# Patient Record
Sex: Female | Born: 1935 | Race: White | Hispanic: No | Marital: Married | Smoking: Former smoker
Health system: Southern US, Community
[De-identification: ages and names within clinical notes are randomized; demographics above are authoritative.]

## PROBLEM LIST (undated history)

## (undated) DIAGNOSIS — M720 Palmar fascial fibromatosis [Dupuytren]: Secondary | ICD-10-CM

## (undated) DIAGNOSIS — E669 Obesity, unspecified: Secondary | ICD-10-CM

## (undated) DIAGNOSIS — M1990 Unspecified osteoarthritis, unspecified site: Secondary | ICD-10-CM

## (undated) DIAGNOSIS — E785 Hyperlipidemia, unspecified: Secondary | ICD-10-CM

## (undated) DIAGNOSIS — N183 Chronic kidney disease, stage 3 unspecified: Secondary | ICD-10-CM

## (undated) DIAGNOSIS — F4320 Adjustment disorder, unspecified: Secondary | ICD-10-CM

## (undated) DIAGNOSIS — R05 Cough: Secondary | ICD-10-CM

## (undated) DIAGNOSIS — E7800 Pure hypercholesterolemia, unspecified: Secondary | ICD-10-CM

## (undated) DIAGNOSIS — R059 Cough, unspecified: Secondary | ICD-10-CM

## (undated) DIAGNOSIS — I10 Essential (primary) hypertension: Secondary | ICD-10-CM

## (undated) DIAGNOSIS — R002 Palpitations: Secondary | ICD-10-CM

## (undated) DIAGNOSIS — R32 Unspecified urinary incontinence: Secondary | ICD-10-CM

## (undated) DIAGNOSIS — G609 Hereditary and idiopathic neuropathy, unspecified: Secondary | ICD-10-CM

## (undated) DIAGNOSIS — K5190 Ulcerative colitis, unspecified, without complications: Secondary | ICD-10-CM

## (undated) DIAGNOSIS — G4730 Sleep apnea, unspecified: Secondary | ICD-10-CM

## (undated) DIAGNOSIS — E038 Other specified hypothyroidism: Secondary | ICD-10-CM

## (undated) DIAGNOSIS — G629 Polyneuropathy, unspecified: Secondary | ICD-10-CM

## (undated) DIAGNOSIS — R600 Localized edema: Secondary | ICD-10-CM

## (undated) DIAGNOSIS — N2581 Secondary hyperparathyroidism of renal origin: Secondary | ICD-10-CM

## (undated) DIAGNOSIS — F419 Anxiety disorder, unspecified: Secondary | ICD-10-CM

## (undated) DIAGNOSIS — H6090 Unspecified otitis externa, unspecified ear: Secondary | ICD-10-CM

## (undated) HISTORY — DX: Chronic kidney disease, stage 3 (moderate): N18.3

## (undated) HISTORY — DX: Palpitations: R00.2

## (undated) HISTORY — DX: Hyperlipidemia, unspecified: E78.5

## (undated) HISTORY — DX: Sleep apnea, unspecified: G47.30

## (undated) HISTORY — DX: Obesity, unspecified: E66.9

## (undated) HISTORY — DX: Polyneuropathy, unspecified: G62.9

## (undated) HISTORY — DX: Unspecified osteoarthritis, unspecified site: M19.90

## (undated) HISTORY — DX: Cough: R05

## (undated) HISTORY — DX: Chronic kidney disease, stage 3 unspecified: N18.30

## (undated) HISTORY — PX: COLONOSCOPY: SHX174

## (undated) HISTORY — DX: Secondary hyperparathyroidism of renal origin: N25.81

## (undated) HISTORY — DX: Anxiety disorder, unspecified: F41.9

## (undated) HISTORY — DX: Unspecified urinary incontinence: R32

## (undated) HISTORY — DX: Adjustment disorder, unspecified: F43.20

## (undated) HISTORY — DX: Other specified hypothyroidism: E03.8

## (undated) HISTORY — DX: Cough, unspecified: R05.9

## (undated) HISTORY — DX: Unspecified otitis externa, unspecified ear: H60.90

## (undated) HISTORY — DX: Hereditary and idiopathic neuropathy, unspecified: G60.9

## (undated) HISTORY — PX: REPLACEMENT TOTAL KNEE: SUR1224

## (undated) HISTORY — DX: Pure hypercholesterolemia, unspecified: E78.00

## (undated) HISTORY — DX: Essential (primary) hypertension: I10

## (undated) HISTORY — DX: Palmar fascial fibromatosis (dupuytren): M72.0

---

## 1988-02-13 HISTORY — PX: BACK SURGERY: SHX140

## 1997-02-12 HISTORY — PX: HAND SURGERY: SHX662

## 2003-10-11 ENCOUNTER — Other Ambulatory Visit: Payer: Self-pay

## 2003-11-13 ENCOUNTER — Encounter: Payer: Self-pay | Admitting: Internal Medicine

## 2003-11-16 ENCOUNTER — Encounter: Payer: Self-pay | Admitting: Unknown Physician Specialty

## 2003-12-14 ENCOUNTER — Encounter: Payer: Self-pay | Admitting: Unknown Physician Specialty

## 2005-09-03 ENCOUNTER — Encounter: Admission: RE | Admit: 2005-09-03 | Discharge: 2005-09-03 | Payer: Self-pay | Admitting: Orthopedic Surgery

## 2005-09-06 ENCOUNTER — Ambulatory Visit (HOSPITAL_BASED_OUTPATIENT_CLINIC_OR_DEPARTMENT_OTHER): Admission: RE | Admit: 2005-09-06 | Discharge: 2005-09-06 | Payer: Self-pay | Admitting: Orthopedic Surgery

## 2005-09-06 ENCOUNTER — Encounter (INDEPENDENT_AMBULATORY_CARE_PROVIDER_SITE_OTHER): Payer: Self-pay | Admitting: *Deleted

## 2006-02-06 ENCOUNTER — Other Ambulatory Visit: Payer: Self-pay

## 2006-02-19 ENCOUNTER — Inpatient Hospital Stay: Payer: Self-pay | Admitting: Unknown Physician Specialty

## 2006-02-23 ENCOUNTER — Encounter: Payer: Self-pay | Admitting: Internal Medicine

## 2006-04-02 ENCOUNTER — Other Ambulatory Visit: Payer: Self-pay

## 2006-04-02 ENCOUNTER — Inpatient Hospital Stay: Payer: Self-pay | Admitting: Internal Medicine

## 2006-09-30 ENCOUNTER — Ambulatory Visit: Payer: Self-pay | Admitting: Family Medicine

## 2006-10-03 ENCOUNTER — Ambulatory Visit: Payer: Self-pay | Admitting: Family Medicine

## 2007-05-15 ENCOUNTER — Ambulatory Visit: Payer: Self-pay | Admitting: Orthopaedic Surgery

## 2007-05-21 ENCOUNTER — Encounter: Payer: Self-pay | Admitting: Cardiovascular Disease

## 2007-05-21 LAB — CONVERTED CEMR LAB
Cholesterol: 217 mg/dL
LDL Cholesterol: 131 mg/dL
Triglyceride fasting, serum: 217 mg/dL

## 2007-06-13 HISTORY — PX: SHOULDER SURGERY: SHX246

## 2007-06-17 ENCOUNTER — Ambulatory Visit: Payer: Self-pay | Admitting: Orthopaedic Surgery

## 2007-06-24 ENCOUNTER — Ambulatory Visit: Payer: Self-pay | Admitting: Orthopaedic Surgery

## 2007-11-18 ENCOUNTER — Ambulatory Visit: Payer: Self-pay | Admitting: Family Medicine

## 2008-01-12 ENCOUNTER — Ambulatory Visit: Payer: Self-pay | Admitting: Nephrology

## 2008-03-15 HISTORY — PX: CARDIAC CATHETERIZATION: SHX172

## 2008-04-08 ENCOUNTER — Inpatient Hospital Stay (HOSPITAL_COMMUNITY): Admission: EM | Admit: 2008-04-08 | Discharge: 2008-04-11 | Payer: Self-pay | Admitting: Emergency Medicine

## 2008-10-15 ENCOUNTER — Inpatient Hospital Stay: Payer: Self-pay | Admitting: Internal Medicine

## 2008-12-16 ENCOUNTER — Ambulatory Visit: Payer: Self-pay | Admitting: Family Medicine

## 2009-02-16 ENCOUNTER — Encounter: Payer: Self-pay | Admitting: Cardiovascular Disease

## 2009-02-18 ENCOUNTER — Ambulatory Visit: Payer: Self-pay | Admitting: Family Medicine

## 2009-08-02 ENCOUNTER — Encounter: Payer: Self-pay | Admitting: Cardiovascular Disease

## 2009-08-02 DIAGNOSIS — E663 Overweight: Secondary | ICD-10-CM

## 2009-08-02 DIAGNOSIS — I10 Essential (primary) hypertension: Secondary | ICD-10-CM

## 2010-02-21 ENCOUNTER — Ambulatory Visit: Payer: Self-pay | Admitting: Family Medicine

## 2010-05-30 LAB — DIFFERENTIAL
Basophils Absolute: 0 10*3/uL (ref 0.0–0.1)
Basophils Relative: 0 % (ref 0–1)
Eosinophils Absolute: 0.4 10*3/uL (ref 0.0–0.7)
Eosinophils Relative: 4 % (ref 0–5)
Monocytes Absolute: 0.5 10*3/uL (ref 0.1–1.0)
Neutro Abs: 7.3 10*3/uL (ref 1.7–7.7)
Neutrophils Relative %: 74 % (ref 43–77)

## 2010-05-30 LAB — LIPID PANEL
Cholesterol: 147 mg/dL (ref 0–200)
HDL: 21 mg/dL — ABNORMAL LOW (ref >39)
Total CHOL/HDL Ratio: 7 RATIO
Triglycerides: 282 mg/dL — ABNORMAL HIGH (ref <150)
VLDL: 56 mg/dL — ABNORMAL HIGH (ref 0–40)

## 2010-05-30 LAB — HEPARIN LEVEL (UNFRACTIONATED): Heparin Unfractionated: <0.1 IU/mL — ABNORMAL LOW (ref 0.30–0.70)

## 2010-05-30 LAB — COMPREHENSIVE METABOLIC PANEL
Albumin: 3.8 g/dL (ref 3.5–5.2)
Alkaline Phosphatase: 69 U/L (ref 39–117)
Calcium: 9.5 mg/dL (ref 8.4–10.5)
Chloride: 101 mEq/L (ref 96–112)
GFR calc Af Amer: 43 mL/min — ABNORMAL LOW (ref >60)
GFR calc non Af Amer: 35 mL/min — ABNORMAL LOW (ref >60)
Potassium: 4.2 mEq/L (ref 3.5–5.1)
Sodium: 139 mEq/L (ref 135–145)
Total Bilirubin: 0.6 mg/dL (ref 0.3–1.2)

## 2010-05-30 LAB — CBC
HCT: 34.1 % — ABNORMAL LOW (ref 36.0–46.0)
HCT: 35.3 % — ABNORMAL LOW (ref 36.0–46.0)
HCT: 39 % (ref 36.0–46.0)
Hemoglobin: 12.2 g/dL (ref 12.0–15.0)
Hemoglobin: 12.3 g/dL (ref 12.0–15.0)
Hemoglobin: 13.2 g/dL (ref 12.0–15.0)
MCHC: 33.9 g/dL (ref 30.0–36.0)
MCHC: 34.7 g/dL (ref 30.0–36.0)
MCV: 90.5 fL (ref 78.0–100.0)
Platelets: 134 10*3/uL — ABNORMAL LOW (ref 150–400)
Platelets: 135 10*3/uL — ABNORMAL LOW (ref 150–400)
RBC: 3.97 MIL/uL (ref 3.87–5.11)
RDW: 14.5 % (ref 11.5–15.5)
RDW: 15 % (ref 11.5–15.5)
RDW: 15 % (ref 11.5–15.5)
WBC: 6 10*3/uL (ref 4.0–10.5)

## 2010-05-30 LAB — BASIC METABOLIC PANEL
BUN: 25 mg/dL — ABNORMAL HIGH (ref 6–23)
BUN: 30 mg/dL — ABNORMAL HIGH (ref 6–23)
CO2: 26 mEq/L (ref 19–32)
Creatinine, Ser: 1.63 mg/dL — ABNORMAL HIGH (ref 0.4–1.2)
Creatinine, Ser: 1.64 mg/dL — ABNORMAL HIGH (ref 0.4–1.2)
GFR calc Af Amer: 37 mL/min — ABNORMAL LOW (ref >60)
GFR calc non Af Amer: 31 mL/min — ABNORMAL LOW (ref >60)
GFR calc non Af Amer: 31 mL/min — ABNORMAL LOW (ref >60)
Glucose, Bld: 101 mg/dL — ABNORMAL HIGH (ref 70–99)
Glucose, Bld: 107 mg/dL — ABNORMAL HIGH (ref 70–99)
Glucose, Bld: 97 mg/dL (ref 70–99)
Potassium: 3.9 mEq/L (ref 3.5–5.1)
Potassium: 3.9 mEq/L (ref 3.5–5.1)
Potassium: 4.2 mEq/L (ref 3.5–5.1)
Sodium: 137 mEq/L (ref 135–145)

## 2010-05-30 LAB — T4, FREE: Free T4: 0.94 ng/dL (ref 0.89–1.80)

## 2010-05-30 LAB — TROPONIN I
Troponin I: 0.01 ng/mL (ref 0.00–0.06)
Troponin I: <0.01 ng/mL (ref 0.00–0.06)

## 2010-05-30 LAB — CK TOTAL AND CKMB (NOT AT ARMC)
CK, MB: 1 ng/mL (ref 0.3–4.0)
Total CK: 64 U/L (ref 7–177)

## 2010-05-30 LAB — GLUCOSE, CAPILLARY
Glucose-Capillary: 122 mg/dL — ABNORMAL HIGH (ref 70–99)
Glucose-Capillary: 139 mg/dL — ABNORMAL HIGH (ref 70–99)
Glucose-Capillary: 159 mg/dL — ABNORMAL HIGH (ref 70–99)

## 2010-05-30 LAB — TSH: TSH: 7.043 u[IU]/mL — ABNORMAL HIGH (ref 0.350–4.500)

## 2010-06-27 NOTE — Cardiovascular Report (Signed)
NAMELEILYNN, PILAT                ACCOUNT NO.:  [id number]   MEDICAL RECORD NO.:  [id number]          PATIENT TYPE:  INP   LOCATION:  1683                         FACILITY:  [facility]   PHYSICIAN:  Quay Burow, M.D.   DATE OF BIRTH:  Oct 07, 1930   DATE OF PROCEDURE:  04/09/2008  DATE OF DISCHARGE:                            CARDIAC CATHETERIZATION   Ms. Evinger is a delightful 77 year old mildly overweight, married white  female with history of hypertension, non-insulin-requiring diabetes, and  hyperlipidemia.  She was admitted yesterday with progressive dyspnea and  a band-like discomfort around her chest.  Her enzymes were negative.  Her creatinine was about 1.6 and she was treated with contrast  nephropathy prophylaxis.  She presents now for diagnostic coronary  arteriography to define her anatomy and rule out ischemic etiology.   DESCRIPTION OF PROCEDURE:  The patient was brought to the second floor  [facility] in the postabsorptive state.  She is  premedicated p.o. Valium.  Her right groin was prepped and shaved in the  usual sterile fashion.  Xylocaine 1% was used for local anesthesia.  A 6-  French sheath was inserted into the right femoral artery using standard  Seldinger technique.  A 6-French, right and left Judkins diagnostic  catheter as well as a 6-French pigtail catheter were used for selective  coronary angiography and left ventriculography respectively.  Visipaque  dye was used for the entirety of the case.  Retrograde aortic, left  ventricular, and pullback pressures were recorded.   HEMODYNAMICS:  1. Aortic systolic pressure 729, diastolic pressure 60.  2. Left ventricular systolic pressure 021 and end-diastolic pressure      19.   CORONARY ANGIOGRAPHY:  1. Left main normal.  2. LAD normal.  3. Left circumflex was codominant and normal.  4. Ramus branch was moderate in size and normal.  5. Right coronary artery was a codominant with an anterior  takeoff      that was normal.  6. Left ventriculography; RAO left ventriculogram was performed using      a 25 mL of Visipaque dye at 12 mL per second.  The overall LVEF was      estimated greater than 60% without focal wall motion abnormalities.      The EDP was 19.   IMPRESSION:  Ms. Beauchamp has normal coronary arteries normal left  ventricular function.  I am unclear of the etiology of her symptoms.  She will need a 2-D echo as well as chest CT.  If these are negative, we  will pursue with GI workup.   ACT was measured.  The sheath was removed.  Pressure was applied on  groin to achieve hemostasis.  The patient left the lab in stable  condition.      Quay Burow, M.D.  Electronically Signed     JB/MEDQ  D:  04/09/2008  T:  04/10/2008  Job:  [id number]   cc:   [place]

## 2010-06-27 NOTE — H&P (Signed)
NAMERAEDEN, BELZER                ACCOUNT NO.:  [id number]   MEDICAL RECORD NO.:  [id number]          PATIENT TYPE:  EMS   LOCATION:  [place]                         FACILITY:  [facility]   PHYSICIAN:  Bryson Dames, M.D.DATE OF BIRTH:  November 01, 1930   DATE OF ADMISSION:  04/08/2008  DATE OF DISCHARGE:                              HISTORY & PHYSICAL   CHIEF COMPLAINT:  Exertional chest pain and shortness of breath.   HISTORY OF PRESENT ILLNESS:  Ms. Holmes is a 77 year old female followed  by Dr. Margarita Rana in [employer].  She has a history of hypertension,  non-insulin-dependent diabetes and obesity.  The patient reported to her  primary care doctor that she had been having exertional dyspnea for the  last 3-4 days.  She also has had some substernal chest discomfort like  a band.  Sometimes it radiates to her back.  There is no jaw pain or  arm pain.  There is no associated nausea, vomiting or diaphoresis.  Her  symptoms are better with rest.  Dr. Venia Minks spoke with Dr. Einar Gip today  and the patient is now seen in [facility] ER for further evaluation.  Currently, she is symptom-free at rest.   PAST MEDICAL HISTORY:  1. Non-insulin-dependent type 2 diabetes.  2. She has obesity and is on CPAP.  3. She has treated hypertension.  4. She has had previous back surgery and knee surgery.   CURRENT MEDICATIONS:  1. Aspirin 81 mg a day.  2. Glyburide 2.5 mg a day.  3. Atenolol 25 mg a day.  4. Lexapro 10 mg a day.  5. Maxzide 37.5/25.  6. Norvasc 5 mg a day.  7. Zocor 20 mg a day.  8. Enalapril 5 mg a day.  9. Vesicare 5 mg a day.   ALLERGIES:  NO KNOWN DRUG ALLERGIES, she says either Cozaar or enalapril  gave her a cough.  The patient seems pretty sure that it was the Cozaar  and she is now on enalapril.   SOCIAL HISTORY:  She is married, her husband accompanied her to the  emergency room.  She has two children and four grandchildren.  She never  smoked.   FAMILY HISTORY:  Remarkable  in that her father died at 37 of an MI.   REVIEW OF SYSTEMS:  Essentially unremarkable except for noted above.  She denies any GI bleeding or melena.   PHYSICAL EXAMINATION:  VITAL SIGNS:  Blood pressure 130/70, pulse 57,  temperature 98.1.  GENERAL:  She is a morbidly obese female in no acute distress.  HEENT:  Normocephalic.  Extraocular movements are intact.  Sclerae is  nonicteric.  Conjunctivae within normal limits.  NECK:  Without JVD or bruit.  CHEST:  Clear to auscultation and percussion.  CARDIAC:  Reveals a regular rate and rhythm without murmur, rub or  gallop.  Normal S1-S2.  ABDOMEN:  Obese, nontender.  EXTREMITIES:  Without edema.  Distal pulses are 2+/4 bilaterally.  NEURO:  Grossly intact.  She is awake, alert, oriented and cooperative.  She moves all extremities without obvious deficit.  SKIN:  Warm  and dry.   LABORATORY DATA:  Pending.  EKG shows sinus rhythm, sinus bradycardia  without acute changes.   IMPRESSION:  1. Unstable angina with recent onset.  2. Hypertension.  3. Type 2 non-insulin dependent diabetes.  4. Obesity and sleep apnea, on continuous positive airway pressure.  5. Degenerative joint disease with a history of prior back surgery.   PLAN:  The patient is admitted to telemetry.  She will be started on IV  heparin.  She will need a diagnostic catheterization.  This will be set  up for tomorrow.      Erlene Quan, P.A.    ______________________________  Bryson Dames, M.D.    Meryl Dare  D:  04/08/2008  T:  04/08/2008  Job:  [id number]

## 2010-06-27 NOTE — Discharge Summary (Signed)
Deborah [name], Deborah [name]                ACCOUNT NO.:  [id number]   MEDICAL RECORD NO.:  [id number]          PATIENT TYPE:  INP   LOCATION:  5638                         FACILITY:  [facility]   PHYSICIAN:  Tery Sanfilippo, MD     DATE OF BIRTH:  [birth year]   DATE OF ADMISSION:  04/08/2008  DATE OF DISCHARGE:  04/11/2008                               DISCHARGE SUMMARY   DISCHARGE DIAGNOSES:  1. Chest pain, noncardiac ischemia, status post cardiac      catheterization with normal coronary arteries, normal left      ventricular function.  2. Possible gastroesophageal reflux disease or musculoskeletal chest      pain.  3. Non-insulin-dependent diabetes mellitus.  4. Hypertension.  5. Morbid obesity.  6. Obstructive sleep apnea on CPAP.  7. Abnormal CT of her chest showed no pulmonary embolism; however, she      has 6 x 3 mm nodule in the right lower lobe, followup noncontrast      chest CT in 12 months is recommended by the radiologist.  She also      had several paraesophageal lymph nodes to the left to the lower      thoracic esophagus, the largest measuring 7 mm, mildly prominent      but is nodal station.  The adjacent esophagus appeared grossly      unremarkable by the CT scan.  8. Chronic kidney disease.  9. Abnormal TSH of 7.043 with free T4 normal at 0.94.  10.Dyslipidemia.   LABORATORY DATA:  Sodium 137, potassium 3.9, chloride 101, CO2 26,  glucose 107, BUN 22, creatinine 1.55.  Hemoglobin 12.2, hematocrit 35.3,  WBCs 5.9, and platelets 150.  Free T4 0.94.  TSH 7.043.  Total  cholesterol 147, triglycerides 282, HDL 21, and LDL is 70.  CK-MBs and  troponins negative x3.   Chest x-ray showed cardiac enlargement, mild vascular congestion.   HOSPITAL COURSE:  Deborah [name] is a 77 year old female patient who came to  the hospital with complaints of exertional chest pain and shortness of  breath.  She apparently saw her primary care physician.  She has also  had exertional dyspnea over  the last 3-4 days.  Dr. Venia Minks apparently  called one of our doctors, Dr. Einar Gip, and she was told to come to [facility] for further evaluation.  She was seen, she was put on IV heparin,  and the recommendation was for her to undergo cardiac catheterization.  Her CK-MBs and troponins were all negative.  Her other labs looked  fairly normal except she has chronic kidney disease with an elevated  creatinine.  She underwent cardiac cath by Dr. Quay Burow on  April 09, 2008.  She had normal coronaries and normal LV function.  She had a codominant circ.  She had a CT scan of her chest to rule out  pulmonary emboli and this was negative, but she  did have some abnormality with recommendations for followup in 12  months.  She had a 6 x 3 mm nodule in the right lower lobe.  On April 11, 2008,  she was considered stable for discharge home.  Her blood  pressure was 123/53, O2 sat was 95% on room air, heart rate was 60,  temperature was 97.6.      Cyndia Bent, N.P.      Tery Sanfilippo, MD  Electronically Signed    BB/MEDQ  D:  04/11/2008  T:  04/11/2008  Job:  [id number]   cc:   Dr. Venia Minks

## 2010-06-30 NOTE — Op Note (Signed)
Deborah [name], Deborah [name]                ACCOUNT NO.:  [id number]   MEDICAL RECORD NO.:  [id number]          PATIENT TYPE:  AMB   LOCATION:  [place]                          FACILITY:  [facility]   PHYSICIAN:  Daryll Brod, M.D.       DATE OF BIRTH:  1930/03/20   DATE OF PROCEDURE:  09/06/2005  DATE OF DISCHARGE:                                 OPERATIVE REPORT   PREOPERATIVE DIAGNOSIS:  Dupuytren's contracture, right index, ring and  small fingers.   POSTOPERATIVE DIAGNOSIS:  Dupuytren's contracture, right index, ring and  small fingers.   OPERATION:  Excision, palmar fascia with V-Y advancements, right index, ring  and small fingers.   SURGEON:  Daryll Brod, M.D.   ASSISTANT:  R.N.   ANESTHESIA:  General.   HISTORY:  The patient is a 77 year old female with a history of Dupuytren's'  contracture with significant cord formation to the index, ring and small  fingers with PIP contractures, MP contractures of the little finger and  contractures of the index finger and a cord to the ring finger.  She is  aware of risks and complications including recurrence, extension, infection,  injury to arteries, nerves, tendons, dystrophy.  She has elected to proceed  to have the palmar fascia excised.  Questions are encouraged and answered in  the preoperative area.  The extremity is marked.   PROCEDURE:  The patient was brought to the operating room where a general  anesthetic was carried out without difficulty.  She was prepped using  DuraPrep, supine position, right arm free.  After a 3-minute dry time, she  was draped.  The limb was exsanguinated with an Esmarch bandage.  Tourniquet  placed high and the arm was inflated to 300 mmHg.  A volar Brunner type  incision was made based primarily on the little finger out to the distal  interphalangeal joint.  The incision was deepened proximally via blunt  dissection.  Palmar fascia was identified.  This allowed visualization of  the digital neurovascular  bundles.  With blunt sharp dissection, the cord  was dissected free.  Two spiral nerves were present.  A significant cord  from the abductor digiti quinti was noted.  The nerve was protected  throughout its entire course.  A large lateral digital sheath was present on  the ulnar aspect.  With protection neurovascular bundles, this was excised  way out to the distal phalangeal joint.  This allowed the finger to be fully  straightened without any further disruption of the flexor tendon sheath or  release of the joint.  The skin was then undermined to the ring finger which  allowed removal of the cord out to the level of the A2 pulley with  protection of each neurovascular bundle.  Separate incision was then made on  the index finger.  A large cord was present coming from the flexor brevis.  This was followed out.  The neurovascular bundles were identified radially  and ulnarly.  This was traced out to the level of the PIP joint; the entire  cord was excised.  The cords were  sent to pathology.  Neurovascular bundles  were explored and found to be intact.  The wound was irrigated.  The Vs were  converted to Ys.  The skin was then closed with interrupted 5-0 nylon  sutures over vessel loop double drains.  A sterile compressive dressing was  applied.  Bleeders were electrocauterized.  On deflation of the tourniquet,  all fingers  immediately pinked.  The dressing was completed.  A splint applied for  protection and comfort and the patient was taken to the recovery room for  observation in satisfactory condition.  She is discharged home to return to  the [facility] in 1 week on Vicodin.           ______________________________  Daryll Brod, M.D.     GK/MEDQ  D:  09/06/2005  T:  09/06/2005  Job:  [id number]   cc:   Margarita Rana  Fax: [phone]

## 2010-10-11 ENCOUNTER — Ambulatory Visit: Payer: Self-pay | Admitting: Family Medicine

## 2010-12-28 ENCOUNTER — Encounter: Payer: Self-pay | Admitting: Cardiovascular Disease

## 2010-12-28 ENCOUNTER — Ambulatory Visit (INDEPENDENT_AMBULATORY_CARE_PROVIDER_SITE_OTHER): Payer: Medicare PPO | Admitting: Cardiovascular Disease

## 2010-12-28 DIAGNOSIS — E663 Overweight: Secondary | ICD-10-CM

## 2010-12-28 DIAGNOSIS — R002 Palpitations: Secondary | ICD-10-CM

## 2010-12-28 DIAGNOSIS — I10 Essential (primary) hypertension: Secondary | ICD-10-CM

## 2010-12-28 DIAGNOSIS — R0602 Shortness of breath: Secondary | ICD-10-CM | POA: Insufficient documentation

## 2010-12-28 MED ORDER — FUROSEMIDE 20 MG PO TABS: 20.0000 mg | ORAL_TABLET | Freq: Every day | ORAL | Status: DC | PRN

## 2010-12-28 MED ORDER — LOSARTAN POTASSIUM 100 MG PO TABS: 100.0000 mg | ORAL_TABLET | Freq: Every day | ORAL | Status: DC

## 2010-12-28 NOTE — Patient Instructions (Addendum)
You are doing well.  When you run out of diovan, start losartan 1/2 pill once a day If your blood pressure runs high, take a full pill (or 1/2 in the morning and 1/2 in the PM)  Try the lasix as needed for shortness of breath  Please call us if you have new issues that need to be addressed

## 2010-12-28 NOTE — Assessment & Plan Note (Signed)
We have encouraged continued exercise, careful diet management in an effort to lose weight. 

## 2010-12-28 NOTE — Assessment & Plan Note (Addendum)
She has not had any further episodes of palpitations other than October 30. She describes it as a strong rhythm, not fast. I've asked her to call us if she has any more arrhythmia. We will perform a Holter monitor. This could have been secondary to profound hypertension, Possible respiratory compromise of uncertain etiology.

## 2010-12-28 NOTE — Progress Notes (Signed)
Patient ID: Deborah [name], female    DOB: [birth year], 77 y.o.   MRN: [id number]  HPI Comments: Deborah [name] is a pleasant 77 year old woman with morbid obesity, remote smoking history for 30 years who stopped 20 years ago, slight plaquing in her carotid arteries, Cardiac catheterization in 2010 showing no significant coronary artery disease, hypertension, diabetes, patient of Dr. Venia Minks who was recently seen in the office on December 13 2010 4 hypertension, shortness of breath, hypoxemia, palpitations.  The patient reports that she had very low oxygen saturation at rest and with exertion. She reports having a chest x-ray recently that was normal per her report. She wonders if she could have had a recent bronchitis or infection though now reports feeling well. She attributes her improvement to weight loss by watching what she eats. She states having an 11 pound weight loss recently and with that she has felt better.  She denies any significant lower extremity edema apart from occasionally. She does have shortness of breath with heavy exertion. She has been keeping track of her blood pressure numbers and reports it was elevated when she saw Dr. Venia Minks since then, her pressures have been well controlled, within the 734-287 range systolic.  She sees renal for mild renal dysfunction. Labs indicate GFR in the 40s. Creatinine 1.25 She would like to change the Diovan as this is very expensive for her  EKG on October 31 shows normal sinus rhythm with rate 79 beats per minute, no significant ST or T wave changes   Outpatient Encounter Prescriptions as of 12/28/2010  Medication Sig Dispense Refill  . amLODipine (NORVASC) 10 MG tablet Take 1/2 tablet twice a day.       Marland Kitchen aspirin 81 MG tablet Take 81 mg by mouth daily.        Marland Kitchen escitalopram (LEXAPRO) 10 MG tablet Take 10 mg by mouth daily.        Marland Kitchen glyBURIDE (DIABETA) 2.5 MG tablet Take 2.5 mg by mouth daily with breakfast.        . hydrochlorothiazide  (MICROZIDE) 12.5 MG capsule Take 12.5 mg by mouth daily.        Marland Kitchen imipramine (TOFRANIL) 25 MG tablet Take 25 mg by mouth 2 (two) times daily.       Marland Kitchen LORazepam (ATIVAN) 0.5 MG tablet Take 0.5 mg by mouth every 8 (eight) hours.        . meclizine (ANTIVERT) 25 MG tablet Take 25 mg by mouth 3 (three) times daily as needed.        . meloxicam (MOBIC) 7.5 MG tablet Take 7.5 mg by mouth daily.        . simvastatin (ZOCOR) 10 MG tablet Take 10 mg by mouth at bedtime.        . Vitamin D, Ergocalciferol, (DRISDOL) 50000 UNITS CAPS Take 50,000 Units by mouth every 30 (thirty) days.       Marland Kitchen DISCONTD: amLODipine (NORVASC) 5 MG tablet Take 5 mg by mouth daily.        .  valsartan (DIOVAN) 80 MG tablet Take 80 mg by mouth daily.           Review of Systems  Constitutional: Negative.   HENT: Negative.   Eyes: Negative.   Respiratory: Positive for shortness of breath.   Cardiovascular: Positive for palpitations.  Gastrointestinal: Negative.   Musculoskeletal: Negative.   Skin: Negative.   Neurological: Negative.   Hematological: Negative.   Psychiatric/Behavioral: Negative.   All other systems reviewed  and are negative.    BP 142/80  Pulse 76  Ht 5' 8"  (1.727 m)  Wt 297 lb (134.718 kg)  BMI 45.16 kg/m2   Physical Exam  Nursing note and vitals reviewed. Constitutional: She is oriented to person, place, and time. She appears well-developed and well-nourished.  HENT:  Head: Normocephalic.  Nose: Nose normal.  Mouth/Throat: Oropharynx is clear and moist.  Eyes: Conjunctivae are normal. Pupils are equal, round, and reactive to light.  Neck: Normal range of motion. Neck supple. No JVD present.  Cardiovascular: Normal rate, regular rhythm, S1 normal, S2 normal, normal heart sounds and intact distal pulses.  Exam reveals no gallop and no friction rub.   No murmur heard. Pulmonary/Chest: Effort normal and breath sounds normal. No respiratory distress. She has no wheezes. She has no rales. She  exhibits no tenderness.  Abdominal: Soft. Bowel sounds are normal. She exhibits no distension. There is no tenderness.  Musculoskeletal: Normal range of motion. She exhibits no edema and no tenderness.  Lymphadenopathy:    She has no cervical adenopathy.  Neurological: She is alert and oriented to person, place, and time. Coordination normal.  Skin: Skin is warm and dry. No rash noted. No erythema.  Psychiatric: She has a normal mood and affect. Her behavior is normal. Judgment and thought content normal.         Assessment and Plan

## 2010-12-28 NOTE — Assessment & Plan Note (Signed)
We did ambulate her in the office and her oxygen saturation at rest was 96%, this did decrease very briefly to 89% though she held predominantly at 91-92% with exertion. Her concern that she may have some component of mild COPD as she did smoke for 30 years. She also may have mild diastolic dysfunction. We have offered Lasix for her to take p.r.n. For worsening shortness of breath. She is certainly deconditioned and her obesity is likely contributing to her shortness of breath.

## 2010-12-28 NOTE — Assessment & Plan Note (Addendum)
Blood pressure by her measurements had significantly improved on her current medication regimen. She would like to change to Diovan as it is costly. We will start losartan 50 mg daily ( 100 mg cut in half). Last her to monitor her blood pressure. *continue her aggressive diet and restriction of salt.

## 2011-05-15 ENCOUNTER — Ambulatory Visit: Payer: Self-pay | Admitting: Family Medicine

## 2011-05-17 ENCOUNTER — Ambulatory Visit: Payer: Self-pay | Admitting: Family Medicine

## 2011-08-01 ENCOUNTER — Other Ambulatory Visit: Payer: Self-pay | Admitting: Gastroenterology

## 2011-08-02 LAB — WBCS, STOOL

## 2011-08-03 LAB — STOOL CULTURE

## 2011-09-13 ENCOUNTER — Ambulatory Visit: Payer: Self-pay | Admitting: Gastroenterology

## 2011-09-14 LAB — PATHOLOGY REPORT

## 2012-02-13 HISTORY — PX: EYE SURGERY: SHX253

## 2012-07-22 ENCOUNTER — Ambulatory Visit: Payer: Self-pay | Admitting: Family Medicine

## 2012-09-03 DIAGNOSIS — R339 Retention of urine, unspecified: Secondary | ICD-10-CM | POA: Insufficient documentation

## 2012-09-03 DIAGNOSIS — N3281 Overactive bladder: Secondary | ICD-10-CM | POA: Insufficient documentation

## 2012-09-03 DIAGNOSIS — N3941 Urge incontinence: Secondary | ICD-10-CM | POA: Insufficient documentation

## 2012-09-03 DIAGNOSIS — N3942 Incontinence without sensory awareness: Secondary | ICD-10-CM | POA: Insufficient documentation

## 2012-09-05 DIAGNOSIS — R350 Frequency of micturition: Secondary | ICD-10-CM | POA: Insufficient documentation

## 2012-09-08 LAB — LIPID PANEL
Cholesterol: 171 mg/dL (ref 0–200)
HDL: 42 mg/dL (ref 35–70)
LDL Cholesterol: 89 mg/dL
TRIGLYCERIDES: 202 mg/dL — AB (ref 40–160)

## 2012-09-22 ENCOUNTER — Ambulatory Visit: Payer: Self-pay | Admitting: Family Medicine

## 2012-09-22 LAB — HM DEXA SCAN: HM DEXA SCAN: NORMAL

## 2012-11-26 ENCOUNTER — Ambulatory Visit: Payer: Self-pay | Admitting: Ophthalmology

## 2012-11-26 LAB — POTASSIUM: Potassium: 4 mmol/L (ref 3.5–5.1)

## 2012-12-09 ENCOUNTER — Ambulatory Visit: Payer: Self-pay | Admitting: Ophthalmology

## 2013-01-07 ENCOUNTER — Ambulatory Visit: Payer: Self-pay | Admitting: Ophthalmology

## 2013-01-20 ENCOUNTER — Ambulatory Visit: Payer: Self-pay | Admitting: Ophthalmology

## 2013-06-16 ENCOUNTER — Ambulatory Visit: Payer: Self-pay | Admitting: Family Medicine

## 2013-09-18 LAB — CBC AND DIFFERENTIAL
HEMATOCRIT: 42 % (ref 36–46)
Hemoglobin: 13.5 g/dL (ref 12.0–16.0)
PLATELETS: 228 10*3/uL (ref 150–399)
WBC: 7.9 10^3/mL

## 2013-09-23 ENCOUNTER — Ambulatory Visit: Payer: Self-pay | Admitting: Family Medicine

## 2013-09-23 LAB — HM MAMMOGRAPHY

## 2013-10-26 ENCOUNTER — Ambulatory Visit: Payer: Self-pay | Admitting: Family Medicine

## 2013-10-29 ENCOUNTER — Ambulatory Visit: Payer: Self-pay | Admitting: Gastroenterology

## 2013-11-03 LAB — PATHOLOGY REPORT

## 2014-05-28 ENCOUNTER — Other Ambulatory Visit
Admit: 2014-05-28 | Disposition: A | Discharge status: Home / Self Care | Payer: Self-pay | Attending: Urgent Care | Admitting: Urgent Care

## 2014-05-28 LAB — COMPREHENSIVE METABOLIC PANEL
ANION GAP: 7 (ref 7–16)
AST: 17 U/L
Albumin: 3.7 g/dL
Alkaline Phosphatase: 54 U/L
BUN: 21 mg/dL — AB
Bilirubin,Total: 0.6 mg/dL
Calcium, Total: 8.9 mg/dL
Chloride: 101 mmol/L
Co2: 31 mmol/L
Creatinine: 1.29 mg/dL — ABNORMAL HIGH
GFR CALC AF AMER: 45 — AB
GFR CALC NON AF AMER: 39 — AB
Glucose: 121 mg/dL — ABNORMAL HIGH
Potassium: 3.3 mmol/L — ABNORMAL LOW
SGPT (ALT): 15 U/L
Sodium: 139 mmol/L
TOTAL PROTEIN: 6.9 g/dL

## 2014-05-28 LAB — CBC WITH DIFFERENTIAL/PLATELET
BASOS ABS: 0.1 10*3/uL (ref 0.0–0.1)
Basophil %: 0.9 %
Eosinophil #: 0.2 10*3/uL (ref 0.0–0.7)
Eosinophil %: 2.6 %
HCT: 39.9 % (ref 35.0–47.0)
HGB: 12.9 g/dL (ref 12.0–16.0)
LYMPHS ABS: 1.2 10*3/uL (ref 1.0–3.6)
Lymphocyte %: 15.8 %
MCH: 28.1 pg (ref 26.0–34.0)
MCHC: 32.3 g/dL (ref 32.0–36.0)
MCV: 87 fL (ref 80–100)
Monocyte #: 0.5 x10 3/mm (ref 0.2–0.9)
Monocyte %: 6.5 %
Neutrophil #: 5.7 10*3/uL (ref 1.4–6.5)
Neutrophil %: 74.2 %
PLATELETS: 193 10*3/uL (ref 150–440)
RBC: 4.59 10*6/uL (ref 3.80–5.20)
RDW: 15.7 % — AB (ref 11.5–14.5)
WBC: 7.6 10*3/uL (ref 3.6–11.0)

## 2014-05-28 LAB — TSH: Thyroid Stimulating Horm: 5.535 u[IU]/mL — ABNORMAL HIGH

## 2014-06-04 NOTE — Op Note (Signed)
PATIENT NAME:  Deborah [name], Deborah [name] MR#:  [id number] DATE OF BIRTH:  19-Jun-1930  DATE OF PROCEDURE:  12/09/2012  PREOPERATIVE DIAGNOSIS: Visually significant cataract of the left eye.   POSTOPERATIVE DIAGNOSIS: Visually significant cataract of the left eye.   OPERATIVE PROCEDURE: Cataract extraction by phacoemulsification with implant of intraocular lens to the left eye.   SURGEON: Birder Robson, MD.   ANESTHESIA:  1. Managed anesthesia care.  2. Topical tetracaine drops followed by 2% Xylocaine jelly applied in the preoperative holding area.   COMPLICATIONS: None.   TECHNIQUE:  Stop and chop.   DESCRIPTION OF PROCEDURE: The patient was examined and consented in the preoperative holding area where the aforementioned topical anesthesia was applied to the left eye and then brought back to the Operating Room where the left eye was prepped and draped in the usual sterile ophthalmic fashion and a lid speculum was placed. A paracentesis was created with the side port blade and the anterior chamber was filled with viscoelastic. A near clear corneal incision was performed with the steel keratome. A continuous curvilinear capsulorrhexis was performed with a cystotome followed by the capsulorrhexis forceps. Hydrodissection and hydrodelineation were carried out with BSS on a blunt cannula. The lens was removed in a stop and chop technique and the remaining cortical material was removed with the irrigation-aspiration handpiece. The capsular bag was inflated with viscoelastic and the Tecnis ZCB00 21.5-diopter lens, serial number [id number] was placed in the capsular bag without complication. The remaining viscoelastic was removed from the eye with the irrigation-aspiration handpiece. The wounds were hydrated. The anterior chamber was flushed with Miostat and the eye was inflated to physiologic pressure. 0.1 mL of cefuroxime concentration 10 mg/mL was placed in the anterior chamber. The wounds were found to be  water tight. The eye was dressed with Vigamox. The patient was given protective glasses to wear throughout the day and a shield with which to sleep tonight. The patient was also given drops with which to begin a drop regimen today and will follow-up with me in one day.   ____________________________ Livingston Diones. Sergei Delo, MD wlp:gb D: 12/09/2012 18:40:46 ET T: 12/09/2012 19:31:09 ET JOB#: [id number]  cc: Elan Brainerd L. Yamna Mackel, MD, <Dictator> Livingston Diones Malekai Markwood MD ELECTRONICALLY SIGNED 12/15/2012 10:30

## 2014-06-04 NOTE — Op Note (Signed)
PATIENT NAME:  Deborah [name], Deborah [name] MR#:  [id number] DATE OF BIRTH:  1930/05/12  DATE OF PROCEDURE:  01/20/2013  PREOPERATIVE DIAGNOSIS: Visually significant cataract of the right eye.   POSTOPERATIVE DIAGNOSIS: Visually significant cataract of the right eye.   OPERATIVE PROCEDURE: Cataract extraction by phacoemulsification with implant of intraocular lens to right eye.   SURGEON: Birder Robson, MD.   ANESTHESIA:  1. Managed anesthesia care.  2. Topical tetracaine drops followed by 2% Xylocaine jelly applied in the preoperative holding area.   COMPLICATIONS: None.   TECHNIQUE: Stop and chop.  DESCRIPTION OF PROCEDURE: The patient was examined and consented in the preoperative holding area where the aforementioned topical anesthesia was applied to the right eye and then brought back to the operating room where the right eye was prepped and draped in the usual sterile ophthalmic fashion and a lid speculum was placed. A paracentesis was created with the side port blade and the anterior chamber was filled with viscoelastic. A near clear corneal incision was performed with the steel keratome. A continuous curvilinear capsulorrhexis was performed with a cystotome followed by the capsulorrhexis forceps. Hydrodissection and hydrodelineation were carried out with BSS on a blunt cannula. The lens was removed in a stop and chop technique and the remaining cortical material was removed with the irrigation-aspiration handpiece. The capsular bag was inflated with viscoelastic and the Tecnis ZCB00 22.5-diopter lens, serial number [id number], was placed in the capsular bag without complication. The remaining viscoelastic was removed from the eye with the irrigation-aspiration handpiece. The wounds were hydrated. The anterior chamber was flushed with Miostat and the eye was inflated to physiologic pressure. 0.1 mL of cefuroxime concentration 10 mg/mL was placed in the anterior chamber. The wounds were found to be  water tight. The eye was dressed with Vigamox. The patient was given protective glasses to wear throughout the day and a shield with which to sleep tonight. The patient was also given drops with which to begin a drop regimen today and will follow-up with me in one day.    ____________________________ Livingston Diones. Esco Joslyn, MD wlp:jcm D: 01/20/2013 22:09:49 ET T: 01/20/2013 22:45:11 ET JOB#: [id number]  cc: Angeliz Settlemyre L. Tod Abrahamsen, MD, <Dictator> Livingston Diones Tahjir Silveria MD ELECTRONICALLY SIGNED 01/21/2013 12:09

## 2014-06-10 LAB — BASIC METABOLIC PANEL
BUN: 21 mg/dL (ref 4–21)
CREATININE: 1.4 mg/dL — AB (ref <=1.1)
Glucose: 160 mg/dL
Potassium: 4 mmol/L (ref 3.4–5.3)
Sodium: 143 mmol/L (ref 137–147)

## 2014-06-10 LAB — HEMOGLOBIN A1C: Hgb A1c MFr Bld: 6.1 % — AB (ref 4.0–6.0)

## 2014-06-10 LAB — TSH: TSH: 4.04 u[IU]/mL (ref <=5.90)

## 2014-06-10 LAB — HEPATIC FUNCTION PANEL
ALT: 23 U/L (ref 7–35)
AST: 15 U/L (ref 13–35)

## 2014-07-15 ENCOUNTER — Telehealth: Payer: Self-pay | Admitting: Family Medicine

## 2014-07-15 NOTE — Telephone Encounter (Signed)
Spoke with Pt. She needs OV.  I have already told her to come in 07/16/2014 at 2:30.  Please add her to the schedule.    Thanks,   -Mickel Baas

## 2014-07-15 NOTE — Telephone Encounter (Signed)
Pt. [place] a week ago pt. Wants advise if she needs to see Dr.   Inez Catalina [phone]

## 2014-07-16 ENCOUNTER — Ambulatory Visit (INDEPENDENT_AMBULATORY_CARE_PROVIDER_SITE_OTHER): Payer: Medicare Other | Admitting: Family Medicine

## 2014-07-16 ENCOUNTER — Encounter: Payer: Self-pay | Admitting: Family Medicine

## 2014-07-16 ENCOUNTER — Other Ambulatory Visit: Payer: Self-pay

## 2014-07-16 VITALS — BP 120/68 | HR 68 | Temp 98.7°F | Resp 16 | Ht 68.0 in | Wt 324.0 lb

## 2014-07-16 DIAGNOSIS — K5900 Constipation, unspecified: Secondary | ICD-10-CM | POA: Insufficient documentation

## 2014-07-16 DIAGNOSIS — N2581 Secondary hyperparathyroidism of renal origin: Secondary | ICD-10-CM | POA: Insufficient documentation

## 2014-07-16 DIAGNOSIS — G4730 Sleep apnea, unspecified: Secondary | ICD-10-CM | POA: Insufficient documentation

## 2014-07-16 DIAGNOSIS — N183 Chronic kidney disease, stage 3 unspecified: Secondary | ICD-10-CM | POA: Insufficient documentation

## 2014-07-16 DIAGNOSIS — M5430 Sciatica, unspecified side: Secondary | ICD-10-CM | POA: Insufficient documentation

## 2014-07-16 DIAGNOSIS — R197 Diarrhea, unspecified: Secondary | ICD-10-CM | POA: Insufficient documentation

## 2014-07-16 DIAGNOSIS — I10 Essential (primary) hypertension: Secondary | ICD-10-CM | POA: Insufficient documentation

## 2014-07-16 DIAGNOSIS — M720 Palmar fascial fibromatosis [Dupuytren]: Secondary | ICD-10-CM | POA: Insufficient documentation

## 2014-07-16 DIAGNOSIS — K52832 Lymphocytic colitis: Secondary | ICD-10-CM | POA: Insufficient documentation

## 2014-07-16 DIAGNOSIS — M25562 Pain in left knee: Secondary | ICD-10-CM

## 2014-07-16 DIAGNOSIS — K529 Noninfective gastroenteritis and colitis, unspecified: Secondary | ICD-10-CM | POA: Insufficient documentation

## 2014-07-16 DIAGNOSIS — J309 Allergic rhinitis, unspecified: Secondary | ICD-10-CM | POA: Insufficient documentation

## 2014-07-16 DIAGNOSIS — R32 Unspecified urinary incontinence: Secondary | ICD-10-CM | POA: Insufficient documentation

## 2014-07-16 DIAGNOSIS — E119 Type 2 diabetes mellitus without complications: Secondary | ICD-10-CM | POA: Insufficient documentation

## 2014-07-16 DIAGNOSIS — M25511 Pain in right shoulder: Secondary | ICD-10-CM | POA: Diagnosis not present

## 2014-07-16 DIAGNOSIS — G64 Other disorders of peripheral nervous system: Secondary | ICD-10-CM | POA: Insufficient documentation

## 2014-07-16 DIAGNOSIS — R159 Full incontinence of feces: Secondary | ICD-10-CM | POA: Insufficient documentation

## 2014-07-16 DIAGNOSIS — R0902 Hypoxemia: Secondary | ICD-10-CM | POA: Insufficient documentation

## 2014-07-16 DIAGNOSIS — M1990 Unspecified osteoarthritis, unspecified site: Secondary | ICD-10-CM | POA: Insufficient documentation

## 2014-07-16 DIAGNOSIS — E7800 Pure hypercholesterolemia, unspecified: Secondary | ICD-10-CM | POA: Insufficient documentation

## 2014-07-16 DIAGNOSIS — E039 Hypothyroidism, unspecified: Secondary | ICD-10-CM | POA: Insufficient documentation

## 2014-07-16 DIAGNOSIS — E668 Other obesity: Secondary | ICD-10-CM | POA: Insufficient documentation

## 2014-07-16 DIAGNOSIS — F419 Anxiety disorder, unspecified: Secondary | ICD-10-CM | POA: Insufficient documentation

## 2014-07-16 DIAGNOSIS — E038 Other specified hypothyroidism: Secondary | ICD-10-CM | POA: Insufficient documentation

## 2014-07-16 NOTE — Progress Notes (Signed)
Subjective:    Patient ID: Deborah [name], female    DOB: Dec 10, 1930, 81 y.o.   MRN: [id number]  HPI Comments: Pt also requesting refill on Meclizine.  Knee Pain  The incident occurred more than 1 week ago. The incident occurred at home. The injury mechanism was a fall. The pain is present in the left leg, left hip and left knee. The quality of the pain is described as shooting (while standing on affected leg). The pain is at a severity of 7/10. The pain is moderate. The pain has been intermittent since onset. Associated symptoms include an inability to bear weight, a loss of motion, muscle weakness, numbness (possibly secondary to neuropathy) and tingling (possibly secondary to neuropathy). Pertinent negatives include no loss of sensation. She reports no foreign bodies present. The symptoms are aggravated by weight bearing. She has tried acetaminophen for the symptoms. The treatment provided moderate relief.   Right shoulder is also sore.   Has history of bilateral knee replacements.      Review of Systems  Constitutional: Negative for fever, chills, diaphoresis, appetite change, fatigue and unexpected weight change.  Musculoskeletal: Positive for myalgias, arthralgias and gait problem. Negative for back pain, joint swelling, neck pain and neck stiffness.  Neurological: Positive for tingling (possibly secondary to neuropathy) and numbness (possibly secondary to neuropathy).   Past Medical History  Diagnosis Date  . Hyperlipidemia   . Diabetes mellitus   . Palpitations   . Hypertension   . Osteoarthritis   . Contracture of palmar fascia   . Cough   . Anxiety   . Otitis externa   . Unspecified adjustment reaction   . Unspecified urinary incontinence   . Secondary hyperparathyroidism (of renal origin)   . Chronic kidney disease, stage III (moderate)   . Pure hypercholesterolemia   . Other specified acquired hypothyroidism   . Unspecified hereditary and idiopathic peripheral  neuropathy   . Peripheral neuropathy   . Obesity   . Sleep apnea    Past Surgical History  Procedure Laterality Date  . Replacement total knee Bilateral   . Hand surgery Right   . Back surgery  1990    Ruptured Disc.  Marland Kitchen Shoulder surgery Right 06/2007  . Colonoscopy    . Cardiac catheterization  03/2008    reports that she quit smoking about 16 years ago. Her smoking use included Cigarettes. She has a 10 pack-year smoking history. She does not have any smokeless tobacco history on file. She reports that she does not drink alcohol or use illicit drugs. family history includes Alzheimer's disease in her mother; Heart attack in her father; Hypertension in her sister and sister. Allergies  Allergen Reactions  . Ace Inhibitors     cough       Objective:   Physical Exam  Constitutional: She appears well-developed.  Cardiovascular: Normal rate and regular rhythm.   Pulmonary/Chest: Effort normal and breath sounds normal.  Musculoskeletal: She exhibits edema (of her knees, left greater than right. ) and tenderness (in both knees and buttocks on left and right shoulder. ).       Arms:      Legs: Skin:  Large hematoma on left buttocks and abrasion on right knee.     BP 120/68 mmHg  Pulse 68  Temp(Src) 98.7 F (37.1 C) (Oral)  Resp 16  Ht 5' 8"  (1.727 m)  Wt 324 lb (146.965 kg)  BMI 49.28 kg/m2        Assessment & Plan:  1. Knee pain, acute, left  Worsening. Will refer to Dr. Jefm Bryant to evaluate and treat. He did her previous knee surgeries.  2. Shoulder pain, right  Follow up if does not continue to improve.  Continue to do ROM for shoulder. Therapy referral if does not improve.

## 2014-07-19 ENCOUNTER — Other Ambulatory Visit: Payer: Self-pay | Admitting: Family Medicine

## 2014-07-19 DIAGNOSIS — I10 Essential (primary) hypertension: Secondary | ICD-10-CM

## 2014-07-19 DIAGNOSIS — F419 Anxiety disorder, unspecified: Secondary | ICD-10-CM

## 2014-07-21 DIAGNOSIS — M25569 Pain in unspecified knee: Secondary | ICD-10-CM | POA: Insufficient documentation

## 2014-07-21 DIAGNOSIS — Z9889 Other specified postprocedural states: Secondary | ICD-10-CM | POA: Insufficient documentation

## 2014-07-28 ENCOUNTER — Other Ambulatory Visit: Payer: Self-pay | Admitting: Family Medicine

## 2014-07-28 DIAGNOSIS — F419 Anxiety disorder, unspecified: Secondary | ICD-10-CM

## 2014-08-05 DIAGNOSIS — M25519 Pain in unspecified shoulder: Secondary | ICD-10-CM | POA: Insufficient documentation

## 2014-08-20 ENCOUNTER — Other Ambulatory Visit: Payer: Self-pay | Admitting: Family Medicine

## 2014-08-20 DIAGNOSIS — M1990 Unspecified osteoarthritis, unspecified site: Secondary | ICD-10-CM

## 2014-11-18 ENCOUNTER — Other Ambulatory Visit: Payer: Self-pay | Admitting: Family Medicine

## 2014-11-18 DIAGNOSIS — J309 Allergic rhinitis, unspecified: Secondary | ICD-10-CM

## 2014-12-17 ENCOUNTER — Encounter: Payer: Self-pay | Admitting: Family Medicine

## 2014-12-17 ENCOUNTER — Ambulatory Visit (INDEPENDENT_AMBULATORY_CARE_PROVIDER_SITE_OTHER): Payer: Medicare Other | Admitting: Family Medicine

## 2014-12-17 ENCOUNTER — Other Ambulatory Visit: Payer: Self-pay | Admitting: Family Medicine

## 2014-12-17 VITALS — BP 138/74 | HR 92 | Temp 98.1°F | Resp 18

## 2014-12-17 DIAGNOSIS — E119 Type 2 diabetes mellitus without complications: Secondary | ICD-10-CM

## 2014-12-17 DIAGNOSIS — N183 Chronic kidney disease, stage 3 unspecified: Secondary | ICD-10-CM

## 2014-12-17 DIAGNOSIS — I10 Essential (primary) hypertension: Secondary | ICD-10-CM

## 2014-12-17 DIAGNOSIS — E038 Other specified hypothyroidism: Secondary | ICD-10-CM

## 2014-12-17 DIAGNOSIS — E663 Overweight: Secondary | ICD-10-CM | POA: Diagnosis not present

## 2014-12-17 DIAGNOSIS — Z23 Encounter for immunization: Secondary | ICD-10-CM

## 2014-12-17 DIAGNOSIS — M25511 Pain in right shoulder: Secondary | ICD-10-CM

## 2014-12-17 DIAGNOSIS — N3942 Incontinence without sensory awareness: Secondary | ICD-10-CM

## 2014-12-17 DIAGNOSIS — E039 Hypothyroidism, unspecified: Secondary | ICD-10-CM

## 2014-12-17 DIAGNOSIS — F419 Anxiety disorder, unspecified: Secondary | ICD-10-CM | POA: Diagnosis not present

## 2014-12-17 DIAGNOSIS — R319 Hematuria, unspecified: Secondary | ICD-10-CM

## 2014-12-17 DIAGNOSIS — N390 Urinary tract infection, site not specified: Secondary | ICD-10-CM | POA: Diagnosis not present

## 2014-12-17 DIAGNOSIS — M25519 Pain in unspecified shoulder: Secondary | ICD-10-CM | POA: Insufficient documentation

## 2014-12-17 LAB — POCT URINALYSIS DIPSTICK
Bilirubin, UA: NEGATIVE
GLUCOSE UA: NEGATIVE
Ketones, UA: NEGATIVE
Nitrite, UA: POSITIVE
PH UA: 6
Protein, UA: NEGATIVE
SPEC GRAV UA: 1.015
Urobilinogen, UA: NEGATIVE

## 2014-12-17 LAB — POCT UA - MICROSCOPIC ONLY: WBC, Ur, HPF, POC: 15

## 2014-12-17 MED ORDER — ESCITALOPRAM OXALATE 20 MG PO TABS: 20.0000 mg | ORAL_TABLET | Freq: Every day | ORAL | Status: DC

## 2014-12-17 NOTE — Progress Notes (Signed)
Patient ID: Deborah [name], female   DOB: 1930/04/12, 81 y.o.   MRN: [id number]    Subjective:  HPI  Patient is here to follow up on her kidneys.  Patient states she use to see Dr. Holley Raring but she can be just followed here for now as she has been stable. Does have some frequency and incontinence at baseline  She also would like to get her urine checked due to having dark color to it but no discomfort or any other issues related to this. Taking all of her other medication without any difficulty.    Is using her CPAP at night. Having some dry mouth in the am.    Mood is also not good. Taking her medication but just no motivation to do much. Does not like to go out like she used to. She is just not herself.  Not motivated to do stuff at home either. Family members have noticed a change. Would really like to feel better before the holidays.    Prior to Admission medications   Medication Sig Start Date End Date Taking? Authorizing Provider  amLODipine (NORVASC) 10 MG tablet Take 1/2 tablet twice a day.    Yes Historical Provider, MD  aspirin 81 MG tablet Take 81 mg by mouth daily.     Yes Historical Provider, MD  budesonide (ENTOCORT EC) 3 MG 24 hr capsule  12/24/13  Yes Historical Provider, MD  Cetirizine HCl 10 MG CAPS Take by mouth. 06/15/13  Yes Historical Provider, MD  cholestyramine Lucrezia Starch) 4 G packet Take by mouth.   Yes Historical Provider, MD  escitalopram (LEXAPRO) 10 MG tablet Take 10 mg by mouth daily.     Yes Historical Provider, MD  escitalopram (LEXAPRO) 20 MG tablet TAKE 1 TABLET BY MOUTH ONCE A DAY 07/19/14  Yes Margarita Rana, MD  glipiZIDE (GLUCOTROL) 5 MG tablet Take by mouth. 05/14/14  Yes Historical Provider, MD  glyBURIDE (DIABETA) 2.5 MG tablet Take 2.5 mg by mouth daily with breakfast.     Yes Historical Provider, MD  hydrochlorothiazide (MICROZIDE) 12.5 MG capsule TAKE 1 CAPSULE BY MOUTH ONCE DAILY 07/19/14  Yes Margarita Rana, MD  imipramine (TOFRANIL) 25 MG tablet Take 25 mg by  mouth 2 (two) times daily.    Yes Historical Provider, MD  LORazepam (ATIVAN) 0.5 MG tablet TAKE 1 TABLET BY MOUTH TWICE A DAY 07/29/14  Yes Margarita Rana, MD  meclizine (ANTIVERT) 25 MG tablet Take 25 mg by mouth 3 (three) times daily as needed.     Yes Historical Provider, MD  meloxicam (MOBIC) 7.5 MG tablet TAKE 1 TABLET BY MOUTH ONCE A DAY 08/20/14  Yes Margarita Rana, MD  montelukast (SINGULAIR) 10 MG tablet TAKE 1 TABLET BY MOUTH ONCE A DAY 11/18/14  Yes Margarita Rana, MD  oxybutynin (DITROPAN-XL) 10 MG 24 hr tablet Take by mouth. 02/03/14  Yes Historical Provider, MD  simvastatin (ZOCOR) 20 MG tablet  06/07/14  Yes Historical Provider, MD  Vitamin D, Ergocalciferol, (DRISDOL) 50000 UNITS CAPS Take 50,000 Units by mouth every 30 (thirty) days.    Yes Historical Provider, MD  furosemide (LASIX) 20 MG tablet Take 1 tablet (20 mg total) by mouth daily as needed. 12/28/10 12/28/11  Minna Merritts, MD  losartan (COZAAR) 100 MG tablet Take 1 tablet (100 mg total) by mouth daily. 12/28/10 12/28/11  Minna Merritts, MD    Patient Active Problem List   Diagnosis Date Noted  . Allergic rhinitis 07/16/2014  . Anxiety 07/16/2014  .  Benign hypertension 07/16/2014  . Alteration in bowel elimination: incontinence 07/16/2014  . Chronic kidney disease (CKD), stage III (moderate) 07/16/2014  . Colitis 07/16/2014  . CN (constipation) 07/16/2014  . D (diarrhea) 07/16/2014  . Contracture of palmar fascia (Dupuytren's) 07/16/2014  . Hypercholesteremia 07/16/2014  . Hypoxia 07/16/2014  . Absence of bladder continence 07/16/2014  . Focal lymphocytic colitis 07/16/2014  . Extreme obesity ([place]) 07/16/2014  . Arthritis, degenerative 07/16/2014  . Disorder of peripheral nervous system ([place]) 07/16/2014  . Neuralgia neuritis, sciatic nerve 07/16/2014  . Hyperparathyroidism, secondary ([place]) 07/16/2014  . Apnea, sleep 07/16/2014  . Subclinical hypothyroidism 07/16/2014  . Diabetes mellitus, type 2 ([place])  07/16/2014  . FOM (frequency of micturition) 09/05/2012  . 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 Incomplete bladder emptying 09/03/2012  . Urge incontinence 09/03/2012  . Urinary incontinence without sensory awareness 09/03/2012  . Palpitations 12/28/2010  . SOB (shortness of breath) 12/28/2010  . OVERWEIGHT/OBESITY 08/02/2009  . Essential hypertension 08/02/2009    Past Medical History  Diagnosis Date  . Hyperlipidemia   . Diabetes mellitus   . Palpitations   . Hypertension   . Osteoarthritis   . Contracture of palmar fascia   . Cough   . Anxiety   . Otitis externa   . Unspecified adjustment reaction   . Unspecified urinary incontinence   . Secondary hyperparathyroidism (of renal origin)   . Chronic kidney disease, stage III (moderate)   . Pure hypercholesterolemia   . Other specified acquired hypothyroidism   . Unspecified hereditary and idiopathic peripheral neuropathy   . Peripheral neuropathy ([place])   . Obesity   . Sleep apnea     Social History   Social History  . Marital Status: Married    Spouse Name: N/A  . Number of Children: 2  . Years of Education: N/A   Occupational History  . Retired [employer]    Worked in Proofreader.    Social History Main Topics  . Smoking status: Former Smoker -- 10.00 packs/day for 1 years    Types: Cigarettes    Quit date: 12/27/1997  . Smokeless tobacco: Never Used  . Alcohol Use: No  . Drug Use: No  . Sexual Activity: No   Other Topics Concern  . Not on file   Social History Narrative    Allergies  Allergen Reactions  . Ace Inhibitors     cough    Review of Systems  Constitutional: Negative.   Respiratory: Negative.   Cardiovascular: Positive for leg swelling. Negative for chest pain and palpitations.  Musculoskeletal: Positive for myalgias (right shoulder pain.) and joint pain.    Immunization History  Administered Date(s) Administered  . Pneumococcal Conjugate-13 11/16/2013  . Pneumococcal  Polysaccharide-23 11/12/2007  . Zoster 07/27/2010   Objective:  BP 138/74 mmHg  Pulse 92  Temp(Src) 98.1 F (36.7 C)  Resp 18  Wt   Physical Exam  Constitutional: She is oriented to person, place, and time and well-developed, well-nourished, and in no distress.  HENT:  Head: Normocephalic and atraumatic.  Right Ear: External ear normal.  Left Ear: External ear normal.  Mouth/Throat: Oropharynx is clear and moist.  Cardiovascular: Normal rate and regular rhythm.   Pulmonary/Chest: Effort normal and breath sounds normal.  Abdominal: Soft.  Neurological: She is alert and oriented to person, place, and time.  Psychiatric: Mood, memory, affect and judgment normal.    Lab Results  Component Value Date   WBC 7.6 05/28/2014   HGB 12.9 05/28/2014  HCT 39.9 05/28/2014   PLT 193 05/28/2014   GLUCOSE 121* 05/28/2014   CHOL 171 09/08/2012   TRIG 202* 09/08/2012   HDL 42 09/08/2012   LDLCALC 89 09/08/2012   TSH 4.04 06/10/2014   INR 1.0 04/08/2008   HGBA1C 6.1* 06/10/2014    CMP     Component Value Date/Time   NA 143 06/10/2014   NA 139 05/28/2014 1023   NA 137 04/11/2008 0810   K 4.0 06/10/2014   K 3.3* 05/28/2014 1023   CL 101 05/28/2014 1023   CL 101 04/11/2008 0810   CO2 31 05/28/2014 1023   CO2 26 04/11/2008 0810   GLUCOSE 121* 05/28/2014 1023   GLUCOSE 107* 04/11/2008 0810   BUN 21 06/10/2014   BUN 21* 05/28/2014 1023   BUN 22 04/11/2008 0810   CREATININE 1.4* 06/10/2014   CREATININE 1.29* 05/28/2014 1023   CREATININE 1.55* 04/11/2008 0810   CALCIUM 8.9 05/28/2014 1023   CALCIUM 8.8 04/11/2008 0810   PROT 6.9 05/28/2014 1023   PROT 7.3 04/08/2008 1747   ALBUMIN 3.7 05/28/2014 1023   ALBUMIN 3.8 04/08/2008 1747   AST 15 06/10/2014   AST 17 05/28/2014 1023   ALT 23 06/10/2014   ALT 15 05/28/2014 1023   ALKPHOS 54 05/28/2014 1023   ALKPHOS 69 04/08/2008 1747   BILITOT 0.6 05/28/2014 1023   BILITOT 0.6 04/08/2008 1747   GFRNONAA 39* 05/28/2014 1023    GFRNONAA 33* 04/11/2008 0810   GFRAA 45* 05/28/2014 1023   GFRAA * 04/11/2008 0810    39        The eGFR has been calculated using the MDRD equation. This calculation has not been validated in all clinical situations. eGFR's persistently <60 mL/min signify possible Chronic Kidney Disease.    Assessment and Plan :  1. Chronic kidney disease (CKD), stage III (moderate) Stable. Will check labs.   - POCT urinalysis dipstick - Comprehensive metabolic panel - CBC with Differential/Platelet Results for orders placed or performed in visit on 12/17/14  POCT urinalysis dipstick  Result Value Ref Range   Color, UA dark yellow    Clarity, UA clear    Glucose, UA negative    Bilirubin, UA negative    Ketones, UA negative    Spec Grav, UA 1.015    Blood, UA non hemolyzed moderate    pH, UA 6.0    Protein, UA negative    Urobilinogen, UA negative    Nitrite, UA positive    Leukocytes, UA small (1+) (A) Negative  POCT UA - Microscopic Only  Result Value Ref Range   WBC, Ur, HPF, POC 15    RBC, urine, microscopic na    Bacteria, U Microscopic large    Mucus, UA na    Epithelial cells, urine per micros 5-10    Crystals, Ur, HPF, POC na    Casts, Ur, LPF, POC na    Yeast, UA na     2. Overweight Unchanged.   3. Urinary incontinence without sensory awareness Unchanged.  Will stop Ditropan secondary to dry mouth.   - POCT urinalysis dipstick  4. Needs flu shot Given today.  - Flu vaccine HIGH DOSE PF (Fluzone High dose)  5. Type 2 diabetes mellitus without complication, without long-term current use of insulin (HCC) Stable. Will check labs.  - Hemoglobin A1c  6. Subclinical hypothyroidism Needs labs checked.  - TSH  7. Benign hypertension Condition is stable. Please continue current medication and  plan of care as  noted.    8. Right shoulder pain Unchanged. Will refer to PT if patient request.   9. Anxiety Worsening. Will increase Lexapro to 20 mg. And recheck in 6  weeks.   - escitalopram (LEXAPRO) 20 MG tablet; Take 1 tablet (20 mg total) by mouth daily.  Dispense: 30 tablet; Refill: 5  10. Urinary tract infection with hematuria, site unspecified Will send for culture   - POCT UA - Microscopic Only - Urine Culture  12/17/2014 3:10 PM

## 2014-12-20 LAB — URINE CULTURE

## 2014-12-20 LAB — PLEASE NOTE

## 2014-12-21 ENCOUNTER — Telehealth: Payer: Self-pay

## 2014-12-21 LAB — CBC WITH DIFFERENTIAL/PLATELET
BASOS ABS: 0 10*3/uL (ref 0.0–0.2)
Basos: 0 %
EOS (ABSOLUTE): 0.2 10*3/uL (ref 0.0–0.4)
Eos: 2 %
Hematocrit: 42.3 % (ref 34.0–46.6)
Hemoglobin: 13.9 g/dL (ref 11.1–15.9)
IMMATURE GRANULOCYTES: 0 %
Immature Grans (Abs): 0 10*3/uL (ref 0.0–0.1)
Lymphocytes Absolute: 1.6 10*3/uL (ref 0.7–3.1)
Lymphs: 18 %
MCH: 28.3 pg (ref 26.6–33.0)
MCHC: 32.9 g/dL (ref 31.5–35.7)
MCV: 86 fL (ref 79–97)
MONOS ABS: 0.5 10*3/uL (ref 0.1–0.9)
Monocytes: 5 %
Neutrophils Absolute: 6.5 10*3/uL (ref 1.4–7.0)
Neutrophils: 75 %
PLATELETS: 219 10*3/uL (ref 150–379)
RBC: 4.91 x10E6/uL (ref 3.77–5.28)
RDW: 14.9 % (ref 12.3–15.4)
WBC: 8.7 10*3/uL (ref 3.4–10.8)

## 2014-12-21 LAB — COMPREHENSIVE METABOLIC PANEL
ALT: 15 IU/L (ref 0–32)
AST: 15 IU/L (ref 0–40)
Albumin/Globulin Ratio: 1.5 (ref 1.1–2.5)
Albumin: 3.8 g/dL (ref 3.5–4.7)
Alkaline Phosphatase: 66 IU/L (ref 39–117)
BUN/Creatinine Ratio: 17 (ref 11–26)
BUN: 19 mg/dL (ref 8–27)
Bilirubin Total: 0.3 mg/dL (ref 0.0–1.2)
CHLORIDE: 97 mmol/L (ref 97–106)
CO2: 31 mmol/L — ABNORMAL HIGH (ref 18–29)
Calcium: 9.7 mg/dL (ref 8.7–10.3)
Creatinine, Ser: 1.12 mg/dL — ABNORMAL HIGH (ref 0.57–1.00)
GFR calc Af Amer: 53 mL/min/{1.73_m2} — ABNORMAL LOW (ref >59)
GFR calc non Af Amer: 46 mL/min/{1.73_m2} — ABNORMAL LOW (ref >59)
Globulin, Total: 2.5 g/dL (ref 1.5–4.5)
Glucose: 102 mg/dL — ABNORMAL HIGH (ref 65–99)
Potassium: 3.6 mmol/L (ref 3.5–5.2)
SODIUM: 143 mmol/L (ref 136–144)
TOTAL PROTEIN: 6.3 g/dL (ref 6.0–8.5)

## 2014-12-21 LAB — HEMOGLOBIN A1C
ESTIMATED AVERAGE GLUCOSE: 128 mg/dL
Hgb A1c MFr Bld: 6.1 % — ABNORMAL HIGH (ref 4.8–5.6)

## 2014-12-21 LAB — TSH: TSH: 3.7 u[IU]/mL (ref 0.450–4.500)

## 2014-12-21 NOTE — Telephone Encounter (Signed)
-----   Message from Margarita Rana, MD sent at 12/21/2014  2:32 PM EST ----- Labs stable. PLease notify patient. Thanks.

## 2014-12-21 NOTE — Telephone Encounter (Signed)
LMBTC 12/21/2014  Thanks,   -Mickel Baas

## 2014-12-21 NOTE — Telephone Encounter (Signed)
Pt advised; she reports not having any urinary symptoms.   Thanks,   -Mickel Baas

## 2014-12-21 NOTE — Telephone Encounter (Signed)
-----   Message from Margarita Rana, MD sent at 12/21/2014  2:34 PM EST ----- Urine does have bacteria. Will treat with Cipro if any symptoms.   Thanks.

## 2014-12-31 ENCOUNTER — Encounter: Payer: Self-pay | Admitting: Family Medicine

## 2014-12-31 ENCOUNTER — Ambulatory Visit: Payer: Self-pay | Admitting: Family Medicine

## 2014-12-31 ENCOUNTER — Ambulatory Visit (INDEPENDENT_AMBULATORY_CARE_PROVIDER_SITE_OTHER): Payer: Medicare Other | Admitting: Family Medicine

## 2014-12-31 VITALS — BP 106/62 | HR 92 | Temp 98.7°F | Resp 20

## 2014-12-31 DIAGNOSIS — N390 Urinary tract infection, site not specified: Secondary | ICD-10-CM | POA: Diagnosis not present

## 2014-12-31 DIAGNOSIS — R319 Hematuria, unspecified: Secondary | ICD-10-CM

## 2014-12-31 LAB — POCT URINALYSIS DIPSTICK
BILIRUBIN UA: NEGATIVE
Glucose, Ur: NEGATIVE
Nitrite, UA: POSITIVE
SPEC GRAV UA: 1.015
Urobilinogen, UA: 0.2
pH, UA: 6.5

## 2014-12-31 MED ORDER — CIPROFLOXACIN HCL 500 MG PO TABS: 500.0000 mg | ORAL_TABLET | Freq: Two times a day (BID) | ORAL | Status: AC

## 2014-12-31 NOTE — Progress Notes (Signed)
Patient ID: Deborah [name], female   DOB: [birth year], 81 y.o.   MRN: [id number]       Patient: Deborah [name] Female    DOB: July 23, 1930   81 y.o.   MRN: [id number] Visit Date: 12/31/2014  Today's Provider: Lelon Huh, MD   Chief Complaint  Patient presents with  . Chills   Subjective:    HPI Chills and weakness Patient reports that she has had chills ever since yesterday afternoon. She reports that they came on all of a sudden. Patient has checked her temp and it was around 97-98 degrees. Patient states, "I can not get warm". She has tried heating blankets, small heaters, and adjusted the central heating in her home. Patient checked her blood sugar and it was 123 before breakfast. Patient also feels extremely weak and tired.     Allergies  Allergen Reactions  . Ace Inhibitors     cough   Previous Medications   AMLODIPINE (NORVASC) 10 MG TABLET    Take 1/2 tablet twice a day.    ASPIRIN 81 MG TABLET    Take 81 mg by mouth daily.     BUDESONIDE (ENTOCORT EC) 3 MG 24 HR CAPSULE       CETIRIZINE HCL 10 MG CAPS    Take by mouth.   CHOLESTYRAMINE (QUESTRAN) 4 G PACKET    Take by mouth.   ESCITALOPRAM (LEXAPRO) 20 MG TABLET    Take 1 tablet (20 mg total) by mouth daily.   FUROSEMIDE (LASIX) 20 MG TABLET    Take 1 tablet (20 mg total) by mouth daily as needed.   GLIPIZIDE (GLUCOTROL) 5 MG TABLET    Take by mouth.   GLYBURIDE (DIABETA) 2.5 MG TABLET    Take 2.5 mg by mouth daily with breakfast.     HYDROCHLOROTHIAZIDE (MICROZIDE) 12.5 MG CAPSULE    TAKE 1 CAPSULE BY MOUTH ONCE DAILY   IMIPRAMINE (TOFRANIL) 25 MG TABLET    Take 25 mg by mouth 2 (two) times daily.    LORAZEPAM (ATIVAN) 0.5 MG TABLET    TAKE 1 TABLET BY MOUTH TWICE A DAY   LOSARTAN (COZAAR) 100 MG TABLET    Take 1 tablet (100 mg total) by mouth daily.   MECLIZINE (ANTIVERT) 25 MG TABLET    Take 25 mg by mouth 3 (three) times daily as needed.     MELOXICAM (MOBIC) 7.5 MG TABLET    TAKE 1 TABLET BY MOUTH ONCE A DAY   MONTELUKAST (SINGULAIR) 10 MG TABLET    TAKE 1 TABLET BY MOUTH ONCE A DAY   SIMVASTATIN (ZOCOR) 20 MG TABLET       VITAMIN D, ERGOCALCIFEROL, (DRISDOL) 50000 UNITS CAPS    Take 50,000 Units by mouth every 30 (thirty) days.     Review of Systems  Constitutional: Positive for chills, activity change, appetite change and fatigue. Negative for fever and unexpected weight change.  HENT: Negative.  Negative for congestion, ear pain, hearing loss, rhinorrhea, sinus pressure, sneezing and tinnitus.   Eyes: Negative for pain and visual disturbance.  Respiratory: Negative.  Negative for cough, chest tightness, shortness of breath and wheezing.   Cardiovascular: Negative.  Negative for palpitations and leg swelling.  Gastrointestinal: Positive for diarrhea (chronic). Negative for nausea and abdominal pain.  Genitourinary: Positive for urgency and frequency. Negative for dysuria, hematuria, flank pain and pelvic pain.  Neurological: Negative for dizziness.  Hematological: Negative for adenopathy.  Psychiatric/Behavioral: Negative for confusion.    Social History  Substance Use Topics  . Smoking status: Former Smoker -- 10.00 packs/day for 1 years    Types: Cigarettes    Quit date: 12/27/1997  . Smokeless tobacco: Never Used  . Alcohol Use: No   Objective:   BP 106/62 mmHg  Pulse 92  Temp(Src) 98.7 F (37.1 C)  Resp 20  Wt   SpO2 92%  Physical Exam  General Appearance:    Alert, cooperative, no distress, obese  Eyes:    PERRL, conjunctiva/corneas clear, EOM's intact       Lungs:     Clear to auscultation bilaterally, respirations unlabored  Heart:    Regular rate and rhythm  Neurologic:   Awake, alert, oriented x 3. No apparent focal neurological           defect.   Abd:   Soft, non tender, no CVAT       Assessment & Plan:     1. Urinary tract infection with hematuria, site unspecified  - POCT urinalysis dipstick - ciprofloxacin (CIPRO) 500 MG tablet; Take 1 tablet (500 mg  total) by mouth 2 (two) times daily.  Dispense: 14 tablet; Refill: 0 - Urine culture        Lelon Huh, MD  [facility]

## 2015-01-02 LAB — URINE CULTURE

## 2015-01-02 LAB — SPECIMEN STATUS REPORT

## 2015-01-12 ENCOUNTER — Encounter: Payer: Self-pay | Admitting: Family Medicine

## 2015-01-12 ENCOUNTER — Ambulatory Visit (INDEPENDENT_AMBULATORY_CARE_PROVIDER_SITE_OTHER): Payer: Medicare Other | Admitting: Family Medicine

## 2015-01-12 VITALS — BP 130/66 | HR 76 | Temp 97.9°F | Resp 20 | Ht 69.0 in | Wt 326.0 lb

## 2015-01-12 DIAGNOSIS — F419 Anxiety disorder, unspecified: Secondary | ICD-10-CM | POA: Diagnosis not present

## 2015-01-12 DIAGNOSIS — F32A Depression, unspecified: Secondary | ICD-10-CM

## 2015-01-12 DIAGNOSIS — F329 Major depressive disorder, single episode, unspecified: Secondary | ICD-10-CM | POA: Diagnosis not present

## 2015-01-12 MED ORDER — BUPROPION HCL ER (SR) 150 MG PO TB12: 150.0000 mg | ORAL_TABLET | Freq: Two times a day (BID) | ORAL | Status: DC

## 2015-01-12 NOTE — Progress Notes (Signed)
Patient ID: BRAIDYN PEACE, female   DOB: 1930-09-06, 81 y.o.   MRN: [id number]        Patient: Kyara Boxer [facility] Female    DOB: 07-31-1930   81 y.o.   MRN: [id number] Visit Date: 01/12/2015  Today's Provider: Margarita Rana, MD   Chief Complaint  Patient presents with  . Fatigue   Subjective:    HPI Fatigue: Patient complains of fatigue. Symptoms began several weeks ago. Sentinal symptom the patient feels fatigue began with: hypersomnia, shortness of breath. Symptoms of her fatigue have been lack of interest in usual activities. Patient describes the following psychologic symptoms: depression.  Patient denies fever. Symptoms have stabilized. Severity has been severe. Previous visits for this problem: none.  Patient was seen on 12/31/14 by Dr. Caryn Section, pt was c/o chills and weakness. Patient was treated with Cipro 500 mg for 7 days. Urine culture was positive. Patient denies any UTI symptoms today. Did have to be brought in wheelchair that day.   Patient reports she uses her CPAP every night as indicated. Patient reports she is tired and sleepy all day long.  Still thinks she is depressed.   Did just have her labs checked and they were ok.      Allergies  Allergen Reactions  . Ace Inhibitors     cough   Previous Medications   AMLODIPINE (NORVASC) 10 MG TABLET    Take 10 mg by mouth daily.    ASPIRIN 81 MG TABLET    Take 81 mg by mouth daily.     CETIRIZINE HCL 10 MG CAPS    Take 1 capsule by mouth as needed.    CHOLESTYRAMINE (QUESTRAN) 4 G PACKET    Take by mouth.   ESCITALOPRAM (LEXAPRO) 20 MG TABLET    Take 1 tablet (20 mg total) by mouth daily.   GLIPIZIDE (GLUCOTROL) 5 MG TABLET    Take 5 mg by mouth daily before breakfast.    GLYBURIDE (DIABETA) 2.5 MG TABLET    Take 2.5 mg by mouth daily with breakfast.     HYDROCHLOROTHIAZIDE (MICROZIDE) 12.5 MG CAPSULE    TAKE 1 CAPSULE BY MOUTH ONCE DAILY   IMIPRAMINE (TOFRANIL) 25 MG TABLET    Take 25 mg by mouth 2 (two) times daily.    LORAZEPAM (ATIVAN) 0.5 MG TABLET    TAKE 1 TABLET BY MOUTH TWICE A DAY   LOSARTAN (COZAAR) 100 MG TABLET    Take 1 tablet (100 mg total) by mouth daily.   MECLIZINE (ANTIVERT) 25 MG TABLET    Take 25 mg by mouth 3 (three) times daily as needed.     MONTELUKAST (SINGULAIR) 10 MG TABLET    TAKE 1 TABLET BY MOUTH ONCE A DAY   SIMVASTATIN (ZOCOR) 20 MG TABLET    Take 20 mg by mouth daily at 6 PM.     Review of Systems  Constitutional: Positive for activity change and fatigue.  Respiratory: Positive for shortness of breath.     Social History  Substance Use Topics  . Smoking status: Former Smoker -- 10.00 packs/day for 1 years    Types: Cigarettes    Quit date: 12/27/1997  . Smokeless tobacco: Never Used  . Alcohol Use: No   Objective:   BP 130/66 mmHg  Pulse 76  Temp(Src) 97.9 F (36.6 C) (Oral)  Resp 20  Ht 5' 9"  (1.753 m)  Wt 326 lb (147.873 kg)  BMI 48.12 kg/m2  SpO2 97%  Physical Exam  Constitutional:  She is oriented to person, place, and time. She appears well-developed and well-nourished.  Cardiovascular: Normal rate and regular rhythm.   Pulmonary/Chest: Effort normal and breath sounds normal.  Neurological: She is alert and oriented to person, place, and time.  Psychiatric: She has a normal mood and affect. Her behavior is normal. Judgment and thought content normal.      Assessment & Plan:     1. Anxiety Worsening problem.  Will continue Lexapro and add Wellbutrin. Recheck in 4 to 6 weeks.  - buPROPion (WELLBUTRIN SR) 150 MG 12 hr tablet; Take 1 tablet (150 mg total) by mouth 2 (two) times daily.  Dispense: 60 tablet; Refill: 5  2. Depression As above.   - buPROPion (WELLBUTRIN SR) 150 MG 12 hr tablet; Take 1 tablet (150 mg total) by mouth 2 (two) times daily.  Dispense: 60 tablet; Refill: [place], MD  [facility]

## 2015-01-15 ENCOUNTER — Ambulatory Visit: Payer: Self-pay | Admitting: Family Medicine

## 2015-01-15 ENCOUNTER — Ambulatory Visit: Payer: Medicare Other | Admitting: Family Medicine

## 2015-01-17 ENCOUNTER — Other Ambulatory Visit: Payer: Self-pay | Admitting: Family Medicine

## 2015-01-17 DIAGNOSIS — I10 Essential (primary) hypertension: Secondary | ICD-10-CM

## 2015-01-26 ENCOUNTER — Ambulatory Visit (INDEPENDENT_AMBULATORY_CARE_PROVIDER_SITE_OTHER): Payer: Medicare Other | Admitting: Family Medicine

## 2015-01-26 ENCOUNTER — Encounter: Payer: Self-pay | Admitting: Family Medicine

## 2015-01-26 VITALS — BP 118/70 | HR 72 | Temp 98.0°F | Resp 16

## 2015-01-26 DIAGNOSIS — F329 Major depressive disorder, single episode, unspecified: Secondary | ICD-10-CM | POA: Diagnosis not present

## 2015-01-26 DIAGNOSIS — M7661 Achilles tendinitis, right leg: Secondary | ICD-10-CM | POA: Diagnosis not present

## 2015-01-26 DIAGNOSIS — F419 Anxiety disorder, unspecified: Secondary | ICD-10-CM

## 2015-01-26 DIAGNOSIS — F32A Depression, unspecified: Secondary | ICD-10-CM

## 2015-01-26 MED ORDER — MELOXICAM 7.5 MG PO TABS: 7.5000 mg | ORAL_TABLET | Freq: Two times a day (BID) | ORAL | Status: DC

## 2015-01-26 NOTE — Progress Notes (Signed)
Patient ID: Deborah [name], female   DOB: Jun 30, 1930, 81 y.o.   MRN: [id number]         Patient: Deborah [name] [facility] Female    DOB: October 06, 1930   81 y.o.   MRN: [id number] Visit Date: 01/26/2015  Today's Provider: Margarita Rana, MD   Chief Complaint  Patient presents with  . Depression  . Anxiety   Subjective:    Depression        This is a chronic problem.  The problem occurs constantly.  The problem has been gradually improving since onset.  Associated symptoms include no decreased concentration, no fatigue, no helplessness, no hopelessness, does not have insomnia, not irritable, no restlessness, no decreased interest, no appetite change, no myalgias, no headaches, no indigestion, not sad and no suicidal ideas.  Compliance with treatment is good.  Past medical history includes anxiety.   Anxiety Presents for follow-up visit. Patient reports no chest pain, confusion, decreased concentration, dizziness, excessive worry, insomnia, nervous/anxious behavior, palpitations, panic, restlessness or suicidal ideas. The quality of sleep is good.   The treatment provided moderate relief. Compliance with prior treatments has been good.  Ankle Pain  The incident occurred more than 1 week ago (About 10 days ago). There was no injury mechanism. The pain is present in the right ankle. The pain is moderate. The pain has been constant since onset. Associated symptoms include an inability to bear weight. Pertinent negatives include no loss of motion, loss of sensation, muscle weakness, numbness or tingling. She has tried ice, acetaminophen and heat for the symptoms. The treatment provided no relief.       Allergies  Allergen Reactions  . Ace Inhibitors     cough   Previous Medications   AMLODIPINE (NORVASC) 10 MG TABLET    Take 10 mg by mouth daily.    ASPIRIN 81 MG TABLET    Take 81 mg by mouth daily.     BUPROPION (WELLBUTRIN SR) 150 MG 12 HR TABLET    Take 1 tablet (150 mg total) by mouth 2 (two)  times daily.   CETIRIZINE HCL 10 MG CAPS    Take 1 capsule by mouth as needed.    CHOLESTYRAMINE (QUESTRAN) 4 G PACKET    Take by mouth.   ESCITALOPRAM (LEXAPRO) 20 MG TABLET    Take 1 tablet (20 mg total) by mouth daily.   GLIPIZIDE (GLUCOTROL) 5 MG TABLET    Take 5 mg by mouth daily before breakfast.    GLYBURIDE (DIABETA) 2.5 MG TABLET    Take 2.5 mg by mouth daily with breakfast.     HYDROCHLOROTHIAZIDE (MICROZIDE) 12.5 MG CAPSULE    TAKE 1 CAPSULE BY MOUTH ONCE DAILY   IMIPRAMINE (TOFRANIL) 25 MG TABLET    Take 25 mg by mouth 2 (two) times daily.    LORAZEPAM (ATIVAN) 0.5 MG TABLET    TAKE 1 TABLET BY MOUTH TWICE A DAY   LOSARTAN (COZAAR) 100 MG TABLET    Take 1 tablet (100 mg total) by mouth daily.   MECLIZINE (ANTIVERT) 25 MG TABLET    Take 25 mg by mouth 3 (three) times daily as needed.     MONTELUKAST (SINGULAIR) 10 MG TABLET    TAKE 1 TABLET BY MOUTH ONCE A DAY   SIMVASTATIN (ZOCOR) 20 MG TABLET    Take 20 mg by mouth daily at 6 PM.     Review of Systems  Constitutional: Negative for fever, chills, diaphoresis, activity change, appetite change, fatigue and  unexpected weight change.  Respiratory: Negative.   Cardiovascular: Positive for leg swelling (Right foot swollen). Negative for chest pain and palpitations.  Gastrointestinal: Negative.   Musculoskeletal: Positive for arthralgias (Right ankle pain). Negative for myalgias, back pain, joint swelling, gait problem, neck pain and neck stiffness.  Skin: Negative for wound.  Neurological: Negative for dizziness, tingling, weakness, numbness and headaches.  Psychiatric/Behavioral: Positive for depression. Negative for suicidal ideas, hallucinations, behavioral problems, confusion, sleep disturbance, self-injury, dysphoric mood, decreased concentration and agitation. The patient is not nervous/anxious, does not have insomnia and is not hyperactive.     Social History  Substance Use Topics  . Smoking status: Former Smoker -- 10.00  packs/day for 1 years    Types: Cigarettes    Quit date: 12/27/1997  . Smokeless tobacco: Never Used  . Alcohol Use: No   Objective:   BP 118/70 mmHg  Pulse 72  Temp(Src) 98 F (36.7 C) (Oral)  Resp 16  Physical Exam  Constitutional: She appears well-developed and well-nourished. She is not irritable.  Musculoskeletal:       Right ankle: Achilles tendon exhibits pain.  Psychiatric: She has a normal mood and affect. Her behavior is normal. Judgment and thought content normal.        Assessment & Plan:     1. Anxiety Stable. Continue medication.    2. Depression Stable. Continue medication, improved with medication changes.    3. Achilles tendinitis of right lower extremity New problem. Of unclear etiology. Will refer to orthopedics to evaluate and treat.    - Ambulatory referral to Orthopedic Surgery - meloxicam (MOBIC) 7.5 MG tablet; Take 1 tablet (7.5 mg total) by mouth 2 (two) times daily.  Dispense: 60 tablet; Refill: 0       Patient was seen and examined by Jerrell Belfast, MD, and note scribed by Ashley Royalty, CMA.  I have reviewed the document for accuracy and completeness and I agree with above. - Jerrell Belfast, MD  Margarita Rana, MD  [facility]

## 2015-01-31 ENCOUNTER — Other Ambulatory Visit: Payer: Self-pay | Admitting: Family Medicine

## 2015-01-31 DIAGNOSIS — I10 Essential (primary) hypertension: Secondary | ICD-10-CM

## 2015-01-31 DIAGNOSIS — F419 Anxiety disorder, unspecified: Secondary | ICD-10-CM

## 2015-01-31 NOTE — Telephone Encounter (Signed)
Printed, please fax or call in to pharmacy. Thank you.

## 2015-02-21 ENCOUNTER — Other Ambulatory Visit: Payer: Self-pay | Admitting: Family Medicine

## 2015-02-25 ENCOUNTER — Encounter: Payer: Medicare Other | Admitting: Family Medicine

## 2015-03-10 ENCOUNTER — Emergency Department: Payer: Medicare Other

## 2015-03-10 ENCOUNTER — Encounter: Payer: Self-pay | Admitting: Radiology

## 2015-03-10 ENCOUNTER — Emergency Department
Admission: EM | Admit: 2015-03-10 | Discharge: 2015-03-10 | Disposition: A | Discharge status: Home / Self Care | Payer: Medicare Other | Attending: Emergency Medicine | Admitting: Emergency Medicine

## 2015-03-10 DIAGNOSIS — S61411A Laceration without foreign body of right hand, initial encounter: Secondary | ICD-10-CM | POA: Insufficient documentation

## 2015-03-10 DIAGNOSIS — N183 Chronic kidney disease, stage 3 (moderate): Secondary | ICD-10-CM | POA: Diagnosis not present

## 2015-03-10 DIAGNOSIS — W1839XA Other fall on same level, initial encounter: Secondary | ICD-10-CM | POA: Diagnosis not present

## 2015-03-10 DIAGNOSIS — S61419A Laceration without foreign body of unspecified hand, initial encounter: Secondary | ICD-10-CM

## 2015-03-10 DIAGNOSIS — Y9389 Activity, other specified: Secondary | ICD-10-CM | POA: Insufficient documentation

## 2015-03-10 DIAGNOSIS — W19XXXA Unspecified fall, initial encounter: Secondary | ICD-10-CM

## 2015-03-10 DIAGNOSIS — Z7982 Long term (current) use of aspirin: Secondary | ICD-10-CM | POA: Diagnosis not present

## 2015-03-10 DIAGNOSIS — Z23 Encounter for immunization: Secondary | ICD-10-CM | POA: Insufficient documentation

## 2015-03-10 DIAGNOSIS — I129 Hypertensive chronic kidney disease with stage 1 through stage 4 chronic kidney disease, or unspecified chronic kidney disease: Secondary | ICD-10-CM | POA: Diagnosis not present

## 2015-03-10 DIAGNOSIS — S61219A Laceration without foreign body of unspecified finger without damage to nail, initial encounter: Secondary | ICD-10-CM

## 2015-03-10 DIAGNOSIS — Z87891 Personal history of nicotine dependence: Secondary | ICD-10-CM | POA: Insufficient documentation

## 2015-03-10 DIAGNOSIS — Y92002 Bathroom of unspecified non-institutional (private) residence single-family (private) house as the place of occurrence of the external cause: Secondary | ICD-10-CM | POA: Insufficient documentation

## 2015-03-10 DIAGNOSIS — S61211A Laceration without foreign body of left index finger without damage to nail, initial encounter: Secondary | ICD-10-CM | POA: Diagnosis not present

## 2015-03-10 DIAGNOSIS — E119 Type 2 diabetes mellitus without complications: Secondary | ICD-10-CM | POA: Diagnosis not present

## 2015-03-10 DIAGNOSIS — E669 Obesity, unspecified: Secondary | ICD-10-CM | POA: Diagnosis not present

## 2015-03-10 DIAGNOSIS — Z79899 Other long term (current) drug therapy: Secondary | ICD-10-CM | POA: Diagnosis not present

## 2015-03-10 DIAGNOSIS — Y998 Other external cause status: Secondary | ICD-10-CM | POA: Insufficient documentation

## 2015-03-10 DIAGNOSIS — S0081XA Abrasion of other part of head, initial encounter: Secondary | ICD-10-CM | POA: Diagnosis not present

## 2015-03-10 DIAGNOSIS — S0990XA Unspecified injury of head, initial encounter: Secondary | ICD-10-CM | POA: Diagnosis not present

## 2015-03-10 DIAGNOSIS — Z791 Long term (current) use of non-steroidal anti-inflammatories (NSAID): Secondary | ICD-10-CM | POA: Insufficient documentation

## 2015-03-10 MED ORDER — LIDOCAINE-EPINEPHRINE (PF) 2 %-1:200000 IJ SOLN: 2.0000 mL | Freq: Once | INTRAMUSCULAR | Status: DC

## 2015-03-10 MED ORDER — TETANUS-DIPHTH-ACELL PERTUSSIS 5-2.5-18.5 LF-MCG/0.5 IM SUSP
0.5000 mL | Freq: Once | INTRAMUSCULAR | Status: AC
Start: 2015-03-10 — End: 2015-03-10
  Administered 2015-03-10: 0.5 mL via INTRAMUSCULAR
  Filled 2015-03-10: qty 0.5

## 2015-03-10 MED ORDER — LIDOCAINE HCL (PF) 1 % IJ SOLN: 2.0000 mL | Freq: Once | INTRAMUSCULAR | Status: DC

## 2015-03-10 MED ORDER — LIDOCAINE-EPINEPHRINE 2 %-1:100000 IJ SOLN: 1.7000 mL | Freq: Once | INTRAMUSCULAR | Status: DC

## 2015-03-10 MED ORDER — LIDOCAINE-EPINEPHRINE-TETRACAINE (LET) SOLUTION
3.0000 mL | Freq: Once | NASAL | Status: DC
  Filled 2015-03-10: qty 3

## 2015-03-10 MED ORDER — BACITRACIN-NEOMYCIN-POLYMYXIN OINTMENT TUBE
TOPICAL_OINTMENT | CUTANEOUS | Status: DC | PRN
  Filled 2015-03-10: qty 15

## 2015-03-10 MED ORDER — BACITRACIN ZINC 500 UNIT/GM EX OINT
TOPICAL_OINTMENT | CUTANEOUS | Status: DC | PRN
  Administered 2015-03-10: 1 via TOPICAL
  Filled 2015-03-10: qty 0.9

## 2015-03-10 MED ORDER — LIDOCAINE-EPINEPHRINE (PF) 1 %-1:200000 IJ SOLN
INTRAMUSCULAR | Status: AC
  Administered 2015-03-10: 20:00:00
  Filled 2015-03-10: qty 30

## 2015-03-10 NOTE — ED Provider Notes (Signed)
[facility] Emergency Department Provider Note  ____________________________________________  Time seen: Approximately 6:06 PM  I have reviewed the triage vital signs and the nursing notes.   HISTORY  Chief Complaint Fall    HPI Deborah [name] is a 82 y.o. female with a history of recurrent falls (5 this month), recent Achilles tear wearing a boot on the right leg, morbid obesity, HTN, HL, diabetes presenting with fall. Patient reports that she was ambulating towards the bathroom and lost her balance and fell down." It happened so fast." She denies any associated chest pain, shortness of breath, palpitations, loss of consciousness or syncope. She has multiple skin tears and lacerations on her hands, and erythema on her forehead. She denies any headache, nausea or vomiting, numbness tickling or weakness.   Past Medical History  Diagnosis Date  . Hyperlipidemia   . Diabetes mellitus   . Palpitations   . Hypertension   . Osteoarthritis   . Contracture of palmar fascia   . Cough   . Anxiety   . Otitis externa   . Unspecified adjustment reaction   . Unspecified urinary incontinence   . Secondary hyperparathyroidism (of renal origin)   . Chronic kidney disease, stage III (moderate)   . Pure hypercholesterolemia   . Other specified acquired hypothyroidism   . Unspecified hereditary and idiopathic peripheral neuropathy   . Peripheral neuropathy ([place])   . Obesity   . Sleep apnea     Patient Active Problem List   Diagnosis Date Noted  . Depression 01/12/2015  . Shoulder pain 12/17/2014  . Pain in shoulder 08/05/2014  . Gonalgia 07/21/2014  . History of knee surgery 07/21/2014  . Allergic rhinitis 07/16/2014  . Anxiety 07/16/2014  . Benign hypertension 07/16/2014  . Alteration in bowel elimination: incontinence 07/16/2014  . Chronic kidney disease (CKD), stage III (moderate) 07/16/2014  . Colitis 07/16/2014  . CN (constipation) 07/16/2014  . D  (diarrhea) 07/16/2014  . Contracture of palmar fascia ([place]uytren's) 07/16/2014  . Hypercholesteremia 07/16/2014  . Hypoxia 07/16/2014  . Absence of bladder continence 07/16/2014  . Focal lymphocytic colitis 07/16/2014  . Extreme obesity ([place]) 07/16/2014  . Arthritis, degenerative 07/16/2014  . Disorder of peripheral nervous system ([place]) 07/16/2014  . Neuralgia neuritis, sciatic nerve 07/16/2014  . Hyperparathyroidism, secondary ([place]) 07/16/2014  . Apnea, sleep 07/16/2014  . Subclinical hypothyroidism 07/16/2014  . Diabetes mellitus, type 2 ([place]) 07/16/2014  . 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 Urinary incontinence without sensory awareness 09/03/2012  . Palpitations 12/28/2010  . SOB (shortness of breath) 12/28/2010  . Overweight 08/02/2009    Past Surgical History  Procedure Laterality Date  . Replacement total knee Bilateral   . Hand surgery Right   . Back surgery  1990    Ruptured Disc.  Marland Kitchen Shoulder surgery Right 06/2007  . Colonoscopy    . Cardiac catheterization  03/2008    Current Outpatient Rx  Name  Route  Sig  Dispense  Refill  . acetaminophen (TYLENOL) 500 MG tablet   Oral   Take 500 mg by mouth every 6 (six) hours as needed.         Marland Kitchen amLODipine (NORVASC) 5 MG tablet      TAKE 2 TABLETS BY MOUTH ONCE A DAY   90 tablet   1   . aspirin 81 MG tablet   Oral   Take 81 mg by mouth daily.           . budesonide (ENTOCORT EC) 3 MG  24 hr capsule      TAKE 2 CAPSULES BY MOUTH EVERY 24 HOURS   60 capsule   5   . buPROPion (WELLBUTRIN SR) 150 MG 12 hr tablet   Oral   Take 1 tablet (150 mg total) by mouth 2 (two) times daily.   60 tablet   5   . Cetirizine HCl 10 MG CAPS   Oral   Take 1 capsule by mouth as needed.          Marland Kitchen escitalopram (LEXAPRO) 20 MG tablet   Oral   Take 1 tablet (20 mg total) by mouth daily.   30 tablet   5   . glipiZIDE (GLUCOTROL) 5 MG tablet   Oral   Take 5 mg by mouth daily before breakfast.          . glyBURIDE  (DIABETA) 2.5 MG tablet   Oral   Take 2.5 mg by mouth daily with breakfast.           . hydrochlorothiazide (MICROZIDE) 12.5 MG capsule      TAKE 1 CAPSULE BY MOUTH ONCE DAILY   90 capsule   1   . imipramine (TOFRANIL) 25 MG tablet   Oral   Take 25 mg by mouth 2 (two) times daily.          Marland Kitchen LORazepam (ATIVAN) 0.5 MG tablet      TAKE 1 TABLET BY MOUTH TWICE A DAY   180 tablet   1   . meclizine (ANTIVERT) 25 MG tablet   Oral   Take 25 mg by mouth 3 (three) times daily as needed.           . meloxicam (MOBIC) 7.5 MG tablet   Oral   Take 1 tablet (7.5 mg total) by mouth 2 (two) times daily.   60 tablet   0   . montelukast (SINGULAIR) 10 MG tablet      TAKE 1 TABLET BY MOUTH ONCE A DAY   90 tablet   3   . simvastatin (ZOCOR) 20 MG tablet   Oral   Take 20 mg by mouth daily at 6 PM.       0   . EXPIRED: losartan (COZAAR) 100 MG tablet   Oral   Take 1 tablet (100 mg total) by mouth daily.   30 tablet   11     Allergies Ace inhibitors  Family History  Problem Relation Age of Onset  . Heart attack Father   . Hypertension Sister   . Hypertension Sister   . Alzheimer's disease Mother     Social History Social History  Substance Use Topics  . Smoking status: Former Smoker -- 10.00 packs/day for 1 years    Types: Cigarettes    Quit date: 12/27/1997  . Smokeless tobacco: Never Used  . Alcohol Use: No    Review of Systems Constitutional: No fever/chills. No lightheadedness or syncope. Eyes: No visual changes. No blurred or double vision. ENT: No sore throat. HEAD: Positive head trauma. Cardiovascular: Denies chest pain, palpitations. Respiratory: Denies shortness of breath.  No cough. Gastrointestinal: No abdominal pain.  No nausea, no vomiting.  No diarrhea.  No constipation. Genitourinary: Negative for dysuria. Musculoskeletal: Negative for back pain. Skin: Negative for rash. Multiple lacerations over the hands. Neurological: Negative for  headaches, focal weakness or numbness.  10-point ROS otherwise negative.  ____________________________________________   PHYSICAL EXAM:  VITAL SIGNS: ED Triage Vitals  Enc Vitals Group  BP 03/10/15 1758 129/99 mmHg     Pulse Rate 03/10/15 1758 74     Resp 03/10/15 1758 20     Temp 03/10/15 1758 97.8 F (36.6 C)     Temp Source 03/10/15 1758 Oral     SpO2 03/10/15 1758 97 %     Weight 03/10/15 1758 353 lb (160.12 kg)     Height 03/10/15 1758 5' 9"  (1.753 m)     Head Cir --      Peak Flow --      Pain Score --      Pain Loc --      Pain Edu? --      Excl. in [place]? --     Constitutional: Patient is alert and oriented and answering questions properly. GCS is 15. Patient is chronically ill appearing but in no acute distress at this time.  Eyes: Conjunctivae are normal.  EOMI. no raccoon eyes. PERRLA. Head: Erythema and superficial skin abrasion over the right forehead. Negative Battle sign. Midface instability. Nose: No congestion/rhinnorhea. No septal hematoma. Mouth/Throat: Mucous membranes are moist. No dental injury or malocclusion. Neck: No stridor.  No midline C-spine tenderness to palpation, step-offs or deformities. Full range of motion without pain. Cardiovascular: Normal rate, regular rhythm. No murmurs, rubs or gallops.  Respiratory: Normal respiratory effort.  No retractions. Lungs CTAB.  No wheezes, rales or ronchi. Gastrointestinal: Obese. Soft and nontender. No distention. No peritoneal signs. Musculoskeletal: Range of motion without pain of the bilateral hips, bilateral knees, and left ankle. Right ankle is in a boot. Full range of motion of the bilateral wrists, elbow close, and shoulders. Neurologic:  Normal speech and language. No gross focal neurologic deficits are appreciated.  Skin:  Skin is warm, dry and intact. No rash noted. Patient has multiple bruises throughout her body that are of different ages. She does have bluish discoloration that is 5 x 5" on the  right posterior back/shoulder.  On the left hand, the patient has a C-shaped superficial skin tear over the third knuckle. She also has a linear laceration with some skin loss just proximal to the IP joint with normal flexion and extension and no evidence of tendon involvement. Right hand has a C-shaped skin tear over the dorsal aspect of the hand. Psychiatric: Mood and affect are normal. Speech and behavior are normal.  Normal judgement.  ____________________________________________   LABS (all labs ordered are listed, but only abnormal results are displayed)  Labs Reviewed - No data to display ____________________________________________  EKG  ED ECG REPORT I, Eula Listen, the attending physician, personally viewed and interpreted this ECG.   Date: 03/10/2015  EKG Time: 1814  Rate: 77  Rhythm: normal sinus rhythm  Axis: Normal  Intervals:Possible shortened PR interval but baseline is unclear  ST&T Change: No ST elevation; no skin changes.  ____________________________________________  RADIOLOGY  Ct Head Wo Contrast  03/10/2015  CLINICAL DATA:  Status post fall, possible head injury. EXAM: CT HEAD WITHOUT CONTRAST TECHNIQUE: Contiguous axial images were obtained from the base of the skull through the vertex without intravenous contrast. COMPARISON:  None. FINDINGS: There is mild generalized brain atrophy with commensurate dilatation of the ventricles and sulci. Minimal chronic small vessel ischemic changes noted within the deep periventricular white matter and right basal ganglia. There is no mass, hemorrhage, edema or other evidence of acute parenchymal abnormality. No osseous fracture or dislocation seen. Paranasal sinuses are clear. Mastoid air cells are clear. Superficial soft tissues are unremarkable. No superficial  soft tissue hematoma. IMPRESSION: No acute findings. No intracranial hemorrhage or edema. No skull fracture. Electronically Signed   By: Franki Cabot M.D.    On: 03/10/2015 18:27   Dg Finger Index Left  03/10/2015  CLINICAL DATA:  Fall trying to use the bedside commode EXAM: LEFT INDEX FINGER 2+V COMPARISON:  None. FINDINGS: Three views of the left second finger submitted. No acute fracture or subluxation. No radiopaque foreign body. Mild degenerative changes distal interphalangeal joint. IMPRESSION: No acute fracture or subluxation. Mild degenerative changes distal interphalangeal joint. Electronically Signed   By: Lahoma Crocker M.D.   On: 03/10/2015 18:37    ____________________________________________   PROCEDURES  Procedure(s) performed: None  Critical Care performed: No ____________________________________________   INITIAL IMPRESSION / ASSESSMENT AND PLAN / ED COURSE  Pertinent labs & imaging results that were available during my care of the patient were reviewed by me and considered in my medical decision making (see chart for details).  82 y.o. female with a history of recurrent falls, currently in a boot for an Achilles tendon rupture, with a mechanical fall. There is no evidence that the patient had any syncopal episode. I will check her for any injury from her fall including intracranial injury, or left finger fracture. She will require laceration repair over the left finger but the rest of her skin tears will be treated with Neosporin and conservative management.  ----------------------------------------- 7:16 PM on 03/10/2015 -----------------------------------------  Patient continues to be hemodynamically stable and is not having any pain at this time. Her CT scan does not show acute injury, and her finger x-ray does not show fracture. I will plan to repair her laceration, and discharge home.  LACERATION REPAIR Performed by: Eula Listen Authorized by: Eula Listen Consent: Verbal consent obtained. Risks and benefits: risks, benefits and alternatives were discussed Consent given by: patient Patient  identity confirmed: provided demographic data Prepped and Draped in normal sterile fashion Wound explored  Laceration Location: left index finger   Laceration Length: 2cm  No Foreign Bodies seen or palpated  Anesthesia: local infiltration  Local anesthetic: lidocaine 1% with epinephrine  Anesthetic total: 2 ml  Irrigation method: syringe Amount of cleaning: standard  Skin closure: 4-0 Prolene  Number of sutures: 7  Technique: simple interrupted.  Patient tolerance: Patient tolerated the procedure well with no immediate complications.   ____________________________________________  FINAL CLINICAL IMPRESSION(S) / ED DIAGNOSES  Final diagnoses:  Skin tear of hand without complication, unspecified laterality, initial encounter  Fall, initial encounter  Abrasion of forehead, initial encounter  Laceration of finger, left, initial encounter      NEW MEDICATIONS STARTED DURING THIS VISIT:  New Prescriptions   No medications on file     Eula Listen, MD 03/10/15 1958

## 2015-03-10 NOTE — ED Notes (Signed)
Pharmacy called and notified about need of neosporin. States they will send it.

## 2015-03-10 NOTE — ED Notes (Signed)
Per EMS, patient comes from home after falling while trying to use the bedside commode. Patient states she is getting her bathroom redone and that is why she has the bedside commode. Patient denies attempting to have a BM, patient states, "I didn't get that far". Patients husband told EMS that she hit her head on the dresser during the fall. Bruise noted to left forehead. Patient denies pain at this time. Skin tears noted to bilateral hands. Denies LOC. Denies SOB, CP. Patient states this is the 5th time she has fallen in the past month. Patient A&O x4.

## 2015-03-10 NOTE — Discharge Instructions (Signed)
You may wash your hands, even with the stitches in them, but do not soak them for prolonged periods of time until the sutures have been removed. Please apply Neosporin or any triple antibiotic cream and a thick coat 3 times daily to all of your lacerations and skin tears.  Please return to the emergency department if you develop significant pain, pus drainage, swelling or redness around your skin tears, headache or vomiting, additional falls, or any other symptoms concerning to you.

## 2015-03-11 ENCOUNTER — Telehealth: Payer: Self-pay | Admitting: Family Medicine

## 2015-03-11 NOTE — Telephone Encounter (Signed)
Pt was discharged from [facility] ER on 03/10/2015 from a fall at home.  I have scheduled a hospital follow up/MW

## 2015-03-14 NOTE — Telephone Encounter (Signed)
LMTCB for TIC call. Renaldo Fiddler, CMA

## 2015-03-17 ENCOUNTER — Ambulatory Visit (INDEPENDENT_AMBULATORY_CARE_PROVIDER_SITE_OTHER): Payer: Medicare Other | Admitting: Family Medicine

## 2015-03-17 ENCOUNTER — Encounter: Payer: Self-pay | Admitting: Family Medicine

## 2015-03-17 VITALS — BP 140/72 | HR 76 | Temp 98.0°F | Resp 20

## 2015-03-17 DIAGNOSIS — S61219D Laceration without foreign body of unspecified finger without damage to nail, subsequent encounter: Secondary | ICD-10-CM | POA: Diagnosis not present

## 2015-03-17 NOTE — Progress Notes (Signed)
Patient ID: Deborah [name], [name]   DOB: 07-Dec-1930, 82 y.o.   MRN: [id number]       Patient: Deborah [name]    DOB: October 13, 1930   82 y.o.   MRN: [id number] Visit Date: 03/17/2015  Today's Provider: Margarita Rana, MD   Chief Complaint  Patient presents with  . Follow-up   Subjective:    HPI  Follow up ER visit  Patient was seen in ER for fall and injury on 03/11/15. She was treated for a laceration to index finger on left hand. Treatment for this included 7 stiches. She reports excellent compliance with treatment. She reports this condition is Improved.  ------------------------------------------------------------------------------------        Allergies  Allergen Reactions  . Ace Inhibitors     cough   Previous Medications   ACETAMINOPHEN (TYLENOL) 500 MG TABLET    Take 500 mg by mouth every 6 (six) hours as needed.   AMLODIPINE (NORVASC) 5 MG TABLET    TAKE 2 TABLETS BY MOUTH ONCE A DAY   ASPIRIN 81 MG TABLET    Take 81 mg by mouth daily.     BUDESONIDE (ENTOCORT EC) 3 MG 24 HR CAPSULE    TAKE 2 CAPSULES BY MOUTH EVERY 24 HOURS   BUPROPION (WELLBUTRIN SR) 150 MG 12 HR TABLET    Take 1 tablet (150 mg total) by mouth 2 (two) times daily.   CETIRIZINE HCL 10 MG CAPS    Take 1 capsule by mouth as needed.    ESCITALOPRAM (LEXAPRO) 20 MG TABLET    Take 1 tablet (20 mg total) by mouth daily.   GLIPIZIDE (GLUCOTROL) 5 MG TABLET    Take 5 mg by mouth daily before breakfast.    GLYBURIDE (DIABETA) 2.5 MG TABLET    Take 2.5 mg by mouth daily with breakfast.     HYDROCHLOROTHIAZIDE (MICROZIDE) 12.5 MG CAPSULE    TAKE 1 CAPSULE BY MOUTH ONCE DAILY   IMIPRAMINE (TOFRANIL) 25 MG TABLET    Take 25 mg by mouth 2 (two) times daily.    LORAZEPAM (ATIVAN) 0.5 MG TABLET    TAKE 1 TABLET BY MOUTH TWICE A DAY   LOSARTAN (COZAAR) 100 MG TABLET    Take 1 tablet (100 mg total) by mouth daily.   MECLIZINE (ANTIVERT) 25 MG TABLET    Take 25 mg by mouth 3 (three) times daily as needed.       MELOXICAM (MOBIC) 7.5 MG TABLET    Take 1 tablet (7.5 mg total) by mouth 2 (two) times daily.   MONTELUKAST (SINGULAIR) 10 MG TABLET    TAKE 1 TABLET BY MOUTH ONCE A DAY   SIMVASTATIN (ZOCOR) 20 MG TABLET    Take 20 mg by mouth daily at 6 PM.     Review of Systems  Constitutional: Negative.     Social History  Substance Use Topics  . Smoking status: Former Smoker -- 10.00 packs/day for 1 years    Types: Cigarettes    Quit date: 12/27/1997  . Smokeless tobacco: Never Used  . Alcohol Use: No   Objective:   BP 140/72 mmHg  Pulse 76  Temp(Src) 98 F (36.7 C) (Oral)  Resp 20  Wt   Physical Exam  Constitutional: She appears well-developed and well-nourished.  Skin: Skin is warm and dry. No erythema.  Well healing laceration on left index finger. Some ability to bend finger, but limited.  7 sutures removed without any difficulty.  Assessment & Plan:     1. Laceration of finger of left hand, subsequent encounter No infection noted. Tolerated suture removal.  Monitor for infection. Continue PT to prevent further falls.         Patient was seen and examined by Jerrell Belfast, MD, and note scribed by Lynford Humphrey, [place].   I have reviewed the document for accuracy and completeness and I agree with above. - Jerrell Belfast, MD  Margarita Rana, MD  [facility]

## 2015-03-28 ENCOUNTER — Other Ambulatory Visit: Payer: Self-pay | Admitting: Family Medicine

## 2015-03-28 DIAGNOSIS — M1990 Unspecified osteoarthritis, unspecified site: Secondary | ICD-10-CM

## 2015-03-28 DIAGNOSIS — E7800 Pure hypercholesterolemia, unspecified: Secondary | ICD-10-CM

## 2015-04-18 ENCOUNTER — Other Ambulatory Visit: Payer: Self-pay | Admitting: Family Medicine

## 2015-04-18 DIAGNOSIS — E119 Type 2 diabetes mellitus without complications: Secondary | ICD-10-CM

## 2015-04-18 NOTE — Telephone Encounter (Signed)
Last A1C 12/20/2014 and was 6.1%. Renaldo Fiddler, CMA

## 2015-05-06 ENCOUNTER — Encounter: Payer: Self-pay | Admitting: Family Medicine

## 2015-05-06 ENCOUNTER — Ambulatory Visit (INDEPENDENT_AMBULATORY_CARE_PROVIDER_SITE_OTHER): Payer: Medicare Other | Admitting: Family Medicine

## 2015-05-06 VITALS — BP 126/62 | HR 68 | Temp 97.4°F | Resp 16 | Ht 68.0 in

## 2015-05-06 DIAGNOSIS — F32A Depression, unspecified: Secondary | ICD-10-CM

## 2015-05-06 DIAGNOSIS — H8110 Benign paroxysmal vertigo, unspecified ear: Secondary | ICD-10-CM | POA: Diagnosis not present

## 2015-05-06 DIAGNOSIS — Z0000 Encounter for general adult medical examination without abnormal findings: Secondary | ICD-10-CM

## 2015-05-06 DIAGNOSIS — F329 Major depressive disorder, single episode, unspecified: Secondary | ICD-10-CM | POA: Diagnosis not present

## 2015-05-06 DIAGNOSIS — R269 Unspecified abnormalities of gait and mobility: Secondary | ICD-10-CM | POA: Diagnosis not present

## 2015-05-06 DIAGNOSIS — M7661 Achilles tendinitis, right leg: Secondary | ICD-10-CM | POA: Diagnosis not present

## 2015-05-06 NOTE — Progress Notes (Signed)
Patient ID: Deborah [name], female   DOB: 1930-05-26, 82 y.o.   MRN: [id number]       Patient: Deborah [name], Female    DOB: 08-17-1930, 82 y.o.   MRN: [id number] Visit Date: 05/06/2015  Today's Provider: Margarita Rana, MD   Chief Complaint  Patient presents with  . Medicare Wellness   Subjective:    Annual wellness visit Deborah [name] is a 82 y.o. female. She feels well. She reports exercising PT 2 times a week. She reports she is sleeping well.  12/07/13 CPE 09/23/13 Mammogram-BI-RADS 1 10/29/13 colonoscopy-normal, recheck in 5 yrs  -----------------------------------------------------------   Follow up for achilles tendon tear  The patient was last seen for this 1 months ago by Dr. Noemi Chapel at [facility] . Changes made at last visit include PT twice a week.  She reports excellent compliance with treatment. She feels that condition is Unchanged. Patient reports that at her last appointment she was told by a nurse that she was never going to walk without a walker or support.   ------------------------------------------------------------------------------------   Review of Systems  Constitutional: Negative.   HENT: Positive for congestion.   Eyes: Positive for itching.  Respiratory: Positive for apnea.   Cardiovascular: Negative.   Gastrointestinal: Positive for diarrhea.  Endocrine: Positive for polyuria.  Genitourinary: Positive for enuresis.  Musculoskeletal: Positive for myalgias, back pain, arthralgias and gait problem.  Skin: Negative.   Allergic/Immunologic: Positive for environmental allergies.  Neurological: Negative.   Hematological: Bruises/bleeds easily.  Psychiatric/Behavioral: The patient is nervous/anxious.     Social History   Social History  . Marital Status: Married    Spouse Name: N/A  . Number of Children: 2  . Years of Education: N/A   Occupational History  . Retired [employer]    Worked in Proofreader.     Social History Main Topics  . Smoking status: Former Smoker -- 10.00 packs/day for 1 years    Types: Cigarettes    Quit date: 12/27/1997  . Smokeless tobacco: Never Used  . Alcohol Use: No  . Drug Use: No  . Sexual Activity: No   Other Topics Concern  . Not on file   Social History Narrative    Past Medical History  Diagnosis Date  . Hyperlipidemia   . Diabetes mellitus   . Palpitations   . Hypertension   . Osteoarthritis   . Contracture of palmar fascia   . Cough   . Anxiety   . Otitis externa   . Unspecified adjustment reaction   . Unspecified urinary incontinence   . Secondary hyperparathyroidism (of renal origin)   . Chronic kidney disease, stage III (moderate)   . Pure hypercholesterolemia   . Other specified acquired hypothyroidism   . Unspecified hereditary and idiopathic peripheral neuropathy   . Peripheral neuropathy ([place])   . Obesity   . Sleep apnea      Patient Active Problem List   Diagnosis Date Noted  . Depression 01/12/2015  . Shoulder pain 12/17/2014  . Pain in shoulder 08/05/2014  . Gonalgia 07/21/2014  . History of knee surgery 07/21/2014  . Allergic rhinitis 07/16/2014  . Anxiety 07/16/2014  . Benign hypertension 07/16/2014  . Alteration in bowel elimination: incontinence 07/16/2014  . Chronic kidney disease (CKD), stage III (moderate) 07/16/2014  . Colitis 07/16/2014  . CN (constipation) 07/16/2014  . D (diarrhea) 07/16/2014  . Contracture of palmar fascia (Dupuytren's) 07/16/2014  . Hypercholesteremia 07/16/2014  . Hypoxia 07/16/2014  . Absence of  bladder continence 07/16/2014  . Focal lymphocytic colitis 07/16/2014  . Extreme obesity ([place]) 07/16/2014  . Arthritis, degenerative 07/16/2014  . Disorder of peripheral nervous system ([place]) 07/16/2014  . Neuralgia neuritis, sciatic nerve 07/16/2014  . Hyperparathyroidism, secondary ([place]) 07/16/2014  . Apnea, sleep 07/16/2014  . Subclinical hypothyroidism 07/16/2014  . Diabetes  mellitus, type 2 ([place]) 07/16/2014  . 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 Urinary incontinence without sensory awareness 09/03/2012  . Palpitations 12/28/2010  . SOB (shortness of breath) 12/28/2010  . Overweight 08/02/2009    Past Surgical History  Procedure Laterality Date  . Replacement total knee Bilateral   . Hand surgery Right   . Back surgery  1990    Ruptured Disc.  Marland Kitchen Shoulder surgery Right 06/2007  . Colonoscopy    . Cardiac catheterization  03/2008    Her family history includes Alzheimer's disease in her mother; Heart attack in her father; Hypertension in her sister and sister.    Previous Medications   ACETAMINOPHEN (TYLENOL) 500 MG TABLET    Take 500 mg by mouth every 6 (six) hours as needed.   AMLODIPINE (NORVASC) 5 MG TABLET    TAKE 2 TABLETS BY MOUTH ONCE A DAY   ASPIRIN 81 MG TABLET    Take 81 mg by mouth daily.     BUPROPION (WELLBUTRIN SR) 150 MG 12 HR TABLET    Take 1 tablet (150 mg total) by mouth 2 (two) times daily.   CETIRIZINE HCL 10 MG CAPS    Take 1 capsule by mouth as needed.    ESCITALOPRAM (LEXAPRO) 20 MG TABLET    Take 1 tablet (20 mg total) by mouth daily.   GLIPIZIDE (GLUCOTROL) 5 MG TABLET    TAKE 1 TABLET BY MOUTH ONCE A DAY   GLYBURIDE (DIABETA) 2.5 MG TABLET    Take 2.5 mg by mouth daily with breakfast.     HYDROCHLOROTHIAZIDE (MICROZIDE) 12.5 MG CAPSULE    TAKE 1 CAPSULE BY MOUTH ONCE DAILY   IMIPRAMINE (TOFRANIL) 25 MG TABLET    Take 25 mg by mouth 2 (two) times daily.    LORAZEPAM (ATIVAN) 0.5 MG TABLET    TAKE 1 TABLET BY MOUTH TWICE A DAY   LOSARTAN (COZAAR) 100 MG TABLET    Take 1 tablet (100 mg total) by mouth daily.   MELOXICAM (MOBIC) 7.5 MG TABLET    TAKE 1 TABLET BY MOUTH TWICE A DAY   SIMVASTATIN (ZOCOR) 20 MG TABLET    TAKE 1 TABLET BY MOUTH EVERY NIGHT AT BEDTIME    Patient Care Team: Margarita Rana, MD as PCP - General (Family Medicine)     Objective:   Vitals: BP 126/62 mmHg  Pulse 68  Temp(Src) 97.4 F (36.3 C)  Resp 16   Ht 5' 8"  (1.727 m)  Wt   Physical Exam  Constitutional: She is oriented to person, place, and time. She appears well-developed and well-nourished.  HENT:  Head: Normocephalic and atraumatic.  Right Ear: Tympanic membrane, external ear and ear canal normal.  Left Ear: Tympanic membrane, external ear and ear canal normal.  Nose: Nose normal.  Mouth/Throat: Uvula is midline, oropharynx is clear and moist and mucous membranes are normal.  Eyes: Conjunctivae, EOM and lids are normal. Pupils are equal, round, and reactive to light.  Neck: Trachea normal and normal range of motion. Neck supple. Carotid bruit is not present. No thyroid mass and no thyromegaly present.  Cardiovascular: Normal rate, regular rhythm and normal heart sounds.  Pulmonary/Chest: Effort normal and breath sounds normal.  Abdominal: Soft. Normal appearance and bowel sounds are normal. There is no hepatosplenomegaly. There is no tenderness.  Musculoskeletal: Normal range of motion.  Lymphadenopathy:    She has no cervical adenopathy.    She has no axillary adenopathy.  Neurological: She is alert and oriented to person, place, and time. She has normal strength. No cranial nerve deficit.  Skin: Skin is warm, dry and intact.  Psychiatric: She has a normal mood and affect. Her speech is normal and behavior is normal. Judgment and thought content normal. Cognition and memory are normal.    Activities of Daily Living In your present state of health, do you have any difficulty performing the following activities: 05/06/2015 12/17/2014  Hearing? N N  Vision? N N  Difficulty concentrating or making decisions? N N  Walking or climbing stairs? Y Y  Dressing or bathing? Y N  Doing errands, shopping? Y N    Fall Risk Assessment Fall Risk  05/06/2015 07/16/2014  Falls in the past year? Yes Yes  Number falls in past yr: 2 or more 1  Injury with Fall? Yes Yes  Follow up - Falls prevention discussed     Depression Screen PHQ  2/9 Scores 05/06/2015 07/16/2014  PHQ - 2 Score 3 0  PHQ- 9 Score 11 -    Cognitive Testing - 6-CIT  Correct? Score   What year is it? yes 0 0 or 4  What month is it? yes 0 0 or 3  Memorize:    Pia Mau,  44,  [address]  [place],      What time is it? (within 1 hour) yes 0 0 or 3  Count backwards from 20 no 2 0, 2, or 4  Name the months of the year no 2 0, 2, or 4  Repeat name & address above no 5 0, 2, 4, 6, 8, or 10       TOTAL SCORE  9/28   Interpretation:  Abnormal- 9/28  Normal (0-7) Abnormal (8-28)       Assessment & Plan:     Annual Wellness Visit  Reviewed patient's Family Medical History Reviewed and updated list of patient's medical providers Assessment of cognitive impairment was done Assessed patient's functional ability Established a written schedule for health screening [facility] Completed and Reviewed  Exercise Activities and Dietary recommendations Goals    None      Immunization History  Administered Date(s) Administered  . Influenza, High Dose Seasonal PF 12/17/2014  . Pneumococcal Conjugate-13 11/16/2013  . Pneumococcal Polysaccharide-23 11/12/2007  . Tdap 03/10/2015  . Zoster 07/27/2010      1. Medicare annual wellness visit, subsequent Stable. Reviewed HCM as above.     2. Achilles tendinitis of right lower extremity Still walkling with a walker.  Currently not in therapy but plans to start new week with new PT group to work on gait training. Has fallen multiple times and needs fire department to come and get her up.  Patient advised to proceed with physical therapy and follow-up with Dr. Noemi Chapel. Discussed at length not giving up and trying to learn to do more things with the wlaker. Mood has been greatly affected by her immobility.    3. Gait difficulty As above. Continue to use walker.    4. Depression Worsening second to unable to walk without assistance. Patient advised to continue current medication and  plan of care.  Hopefully will improve with  increased mobility. Does not want to change medication or referral at this time.   5. Benign paroxysmal positional vertigo, unspecified laterality Stable. Patient to call if symptoms are worsening for ENT referral.  Discuss with PT also and see if they believe this may be part of her problem.     Patient seen and examined by Dr. Jerrell Belfast, and note scribed by Philbert Riser. [place], CMA.  I have reviewed the document for accuracy and completeness and I agree with above. Jerrell Belfast, MD   Margarita Rana, MD    ------------------------------------------------------------------------------------------------------------

## 2015-05-23 ENCOUNTER — Ambulatory Visit: Payer: Medicare Other | Attending: Orthopedic Surgery

## 2015-05-23 DIAGNOSIS — R2681 Unsteadiness on feet: Secondary | ICD-10-CM | POA: Insufficient documentation

## 2015-05-23 DIAGNOSIS — M6281 Muscle weakness (generalized): Secondary | ICD-10-CM | POA: Diagnosis present

## 2015-05-23 NOTE — Therapy (Signed)
[place] MAIN [facility] SERVICES [place], [place], [address] Phone: [phone]   Fax:  [phone]  Physical Therapy Evaluation  Patient Details  Name: Deborah [name] MRN: [id number] Date of Birth: 1930-07-25 No Data Recorded  Encounter Date: 05/23/2015      PT End of Session - 05/23/15 1706    Visit Number 1   Number of Visits 17   Date for PT Re-Evaluation 06/20/15   Authorization Type 1/10   PT Start Time 1600   PT Stop Time 1700   PT Time Calculation (min) 60 min   Equipment Utilized During Treatment Gait belt  RW   Activity Tolerance Patient limited by fatigue   Behavior During Therapy [facility] for tasks assessed/performed      Past Medical History  Diagnosis Date  . Hyperlipidemia   . Diabetes mellitus   . Palpitations   . Hypertension   . Osteoarthritis   . Contracture of palmar fascia   . Cough   . Anxiety   . Otitis externa   . Unspecified adjustment reaction   . Unspecified urinary incontinence   . Secondary hyperparathyroidism (of renal origin)   . Chronic kidney disease, stage III (moderate)   . Pure hypercholesterolemia   . Other specified acquired hypothyroidism   . Unspecified hereditary and idiopathic peripheral neuropathy   . Peripheral neuropathy ([place])   . Obesity   . Sleep apnea     Past Surgical History  Procedure Laterality Date  . Replacement total knee Bilateral   . Hand surgery Right   . Back surgery  1990    Ruptured Disc.  Marland Kitchen Shoulder surgery Right 06/2007  . Colonoscopy    . Cardiac catheterization  03/2008    There were no vitals filed for this visit.       Subjective Assessment - 05/23/15 1613    Subjective pt reports haivng achilles tendonitis and having to wear the boot on the R foot. pt reports having multiple falls over her boot since she had to wear it. she reports her ankle feels better but the falls have continued. she does feel like her legs have gotten weaker.    Pertinent History DM, morbid obesity, recent achilles tendonitis, HTN   How long can you stand comfortably? 1 min   How long can you walk comfortably? not far   Diagnostic tests CT scan of head   Patient Stated Goals be able to walk without the walker   Currently in Pain? No/denies            [facility] PT Assessment - 05/23/15 0001    Assessment   Medical Diagnosis Imbalance    Onset Date/Surgical Date 01/13/15   Prior Therapy [facility] PT   Precautions   Precautions Fall   Restrictions   Weight Bearing Restrictions No   Balance Screen   Has the patient fallen in the past 6 months Yes   How many times? 8   Has the patient had a decrease in activity level because of a fear of falling?  Yes   Is the patient reluctant to leave their home because of a fear of falling?  Yes   [place] residence   Living Arrangements Spouse/significant other   Available Help at Discharge Family   Type of [place] One level   [place] - 2 wheels   Prior Function  Level of Independence Independent with household mobility without device   Vocation Retired   Standardized Balance Assessment   Standardized Balance Assessment Five Times Sit to Stand;10 meter walk test;Berg Balance Test   Five times sit to stand comments  19s  from 18in surface   10 Meter Walk 0.20ms         POSTURE/OBSERVATION: Pt is morbidly obese. Poor posture. Pt at times inconsistent during history, may have STM loss. Husband supplements history   PROM/AROM: Limited R shoulder flexion (premorbid) STRENGTH:  Graded on a 0-5 scale Muscle Group Left Right  Shoulder flex    Shoulder Abd    Shoulder Ext    Shoulder IR/ER    Elbow    Wrist/hand     Hip Flex 4- 3  Hip Abd 2 2  Hip Add 2 2  Hip Ext    Hip IR/ER    Knee Flex 4- 4-  Knee Ext 4- 3+  Ankle DF 4- 3+  Ankle PF 2+ 2   SENSATION: Reduced light touch sensation below  the knees  SPECIAL TESTS: Rhomberg +  FUNCTIONAL MOBILITY: Pt requires AD for transfers. Pt has difficulty with rolling L/R which is premorbid per her husband   BALANCE: Impaired. Pt not able to perform Berg at this time due to poor endurance and unsteadiness causing her to be unsafe. Pt has impaired static and dynamic balance. Very unsteady  GAIT:  reduced cadence, RLE externally rotated, reduced stance time on the R. Pt easily faitgued.                   PT Education - 05/23/15 1705    Education provided Yes   Education Details plan of care, impairements found   Person(s) Educated Patient   Methods Explanation   Comprehension Verbalized understanding             PT Long Term Goals - 05/23/15 1756    PT LONG TERM GOAL #1   Title pt will be able to stand x 3 min without rest to improve ADL ability    Time 4   Period Weeks   Status New   PT LONG TERM GOAL #2   Title pt will be able to walk x 1073fwithout rest using LRAD to improve home mobility.    Time 8   Period Weeks   Status New   PT LONG TERM GOAL #3   Title pt will be able to perform sit to to stand from 15in surface using LRAD x 5 demonstrating improved LE strength.    Time 8   Period Weeks   Status New   PT LONG TERM GOAL #4   Title pt will reduce 5x sit to stand time by 10s from 18in surface using RW demonstrating improved LE strength.    Time 4   Period Weeks   Status New   PT LONG TERM GOAL #5   Title pt will improve 1086mlk speed by .69m/59memonstating improve mobility    Time 4   Period Weeks   Status New               Plan - 05/23/15 1752    Clinical Impression Statement pt presents 84 y67 F with history of morbid obesity, DM, HTN and recent bout of achilles tendonitis which required her to wear a boot for 12 weeks. pt ankle pain is gone and she has full ROM however she began to have falls when she wore  the boot. she has had 8 the past 3 months. pt demonstrates severe LE  weakness R>L, severe imbalance, impaired gait and endurance. pt was only able to walk about 51f before fatigue. pt  has been cleared by cardio. pt would benefit from skilled PT services to address deficits and restart pt on HEP to improve her deconditioning. pt does have historyof BPPV and she was inconsistant with c/o dizziness today, but at this time her strength, endurance and balance should be priority.    Rehab Potential Fair   Clinical Impairments Affecting Rehab Potential co-morbidities    PT Frequency 2x / week   PT Duration 8 weeks   PT Treatment/Interventions Aquatic Therapy;Canalith Repostioning;Cryotherapy;Electrical Stimulation;Moist Heat;Patient/family education;Neuromuscular re-education;Balance training;Therapeutic exercise;Therapeutic activities;Functional mobility training;Stair training;Gait training;Vestibular;Manual techniques   PT Next Visit Plan progress HEP      Patient will benefit from skilled therapeutic intervention in order to improve the following deficits and impairments:     Visit Diagnosis: Unsteadiness on feet - Plan: PT plan of care cert/re-cert  Muscle weakness (generalized) - Plan: PT plan of care cert/re-cert      G-Codes - 075/17/001759    Functional Assessment Tool Used 5xsittostand/197mlk   Functional Limitation Mobility: Walking and moving around   Mobility: Walking and Moving Around Current Status ([id number] least 60 percent but less than 80 percent impaired, limited or restricted   Mobility: Walking and Moving Around Goal Status ([phone] least 20 percent but less than 40 percent impaired, limited or restricted       Problem List Patient Active Problem List   Diagnosis Date Noted  . Depression 01/12/2015  . Shoulder pain 12/17/2014  . Pain in shoulder 08/05/2014  . Gonalgia 07/21/2014  . History of knee surgery 07/21/2014  . Allergic rhinitis 07/16/2014  . Anxiety 07/16/2014  . Benign hypertension 07/16/2014  . Alteration in bowel  elimination: incontinence 07/16/2014  . Chronic kidney disease (CKD), stage III (moderate) 07/16/2014  . Colitis 07/16/2014  . CN (constipation) 07/16/2014  . D (diarrhea) 07/16/2014  . Contracture of palmar fascia (Dupuytren's) 07/16/2014  . Hypercholesteremia 07/16/2014  . Hypoxia 07/16/2014  . Absence of bladder continence 07/16/2014  . Focal lymphocytic colitis 07/16/2014  . Extreme obesity ([place]06/04/2014  . Arthritis, degenerative 07/16/2014  . Disorder of peripheral nervous system ([facility]06/04/2014  . Neuralgia neuritis, sciatic nerve 07/16/2014  . Hyperparathyroidism, secondary ([facility]06/04/2014  . Apnea, sleep 07/16/2014  . Subclinical hypothyroidism 07/16/2014  . Diabetes mellitus, type 2 ([place]06/04/2014  . 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 Urinary incontinence without sensory awareness 09/03/2012  . Palpitations 12/28/2010  . SOB (shortness of breath) 12/28/2010  . Overweight 08/02/2009   AsGorden HarmsTortorici, PT, DPT #[phone],Jahmil Macleod 05/23/2015, 6:01 PM  CoCarrolltonAIN REPalomar Medical CenterERVICES [address]dFelicityNCAl[place]hone: [phone] Fax:  [phone]: Deborah BROERMANRN: [id number] of Birth: 11Apr 03, 1932

## 2015-05-23 NOTE — Patient Instructions (Signed)
[email or web address] Walk 1x/hr Sit to stand with RW from elevated surface 10x 3x/day LAQ 3x10 Seated PF 3x10 Seated march 1 min x 3

## 2015-05-26 ENCOUNTER — Ambulatory Visit: Payer: Medicare Other

## 2015-05-26 DIAGNOSIS — M6281 Muscle weakness (generalized): Secondary | ICD-10-CM

## 2015-05-26 DIAGNOSIS — R2681 Unsteadiness on feet: Secondary | ICD-10-CM

## 2015-05-26 NOTE — Therapy (Signed)
[place] MAIN [facility] SERVICES [address] [place], [place], [address] Phone: [phone]   Fax:  [phone]  Physical Therapy Treatment  Patient Details  Name: Deborah [name] MRN: [id number] Date of Birth: [birth year] No Data Recorded  Encounter Date: 05/26/2015      PT End of Session - 05/26/15 1605    Visit Number 2   Number of Visits 17   Date for PT Re-Evaluation 06/20/15   Authorization Type 2/10   PT Start Time 1515   PT Stop Time 1600   PT Time Calculation (min) 45 min   Equipment Utilized During Treatment Gait belt  RW   Activity Tolerance Patient limited by fatigue   Behavior During Therapy [facility] for tasks assessed/performed      Past Medical History  Diagnosis Date  . Hyperlipidemia   . Diabetes mellitus   . Palpitations   . Hypertension   . Osteoarthritis   . Contracture of palmar fascia   . Cough   . Anxiety   . Otitis externa   . Unspecified adjustment reaction   . Unspecified urinary incontinence   . Secondary hyperparathyroidism (of renal origin)   . Chronic kidney disease, stage III (moderate)   . Pure hypercholesterolemia   . Other specified acquired hypothyroidism   . Unspecified hereditary and idiopathic peripheral neuropathy   . Peripheral neuropathy ([place])   . Obesity   . Sleep apnea     Past Surgical History  Procedure Laterality Date  . Replacement total knee Bilateral   . Hand surgery Right   . Back surgery  1990    Ruptured Disc.  Marland Kitchen Shoulder surgery Right 06/2007  . Colonoscopy    . Cardiac catheterization  03/2008    There were no vitals filed for this visit.      Subjective Assessment - 05/26/15 1605    Subjective pt reports no new hx of fall   Pertinent History DM, morbid obesity, recent achilles tendonitis, HTN   How long can you stand comfortably? 1 min   How long can you walk comfortably? not far   Diagnostic tests CT scan of head   Patient Stated Goals be able to walk without  the walker            [facility] PT Assessment - 05/26/15 0001    Standardized Balance Assessment   Standardized Balance Assessment 10 meter walk test        therex: Standing mini squat 2x10; standing DF 2x10, standing hip abd 2x10; standing march 2x10; standing hip extension 2x10; seated ankle PF with red band 2x20 Seated march 2x30s Seated LAQ 2x10 Standing balance 15s no UE with CGA x 5 Pt requires mod verbal and tactile cues for proper exercise performance   pt needs seated rest breaks                    PT Education - 05/26/15 1606    Education provided Yes   Education Details HEP, walking program   Person(s) Educated Patient   Methods Explanation   Comprehension Verbalized understanding             PT Long Term Goals - 05/23/15 1756    PT LONG TERM GOAL #1   Title pt will be able to stand x 3 min without rest to improve ADL ability    Time 4   Period Weeks   Status New   PT LONG TERM GOAL #2   Title  pt will be able to walk x 142f without rest using LRAD to improve home mobility.    Time 8   Period Weeks   Status New   PT LONG TERM GOAL #3   Title pt will be able to perform sit to to stand from 15in surface using LRAD x 5 demonstrating improved LE strength.    Time 8   Period Weeks   Status New   PT LONG TERM GOAL #4   Title pt will reduce 5x sit to stand time by 10s from 18in surface using RW demonstrating improved LE strength.    Time 4   Period Weeks   Status New   PT LONG TERM GOAL #5   Title pt will improve 171malk speed by .48m44mdemonstating improve mobility    Time 4   Period Weeks   Status New               Plan - 05/26/15 1606    Clinical Impression Statement pt seems to have poor self awareness of her strength and mobility deficits. pt reports the exercises are "easy" but are visibly straining to her where she needs seated rest break. pt also needs mod cues to correct exercise and reinstruction of the task at hand.     Rehab Potential Fair   Clinical Impairments Affecting Rehab Potential co-morbidities    PT Frequency 2x / week   PT Duration 8 weeks   PT Treatment/Interventions Aquatic Therapy;Canalith Repostioning;Cryotherapy;Electrical Stimulation;Moist Heat;Patient/family education;Neuromuscular re-education;Balance training;Therapeutic exercise;Therapeutic activities;Functional mobility training;Stair training;Gait training;Vestibular;Manual techniques   PT Next Visit Plan progress HEP      Patient will benefit from skilled therapeutic intervention in order to improve the following deficits and impairments:     Visit Diagnosis: Muscle weakness (generalized)  Unsteadiness on feet     Problem List Patient Active Problem List   Diagnosis Date Noted  . Depression 01/12/2015  . Shoulder pain 12/17/2014  . Pain in shoulder 08/05/2014  . Gonalgia 07/21/2014  . History of knee surgery 07/21/2014  . Allergic rhinitis 07/16/2014  . Anxiety 07/16/2014  . Benign hypertension 07/16/2014  . Alteration in bowel elimination: incontinence 07/16/2014  . Chronic kidney disease (CKD), stage III (moderate) 07/16/2014  . Colitis 07/16/2014  . CN (constipation) 07/16/2014  . D (diarrhea) 07/16/2014  . Contracture of palmar fascia (Dupuytren's) 07/16/2014  . Hypercholesteremia 07/16/2014  . Hypoxia 07/16/2014  . Absence of bladder continence 07/16/2014  . Focal lymphocytic colitis 07/16/2014  . Extreme obesity (HCCIglesia Antigua6/04/2014  . Arthritis, degenerative 07/16/2014  . Disorder of peripheral nervous system (HCCRalls6/04/2014  . Neuralgia neuritis, sciatic nerve 07/16/2014  . Hyperparathyroidism, secondary (HCCHouston6/04/2014  . Apnea, sleep 07/16/2014  . Subclinical hypothyroidism 07/16/2014  . Diabetes mellitus, type 2 (HCCLondon6/04/2014  . 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 Urinary incontinence without sensory awareness 09/03/2012  . Palpitations 12/28/2010  . SOB (shortness of breath) 12/28/2010  .  Overweight 08/02/2009   AshGorden Harmsortorici, PT, DPT #[id number],Kyren Vaux 05/26/2015, 4:08 PM  Con[place] Union[place] REHCapital Regional Medical CenterRVICES [address] [place]one: [phone]:  [phone]: Deborah [name]: [phone] of Birth: [birth year]

## 2015-05-26 NOTE — Patient Instructions (Signed)
WALKING  Walking is a great form of exercise to increase your strength, endurance and overall fitness.  A walking program can help you start slowly and gradually build endurance as you go.  Everyone's ability is different, so each person's starting point will be different.  You do not have to follow them exactly.  The are just samples. You should simply find out what's right for you and stick to that program.   In the beginning, you'll start off walking 2-3 times a day for short distances.  As you get stronger, you'll be walking further at just 1-2 times per day.  A. You Can Walk For A Certain Length Of Time Each Day    Walk 5 minutes 3 times per day.  Increase 2 minutes every 2 days (3 times per day).  Work up to 25-30 minutes (1-2 times per day).   Example:   Day 1-2 5 minutes 3 times per day   Day 7-8 12 minutes 2-3 times per day   Day 13-14 25 minutes 1-2 times per day  B. You Can Walk For a Certain Distance Each Day     Distance can be substituted for time.    Example:   3 trips to mailbox (at road)   3 trips to corner of block   3 trips around the block

## 2015-05-31 ENCOUNTER — Ambulatory Visit (INDEPENDENT_AMBULATORY_CARE_PROVIDER_SITE_OTHER): Payer: Medicare Other | Admitting: Family Medicine

## 2015-05-31 ENCOUNTER — Ambulatory Visit: Admission: RE | Admit: 2015-05-31 | Payer: Medicare Other | Source: Ambulatory Visit | Admitting: *Deleted

## 2015-05-31 ENCOUNTER — Ambulatory Visit
Admission: RE | Admit: 2015-05-31 | Discharge: 2015-05-31 | Disposition: A | Discharge status: Home / Self Care | Payer: Medicare Other | Source: Ambulatory Visit | Attending: Family Medicine | Admitting: Family Medicine

## 2015-05-31 ENCOUNTER — Ambulatory Visit: Payer: Medicare Other

## 2015-05-31 ENCOUNTER — Encounter: Payer: Self-pay | Admitting: Family Medicine

## 2015-05-31 VITALS — BP 128/70 | HR 72 | Temp 98.4°F | Resp 16

## 2015-05-31 DIAGNOSIS — M6281 Muscle weakness (generalized): Secondary | ICD-10-CM

## 2015-05-31 DIAGNOSIS — K5900 Constipation, unspecified: Secondary | ICD-10-CM

## 2015-05-31 DIAGNOSIS — R2681 Unsteadiness on feet: Secondary | ICD-10-CM | POA: Diagnosis not present

## 2015-05-31 DIAGNOSIS — L918 Other hypertrophic disorders of the skin: Secondary | ICD-10-CM

## 2015-05-31 MED ORDER — MAGNESIUM CITRATE PO SOLN: ORAL | Status: DC

## 2015-05-31 MED ORDER — POLYETHYLENE GLYCOL 3350 17 GM/SCOOP PO POWD: 17.0000 g | Freq: Every day | ORAL | Status: DC

## 2015-05-31 MED ORDER — POLYETHYLENE GLYCOL 3350 17 GM/SCOOP PO POWD: 17.0000 g | Freq: Once | ORAL | Status: DC

## 2015-05-31 NOTE — Therapy (Signed)
[place] MAIN [facility] SERVICES [place], [place], [address] Phone: [phone]   Fax:  [phone]  Physical Therapy Treatment  Patient Details  Name: Deborah [name] MRN: [id number] Date of Birth: 15-Sep-1930 No Data Recorded  Encounter Date: 05/31/2015      PT End of Session - 05/31/15 1528    Visit Number 3   Number of Visits 17   Date for PT Re-Evaluation 06/20/15   Authorization Type 3/10   PT Start Time 1515   PT Stop Time 1600   PT Time Calculation (min) 45 min   Equipment Utilized During Treatment Gait belt  RW   Activity Tolerance Patient limited by fatigue   Behavior During Therapy [facility] for tasks assessed/performed      Past Medical History  Diagnosis Date  . Hyperlipidemia   . Diabetes mellitus   . Palpitations   . Hypertension   . Osteoarthritis   . Contracture of palmar fascia   . Cough   . Anxiety   . Otitis externa   . Unspecified adjustment reaction   . Unspecified urinary incontinence   . Secondary hyperparathyroidism (of renal origin)   . Chronic kidney disease, stage III (moderate)   . Pure hypercholesterolemia   . Other specified acquired hypothyroidism   . Unspecified hereditary and idiopathic peripheral neuropathy   . Peripheral neuropathy ([place])   . Obesity   . Sleep apnea     Past Surgical History  Procedure Laterality Date  . Replacement total knee Bilateral   . Hand surgery Right   . Back surgery  1990    Ruptured Disc.  Marland Kitchen Shoulder surgery Right 06/2007  . Colonoscopy    . Cardiac catheterization  03/2008    There were no vitals filed for this visit.      Subjective Assessment - 05/31/15 1528    Subjective pt reports she did her HEP all but 1 day    Pertinent History DM, morbid obesity, recent achilles tendonitis, HTN   How long can you stand comfortably? 1 min   How long can you walk comfortably? not far   Diagnostic tests CT scan of head   Patient Stated Goals be able to  walk without the walker   Currently in Pain? No/denies        Nustep L4  X 8 min with verbal cues to keep SPM>60 Sit to stand from elevated mat table without Ue 5x4 5lb ankle weights marching 2x10 5lb ankle weights LAQ 2x10 Fwd step up onto 6in step x 10 each leg Toe taps on box x20 with UE support Side stepping in // bars x 2 laps  pt requires CGA for safety on balance exercises  Pt requires mod verbal and tactile cues for proper exercise performance                                 PT Long Term Goals - 05/23/15 1756    PT LONG TERM GOAL #1   Title pt will be able to stand x 3 min without rest to improve ADL ability    Time 4   Period Weeks   Status New   PT LONG TERM GOAL #2   Title pt will be able to walk x 19f without rest using LRAD to improve home mobility.    Time 8   Period Weeks   Status New   PT LONG  TERM GOAL #3   Title pt will be able to perform sit to to stand from 15in surface using LRAD x 5 demonstrating improved LE strength.    Time 8   Period Weeks   Status New   PT LONG TERM GOAL #4   Title pt will reduce 5x sit to stand time by 10s from 18in surface using RW demonstrating improved LE strength.    Time 4   Period Weeks   Status New   PT LONG TERM GOAL #5   Title pt will improve 54mwalk speed by .227m demonstating improve mobility    Time 4   Period Weeks   Status New               Plan - 05/31/15 1559    Clinical Impression Statement pt was challenged by therex today. she needs verbal cues to continue with exercise at times. pt shows marked LLE weakness more than the R. pt does have some fear avoidance behaviors stating "I cant" for some exercises.    Rehab Potential Fair   Clinical Impairments Affecting Rehab Potential co-morbidities    PT Frequency 2x / week   PT Duration 8 weeks   PT Treatment/Interventions Aquatic Therapy;Canalith Repostioning;Cryotherapy;Electrical Stimulation;Moist Heat;Patient/family  education;Neuromuscular re-education;Balance training;Therapeutic exercise;Therapeutic activities;Functional mobility training;Stair training;Gait training;Vestibular;Manual techniques   PT Next Visit Plan progress HEP      Patient will benefit from skilled therapeutic intervention in order to improve the following deficits and impairments:     Visit Diagnosis: Unsteadiness on feet  Muscle weakness (generalized)     Problem List Patient Active Problem List   Diagnosis Date Noted  . Depression 01/12/2015  . Shoulder pain 12/17/2014  . Pain in shoulder 08/05/2014  . Gonalgia 07/21/2014  . History of knee surgery 07/21/2014  . Allergic rhinitis 07/16/2014  . Anxiety 07/16/2014  . Benign hypertension 07/16/2014  . Alteration in bowel elimination: incontinence 07/16/2014  . Chronic kidney disease (CKD), stage III (moderate) 07/16/2014  . Colitis 07/16/2014  . CN (constipation) 07/16/2014  . D (diarrhea) 07/16/2014  . Contracture of palmar fascia (Dupuytren's) 07/16/2014  . Hypercholesteremia 07/16/2014  . Hypoxia 07/16/2014  . Absence of bladder continence 07/16/2014  . Focal lymphocytic colitis 07/16/2014  . Extreme obesity (HCNew Baltimore06/04/2014  . Arthritis, degenerative 07/16/2014  . Disorder of peripheral nervous system (HCVenus06/04/2014  . Neuralgia neuritis, sciatic nerve 07/16/2014  . Hyperparathyroidism, secondary (HCForestville06/04/2014  . Apnea, sleep 07/16/2014  . Subclinical hypothyroidism 07/16/2014  . Diabetes mellitus, type 2 (HCMount Hebron06/04/2014  . 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 Urinary incontinence without sensory awareness 09/03/2012  . Palpitations 12/28/2010  . SOB (shortness of breath) 12/28/2010  . Overweight 08/02/2009   AsGorden HarmsTortorici, PT, DPT #[id number],Paolina Karwowski 05/31/2015, 4:01 PM  Co[place]ronogoAIN REChi Lisbon HealthERVICES [address]d[place]NC[place]hone: [phone] Fax:  [phone]:  Deborah BELMARESRN: [id number] of Birth: 1108-Apr-1932

## 2015-05-31 NOTE — Progress Notes (Signed)
Patient ID: Deborah [name], female   DOB: 02-21-1930, 82 y.o.   MRN: [id number]         Patient: Deborah [name] Female    DOB: 1930-06-16   82 y.o.   MRN: [id number] Visit Date: 05/31/2015  Today's Provider: Margarita Rana, MD   Chief Complaint  Patient presents with  . Diarrhea   Subjective:    Diarrhea  This is a chronic problem. The current episode started more than 1 month ago. Episode frequency: 5-6 times a day.  Pt says she has a Bowel Movement every time she urinates.  There has been times when she does not make it to the rest room.   The problem has been gradually worsening. Diarrhea characteristics: Pt reports that her bowels "are not watery, bloody, or tarry; but are always loose."  The patient states that diarrhea awakens (Only one time. ) her from sleep. Associated symptoms include increased flatus. Pertinent negatives include no abdominal pain, arthralgias, chills, coughing, fever, headaches, myalgias, sweats, URI, vomiting or weight loss. Nothing aggravates the symptoms. There are no known risk factors. Treatments tried: Stopped drinking milk. The treatment provided no relief.    Pt also needs a skin tag removed from the left side of her head.       Allergies  Allergen Reactions  . Ace Inhibitors     cough  . Ciprofloxacin Hcl   . Quinolones Other (See Comments)    Tendon rupture   Previous Medications   ACETAMINOPHEN (TYLENOL) 500 MG TABLET    Take 500 mg by mouth every 6 (six) hours as needed.   AMLODIPINE (NORVASC) 5 MG TABLET    TAKE 2 TABLETS BY MOUTH ONCE A DAY   ASPIRIN 81 MG TABLET    Take 81 mg by mouth daily.     BUPROPION (WELLBUTRIN SR) 150 MG 12 HR TABLET    Take 1 tablet (150 mg total) by mouth 2 (two) times daily.   CETIRIZINE HCL 10 MG CAPS    Take 1 capsule by mouth as needed.    ESCITALOPRAM (LEXAPRO) 20 MG TABLET    Take 1 tablet (20 mg total) by mouth daily.   GLIPIZIDE (GLUCOTROL) 5 MG TABLET    TAKE 1 TABLET BY MOUTH ONCE A DAY   GLYBURIDE  (DIABETA) 2.5 MG TABLET    Take 2.5 mg by mouth daily with breakfast.     HYDROCHLOROTHIAZIDE (MICROZIDE) 12.5 MG CAPSULE    TAKE 1 CAPSULE BY MOUTH ONCE DAILY   IMIPRAMINE (TOFRANIL) 25 MG TABLET    Take 25 mg by mouth 2 (two) times daily.    LORAZEPAM (ATIVAN) 0.5 MG TABLET    TAKE 1 TABLET BY MOUTH TWICE A DAY   LOSARTAN (COZAAR) 100 MG TABLET    Take 1 tablet (100 mg total) by mouth daily.   MELOXICAM (MOBIC) 7.5 MG TABLET    TAKE 1 TABLET BY MOUTH TWICE A DAY   SIMVASTATIN (ZOCOR) 20 MG TABLET    TAKE 1 TABLET BY MOUTH EVERY NIGHT AT BEDTIME    Review of Systems  Constitutional: Negative for fever, chills, weight loss, diaphoresis, activity change, appetite change, fatigue and unexpected weight change.  Respiratory: Negative for cough.   Gastrointestinal: Positive for diarrhea and flatus. Negative for nausea, vomiting, abdominal pain, constipation, blood in stool, abdominal distention, anal bleeding and rectal pain.  Musculoskeletal: Negative for myalgias and arthralgias.  Neurological: Positive for weakness. Negative for dizziness, light-headedness and headaches.    Social History  Substance Use Topics  . Smoking status: Former Smoker -- 10.00 packs/day for 1 years    Types: Cigarettes    Quit date: 12/27/1997  . Smokeless tobacco: Never Used  . Alcohol Use: No   Objective:   BP 128/70 mmHg  Pulse 72  Temp(Src) 98.4 F (36.9 C) (Oral)  Resp 16  Physical Exam  Constitutional: She is oriented to person, place, and time. She appears well-developed and well-nourished.  Cardiovascular: Normal rate and regular rhythm.   Pulmonary/Chest: Effort normal and breath sounds normal.  Abdominal: There is tenderness (in bliateral lower quadrants. ). There is no rebound and no guarding.  Protuberant.   Neurological: She is alert and oriented to person, place, and time.  Skin: Skin is warm and dry.  Skin Tag noted on left side of her forehead.    Psychiatric: She has a normal mood and  affect. Her behavior is normal. Judgment and thought content normal.      Assessment & Plan:      1. Constipation, unspecified constipation type Suspect cause of her loose stools.  Will obtain x-ray and treat as below.  Follow up in about four weeks. Patient instructed to call back if condition worsens or does not improve.     - DG Abd 2 Views - magnesium citrate SOLN; Drink 1/2 bottle then drink last half bottle six hours later.  Dispense: 195 mL; Refill: 0 - polyethylene glycol powder (GLYCOLAX/MIRALAX) powder; Take 17 g by mouth daily.  Dispense: 3350 g; Refill: 0  2. Skin tag Treated with cryopen today.    Patient was seen and examined by Jerrell Belfast, MD, and note scribed by Ashley Royalty, CMA.  I have reviewed the document for accuracy and completeness and I agree with above. - Jerrell Belfast, MD      Margarita Rana, MD  [facility]

## 2015-06-02 ENCOUNTER — Ambulatory Visit: Payer: Medicare Other

## 2015-06-02 DIAGNOSIS — R2681 Unsteadiness on feet: Secondary | ICD-10-CM

## 2015-06-02 DIAGNOSIS — M6281 Muscle weakness (generalized): Secondary | ICD-10-CM

## 2015-06-02 NOTE — Therapy (Signed)
[place] MAIN [facility] SERVICES [place], [place], [address] Phone: [phone]   Fax:  [phone]  Physical Therapy Treatment  Patient Details  Name: Deborah [name] MRN: [id number] Date of Birth: 10-29-1930 No Data Recorded  Encounter Date: 06/02/2015      PT End of Session - 06/02/15 1604    Visit Number 4   Number of Visits 17   Date for PT Re-Evaluation 06/20/15   Authorization Type 4/10   PT Start Time 1515   PT Stop Time 1600   PT Time Calculation (min) 45 min   Equipment Utilized During Treatment Gait belt  RW   Activity Tolerance Patient limited by fatigue   Behavior During Therapy [facility] for tasks assessed/performed      Past Medical History  Diagnosis Date  . Hyperlipidemia   . Diabetes mellitus   . Palpitations   . Hypertension   . Osteoarthritis   . Contracture of palmar fascia   . Cough   . Anxiety   . Otitis externa   . Unspecified adjustment reaction   . Unspecified urinary incontinence   . Secondary hyperparathyroidism (of renal origin)   . Chronic kidney disease, stage III (moderate)   . Pure hypercholesterolemia   . Other specified acquired hypothyroidism   . Unspecified hereditary and idiopathic peripheral neuropathy   . Peripheral neuropathy ([place])   . Obesity   . Sleep apnea     Past Surgical History  Procedure Laterality Date  . Replacement total knee Bilateral   . Hand surgery Right   . Back surgery  1990    Ruptured Disc.  Marland Kitchen Shoulder surgery Right 06/2007  . Colonoscopy    . Cardiac catheterization  03/2008    There were no vitals filed for this visit.      Subjective Assessment - 06/02/15 1526    Subjective pt reports she was tired after last session. she reports she is "just spinning her wheels" in regards to therapy, she doesnt believe she will every walk normal again. husband reports she has been pretty good doing her HEP   Pertinent History DM, morbid obesity, recent  achilles tendonitis, HTN   How long can you stand comfortably? 1 min   How long can you walk comfortably? not far   Diagnostic tests CT scan of head   Patient Stated Goals be able to walk without the walker         Nustep L 5 with verbal cues to stay >50 SPM x 5 min total Sit to stand from elevated mat table without Ue 5x4 Fwd step up onto 6in step x 10 each leg Side stepping in // bars x 3 laps Standing march 30s x 2 Walking in RW x 3 laps x 41f with CGA for safety. Min cues for longer step length and safety with turns pt requires CGA for safety on balance exercises  Pt requires mod verbal and tactile cues for proper exercise performance pt requires frequent seated rest breaks                             PT Long Term Goals - 05/23/15 1756    PT LONG TERM GOAL #1   Title pt will be able to stand x 3 min without rest to improve ADL ability    Time 4   Period Weeks   Status New   PT LONG TERM GOAL #2  Title pt will be able to walk x 163f without rest using LRAD to improve home mobility.    Time 8   Period Weeks   Status New   PT LONG TERM GOAL #3   Title pt will be able to perform sit to to stand from 15in surface using LRAD x 5 demonstrating improved LE strength.    Time 8   Period Weeks   Status New   PT LONG TERM GOAL #4   Title pt will reduce 5x sit to stand time by 10s from 18in surface using RW demonstrating improved LE strength.    Time 4   Period Weeks   Status New   PT LONG TERM GOAL #5   Title pt will improve 184malk speed by .80m30mdemonstating improve mobility    Time 4   Period Weeks   Status New               Plan - 06/02/15 1605    Clinical Impression Statement pt seems to be progressing well in therapy. she was able to walk 72f58fthout stopping x 3 and able to walk with longer strides. pt does not get as fatigued with exercise.    Rehab Potential Fair   Clinical Impairments Affecting Rehab Potential  co-morbidities    PT Frequency 2x / week   PT Duration 8 weeks   PT Treatment/Interventions Aquatic Therapy;Canalith Repostioning;Cryotherapy;Electrical Stimulation;Moist Heat;Patient/family education;Neuromuscular re-education;Balance training;Therapeutic exercise;Therapeutic activities;Functional mobility training;Stair training;Gait training;Vestibular;Manual techniques   PT Next Visit Plan progress HEP      Patient will benefit from skilled therapeutic intervention in order to improve the following deficits and impairments:     Visit Diagnosis: Unsteadiness on feet  Muscle weakness (generalized)     Problem List Patient Active Problem List   Diagnosis Date Noted  . Depression 01/12/2015  . Shoulder pain 12/17/2014  . Pain in shoulder 08/05/2014  . Gonalgia 07/21/2014  . History of knee surgery 07/21/2014  . Allergic rhinitis 07/16/2014  . Anxiety 07/16/2014  . Benign hypertension 07/16/2014  . Alteration in bowel elimination: incontinence 07/16/2014  . Chronic kidney disease (CKD), stage III (moderate) 07/16/2014  . Colitis 07/16/2014  . CN (constipation) 07/16/2014  . D (diarrhea) 07/16/2014  . Contracture of palmar fascia (Dupuytren's) 07/16/2014  . Hypercholesteremia 07/16/2014  . Hypoxia 07/16/2014  . Absence of bladder continence 07/16/2014  . Focal lymphocytic colitis 07/16/2014  . Extreme obesity (HCC)[place]/04/2014  . Arthritis, degenerative 07/16/2014  . Disorder of peripheral nervous system (HCC)[place]/04/2014  . Neuralgia neuritis, sciatic nerve 07/16/2014  . Hyperparathyroidism, secondary (HCC)[place]/04/2014  . Apnea, sleep 07/16/2014  . Subclinical hypothyroidism 07/16/2014  . Diabetes mellitus, type 2 (HCC)[place]/04/2014  . 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 Urinary incontinence without sensory awareness 09/03/2012  . Palpitations 12/28/2010  . SOB (shortness of breath) 12/28/2010  . Overweight 08/02/2009   AshlGorden Harmsrtorici, PT, DPT  #[id number],Deborah [name] 06/02/2015, 4:06 PM  [facility]oneOpdyke WestN REHATallahassee Endoscopy CenterVICES [id number], [place]ne: [phone]:  [phone]: Deborah [name]: [phone] of Birth: [birth year]

## 2015-06-03 ENCOUNTER — Ambulatory Visit
Admission: RE | Admit: 2015-06-03 | Discharge: 2015-06-03 | Disposition: A | Discharge status: Home / Self Care | Payer: Medicare Other | Source: Ambulatory Visit | Attending: Family Medicine | Admitting: Family Medicine

## 2015-06-03 DIAGNOSIS — M5136 Other intervertebral disc degeneration, lumbar region: Secondary | ICD-10-CM | POA: Diagnosis not present

## 2015-06-03 DIAGNOSIS — K5900 Constipation, unspecified: Secondary | ICD-10-CM | POA: Diagnosis not present

## 2015-06-04 ENCOUNTER — Telehealth: Payer: Self-pay

## 2015-06-04 NOTE — Telephone Encounter (Signed)
NA. LMTCB. sd

## 2015-06-04 NOTE — Telephone Encounter (Signed)
-----   Message from Margarita Rana, MD sent at 06/04/2015  9:13 AM EDT ----- Xray normal. Does have quite a bit of stool in colon still. Please see how she is doing.  Thanks.

## 2015-06-07 ENCOUNTER — Telehealth: Payer: Self-pay | Admitting: Family Medicine

## 2015-06-07 ENCOUNTER — Ambulatory Visit: Payer: Medicare Other

## 2015-06-07 DIAGNOSIS — R2681 Unsteadiness on feet: Secondary | ICD-10-CM | POA: Diagnosis not present

## 2015-06-07 DIAGNOSIS — M6281 Muscle weakness (generalized): Secondary | ICD-10-CM

## 2015-06-07 NOTE — Telephone Encounter (Signed)
Advised patient as below.  

## 2015-06-07 NOTE — Therapy (Signed)
[place] MAIN [facility] SERVICES [place], [place], [address] Phone: [phone]   Fax:  [phone]  Physical Therapy Treatment  Patient Details  Name: Deborah [name] MRN: [id number] Date of Birth: 06/15/1930 No Data Recorded  Encounter Date: 06/07/2015      PT End of Session - 06/07/15 1651    Visit Number 5   Number of Visits 17   Date for PT Re-Evaluation 06/20/15   Authorization Type 5/10   PT Start Time 1515   PT Stop Time 1600   PT Time Calculation (min) 45 min   Equipment Utilized During Treatment Gait belt  RW   Activity Tolerance Patient limited by fatigue   Behavior During Therapy [facility] for tasks assessed/performed      Past Medical History  Diagnosis Date  . Hyperlipidemia   . Diabetes mellitus   . Palpitations   . Hypertension   . Osteoarthritis   . Contracture of palmar fascia   . Cough   . Anxiety   . Otitis externa   . Unspecified adjustment reaction   . Unspecified urinary incontinence   . Secondary hyperparathyroidism (of renal origin)   . Chronic kidney disease, stage III (moderate)   . Pure hypercholesterolemia   . Other specified acquired hypothyroidism   . Unspecified hereditary and idiopathic peripheral neuropathy   . Peripheral neuropathy ([place])   . Obesity   . Sleep apnea     Past Surgical History  Procedure Laterality Date  . Replacement total knee Bilateral   . Hand surgery Right   . Back surgery  1990    Ruptured Disc.  Marland Kitchen Shoulder surgery Right 06/2007  . Colonoscopy    . Cardiac catheterization  03/2008    There were no vitals filed for this visit.      Subjective Assessment - 06/07/15 1650    Subjective pt reports she has been exercising at home   Pertinent History DM, morbid obesity, recent achilles tendonitis, HTN   How long can you stand comfortably? 1 min   How long can you walk comfortably? not far   Diagnostic tests CT scan of head   Patient Stated Goals be able to  walk without the walker   Currently in Pain? No/denies      Gait training: 2x169f ambulation with RW . SBA for safety, cues for longer step length and safety during turns. Pt needs 3 min rest between sessions  ThereX: Sit to stand from lowered mat table no UE with 2s hold at top 4x5 Standing hip circles x 10 each leg. Pt needs max cues Fwd / retro ambulation in // bars single UE. Cues for longer step length. SBA for safety x 5 laps  NMR: Standing balance in // bars no Ue  1 min x 2 Standing balance in // bars with SS and AP weight shift with mod / max tactile cues and verbal cues x 20 each way  Staggered stance fwd/back weight shift x 20 each side with mod/max tactile cues and min A for safety and exercise performance.                            PT Education - 06/07/15 1650    Education provided Yes   Education Details continues HEP as tolerated.    Person(s) Educated Patient   Methods Explanation   Comprehension Verbalized understanding  PT Long Term Goals - 05/23/15 1756    PT LONG TERM GOAL #1   Title pt will be able to stand x 3 min without rest to improve ADL ability    Time 4   Period Weeks   Status New   PT LONG TERM GOAL #2   Title pt will be able to walk x 160f without rest using LRAD to improve home mobility.    Time 8   Period Weeks   Status New   PT LONG TERM GOAL #3   Title pt will be able to perform sit to to stand from 15in surface using LRAD x 5 demonstrating improved LE strength.    Time 8   Period Weeks   Status New   PT LONG TERM GOAL #4   Title pt will reduce 5x sit to stand time by 10s from 18in surface using RW demonstrating improved LE strength.    Time 4   Period Weeks   Status New   PT LONG TERM GOAL #5   Title pt will improve 120malk speed by .20m68mdemonstating improve mobility    Time 4   Period Weeks   Status New               Plan - 06/07/15 1651    Clinical Impression Statement pt  is progressing well through therapy. pt able to walk 180f58fthout stopping x 2 today with good follow through with cues for longer step length. pt needs mod - max cues to do standing exercises properly. pt has poor performance of demonstrated exercises.    Rehab Potential Fair   Clinical Impairments Affecting Rehab Potential co-morbidities    PT Frequency 2x / week   PT Duration 8 weeks   PT Treatment/Interventions Aquatic Therapy;Canalith Repostioning;Cryotherapy;Electrical Stimulation;Moist Heat;Patient/family education;Neuromuscular re-education;Balance training;Therapeutic exercise;Therapeutic activities;Functional mobility training;Stair training;Gait training;Vestibular;Manual techniques   PT Next Visit Plan progress HEP      Patient will benefit from skilled therapeutic intervention in order to improve the following deficits and impairments:  Abnormal gait, Decreased activity tolerance, Decreased balance, Decreased endurance, Impaired sensation, Decreased strength, Difficulty walking, Decreased mobility  Visit Diagnosis: Unsteadiness on feet  Muscle weakness (generalized)     Problem List Patient Active Problem List   Diagnosis Date Noted  . Depression 01/12/2015  . Shoulder pain 12/17/2014  . Pain in shoulder 08/05/2014  . Gonalgia 07/21/2014  . History of knee surgery 07/21/2014  . Allergic rhinitis 07/16/2014  . Anxiety 07/16/2014  . Benign hypertension 07/16/2014  . Alteration in bowel elimination: incontinence 07/16/2014  . Chronic kidney disease (CKD), stage III (moderate) 07/16/2014  . Colitis 07/16/2014  . CN (constipation) 07/16/2014  . D (diarrhea) 07/16/2014  . Contracture of palmar fascia (Dupuytren's) 07/16/2014  . Hypercholesteremia 07/16/2014  . Hypoxia 07/16/2014  . Absence of bladder continence 07/16/2014  . Focal lymphocytic colitis 07/16/2014  . Extreme obesity (HCC)[place]/04/2014  . Arthritis, degenerative 07/16/2014  . Disorder of peripheral  nervous system (HCC)[place]/04/2014  . Neuralgia neuritis, sciatic nerve 07/16/2014  . Hyperparathyroidism, secondary (HCC)[place]/04/2014  . Apnea, sleep 07/16/2014  . Subclinical hypothyroidism 07/16/2014  . Diabetes mellitus, type 2 (HCC)[place]/04/2014  . 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 Urinary incontinence without sensory awareness 09/03/2012  . Palpitations 12/28/2010  . SOB (shortness of breath) 12/28/2010  . Overweight 08/02/2009   AshlGorden Harmsrtorici, PT, DPT #[id number] [phone],Deanndra Kirley 06/07/2015, 4:54 PM  [facility] [address]  [place], [place], [address] Phone: [phone]   Fax:  [phone]  Name: Deborah [name] MRN: [id number] Date of Birth: [birth year]

## 2015-06-09 ENCOUNTER — Ambulatory Visit: Payer: Medicare Other

## 2015-06-09 DIAGNOSIS — R2681 Unsteadiness on feet: Secondary | ICD-10-CM

## 2015-06-09 DIAGNOSIS — M6281 Muscle weakness (generalized): Secondary | ICD-10-CM

## 2015-06-09 NOTE — Therapy (Signed)
[place] MAIN [facility] SERVICES [place], [place], [address] Phone: [phone]   Fax:  [phone]  Physical Therapy Treatment  Patient Details  Name: Deborah [name] MRN: [id number] Date of Birth: [birth year] No Data Recorded  Encounter Date: 06/09/2015      PT End of Session - 06/09/15 1600    Visit Number 6   Number of Visits 17   Date for PT Re-Evaluation 06/20/15   Authorization Type 6/10   PT Start Time 1515   PT Stop Time 1558   PT Time Calculation (min) 43 min   Equipment Utilized During Treatment Gait belt  RW   Activity Tolerance Patient tolerated treatment well   Behavior During Therapy WFL for tasks assessed/performed      Past Medical History  Diagnosis Date  . Hyperlipidemia   . Diabetes mellitus   . Palpitations   . Hypertension   . Osteoarthritis   . Contracture of palmar fascia   . Cough   . Anxiety   . Otitis externa   . Unspecified adjustment reaction   . Unspecified urinary incontinence   . Secondary hyperparathyroidism (of renal origin)   . Chronic kidney disease, stage III (moderate)   . Pure hypercholesterolemia   . Other specified acquired hypothyroidism   . Unspecified hereditary and idiopathic peripheral neuropathy   . Peripheral neuropathy ([place])   . Obesity   . Sleep apnea     Past Surgical History  Procedure Laterality Date  . Replacement total knee Bilateral   . Hand surgery Right   . Back surgery  1990    Ruptured Disc.  Marland Kitchen Shoulder surgery Right 06/2007  . Colonoscopy    . Cardiac catheterization  03/2008    There were no vitals filed for this visit.      Subjective Assessment - 06/09/15 1559    Subjective pt reports she is tired after therapy apts.    Pertinent History DM, morbid obesity, recent achilles tendonitis, HTN   How long can you stand comfortably? 1 min   How long can you walk comfortably? not far   Diagnostic tests CT scan of head   Patient Stated Goals be  able to walk without the walker   Currently in Pain? No/denies       Therex: Walking with RW x 19f min cues for longer step length. SBA for safty Nustep L4 x 6 min with cues to keep SPM>50 Seated LAQ 2.5lbs 2x15 Standing hip abd 2.5lbs 2x10 Standing toe taps on box 1 UE 2.5lbs 3x10 Standing march 2.5lbs 3x10 Fwd step up 2xUE x 10 each leg Pt requires mod-max verbal and tactile cues for proper exercise performance                            PT Education - 06/09/15 1559    Education provided Yes   Education Details pt needs reinstruction of exercises during session    Person(s) Educated Patient   Methods Explanation   Comprehension Verbalized understanding             PT Long Term Goals - 05/23/15 1756    PT LONG TERM GOAL #1   Title pt will be able to stand x 3 min without rest to improve ADL ability    Time 4   Period Weeks   Status New   PT LONG TERM GOAL #2   Title pt will be able to  walk x 16f without rest using LRAD to improve home mobility.    Time 8   Period Weeks   Status New   PT LONG TERM GOAL #3   Title pt will be able to perform sit to to stand from 15in surface using LRAD x 5 demonstrating improved LE strength.    Time 8   Period Weeks   Status New   PT LONG TERM GOAL #4   Title pt will reduce 5x sit to stand time by 10s from 18in surface using RW demonstrating improved LE strength.    Time 4   Period Weeks   Status New   PT LONG TERM GOAL #5   Title pt will improve 1733malk speed by .33m44mdemonstating improve mobility    Time 4   Period Weeks   Status New               Plan - 06/09/15 1600    Clinical Impression Statement pt tolerated progression of resisted standing exercises well. she was fatigued but able to do so with less rest breaks. pt seemed more confused during session needing mod-max cues and repeated instructions of exercises.    Rehab Potential Fair   Clinical Impairments Affecting Rehab Potential  co-morbidities    PT Frequency 2x / week   PT Duration 8 weeks   PT Treatment/Interventions Aquatic Therapy;Canalith Repostioning;Cryotherapy;Electrical Stimulation;Moist Heat;Patient/family education;Neuromuscular re-education;Balance training;Therapeutic exercise;Therapeutic activities;Functional mobility training;Stair training;Gait training;Vestibular;Manual techniques      Patient will benefit from skilled therapeutic intervention in order to improve the following deficits and impairments:  Abnormal gait, Decreased activity tolerance, Decreased balance, Decreased endurance, Impaired sensation, Decreased strength, Difficulty walking, Decreased mobility  Visit Diagnosis: Unsteadiness on feet  Muscle weakness (generalized)     Problem List Patient Active Problem List   Diagnosis Date Noted  . Depression 01/12/2015  . Shoulder pain 12/17/2014  . Pain in shoulder 08/05/2014  . Gonalgia 07/21/2014  . History of knee surgery 07/21/2014  . Allergic rhinitis 07/16/2014  . Anxiety 07/16/2014  . Benign hypertension 07/16/2014  . Alteration in bowel elimination: incontinence 07/16/2014  . Chronic kidney disease (CKD), stage III (moderate) 07/16/2014  . Colitis 07/16/2014  . CN (constipation) 07/16/2014  . D (diarrhea) 07/16/2014  . Contracture of palmar fascia (Dupuytren's) 07/16/2014  . Hypercholesteremia 07/16/2014  . Hypoxia 07/16/2014  . Absence of bladder continence 07/16/2014  . Focal lymphocytic colitis 07/16/2014  . Extreme obesity (HCCCalcutta6/04/2014  . Arthritis, degenerative 07/16/2014  . Disorder of peripheral nervous system (HCCVaiden6/04/2014  . Neuralgia neuritis, sciatic nerve 07/16/2014  . Hyperparathyroidism, secondary (HCCTangipahoa6/04/2014  . Apnea, sleep 07/16/2014  . Subclinical hypothyroidism 07/16/2014  . Diabetes mellitus, type 2 (HCCSuitland6/04/2014  . 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 Urinary incontinence without sensory awareness 09/03/2012  . Palpitations  12/28/2010  . SOB (shortness of breath) 12/28/2010  . Overweight 08/02/2009   AshGorden Harmsortorici, PT, DPT #[phone],Huy Majid 06/09/2015, 4:07 PM  [facility] RE[facility] One[facility]ICES [address] [place]one: [phone]:  [phone]: BetROSENDA GEFFRARDN: [id number] of Birth: [birth year]

## 2015-06-14 ENCOUNTER — Ambulatory Visit: Payer: Medicare Other

## 2015-06-16 ENCOUNTER — Ambulatory Visit: Payer: Medicare Other | Attending: Orthopedic Surgery

## 2015-06-16 DIAGNOSIS — M6281 Muscle weakness (generalized): Secondary | ICD-10-CM | POA: Insufficient documentation

## 2015-06-16 DIAGNOSIS — R2681 Unsteadiness on feet: Secondary | ICD-10-CM | POA: Diagnosis present

## 2015-06-16 NOTE — Therapy (Signed)
[place] MAIN [facility] SERVICES [address] [place], [place], [address] Phone: [phone]   Fax:  [phone]  Physical Therapy Treatment  Patient Details  Name: Deborah [name] MRN: [id number] Date of Birth: 14-Apr-1930 No Data Recorded  Encounter Date: 06/16/2015      PT End of Session - 06/16/15 1442    Visit Number 7   Number of Visits 17   Date for PT Re-Evaluation 06/20/15   Authorization Type 7/10   PT Start Time 1350   PT Stop Time 1430   PT Time Calculation (min) 40 min   Equipment Utilized During Treatment Gait belt  RW   Activity Tolerance Patient tolerated treatment well   Behavior During Therapy WFL for tasks assessed/performed      Past Medical History  Diagnosis Date  . Hyperlipidemia   . Diabetes mellitus   . Palpitations   . Hypertension   . Osteoarthritis   . Contracture of palmar fascia   . Cough   . Anxiety   . Otitis externa   . Unspecified adjustment reaction   . Unspecified urinary incontinence   . Secondary hyperparathyroidism (of renal origin)   . Chronic kidney disease, stage III (moderate)   . Pure hypercholesterolemia   . Other specified acquired hypothyroidism   . Unspecified hereditary and idiopathic peripheral neuropathy   . Peripheral neuropathy ([place])   . Obesity   . Sleep apnea     Past Surgical History  Procedure Laterality Date  . Replacement total knee Bilateral   . Hand surgery Right   . Back surgery  1990    Ruptured Disc.  Marland Kitchen Shoulder surgery Right 06/2007  . Colonoscopy    . Cardiac catheterization  03/2008    There were no vitals filed for this visit.      Subjective Assessment - 06/16/15 1440    Subjective pt reports she has been doing her HEP   Pertinent History DM, morbid obesity, recent achilles tendonitis, HTN   How long can you stand comfortably? 1 min   How long can you walk comfortably? not far   Diagnostic tests CT scan of head   Patient Stated Goals be able to  walk without the walker   Currently in Pain? No/denies      Therex:  261f ambulation with RW x 2 rounds. SBA for safety. Cues for increased step length.   fwd step up onto 6in step 2 hand rail x 10 each leg. Min A needed when usin gLLE Toe taps 2 UE support x 20 onto 6in step Toe taps using 1 UE x 20 on 6in step Standing balance 30s x 5 Standing balance with head turn 1x194m  pt requires CGA for safety on balance exercises  Pt requires mod verbal and tactile cues for proper exercise performance                                PT Long Term Goals - 05/23/15 1756    PT LONG TERM GOAL #1   Title pt will be able to stand x 3 min without rest to improve ADL ability    Time 4   Period Weeks   Status New   PT LONG TERM GOAL #2   Title pt will be able to walk x 10053fithout rest using LRAD to improve home mobility.    Time 8   Period Weeks   Status  New   PT LONG TERM GOAL #3   Title pt will be able to perform sit to to stand from 15in surface using LRAD x 5 demonstrating improved LE strength.    Time 8   Period Weeks   Status New   PT LONG TERM GOAL #4   Title pt will reduce 5x sit to stand time by 10s from 18in surface using RW demonstrating improved LE strength.    Time 4   Period Weeks   Status New   PT LONG TERM GOAL #5   Title pt will improve 63mwalk speed by .217m demonstating improve mobility    Time 4   Period Weeks   Status New               Plan - 06/16/15 1453    Clinical Impression Statement pt did fair with progression of therex today. pt needs frequent reinstruction of exercise. pt demonstrates better standing endurance however.    Rehab Potential Fair   Clinical Impairments Affecting Rehab Potential co-morbidities    PT Frequency 2x / week   PT Duration 8 weeks   PT Treatment/Interventions Aquatic Therapy;Canalith Repostioning;Cryotherapy;Electrical Stimulation;Moist Heat;Patient/family education;Neuromuscular  re-education;Balance training;Therapeutic exercise;Therapeutic activities;Functional mobility training;Stair training;Gait training;Vestibular;Manual techniques      Patient will benefit from skilled therapeutic intervention in order to improve the following deficits and impairments:  Abnormal gait, Decreased activity tolerance, Decreased balance, Decreased endurance, Impaired sensation, Decreased strength, Difficulty walking, Decreased mobility  Visit Diagnosis: Unsteadiness on feet  Muscle weakness (generalized)     Problem List Patient Active Problem List   Diagnosis Date Noted  . Depression 01/12/2015  . Shoulder pain 12/17/2014  . Pain in shoulder 08/05/2014  . Gonalgia 07/21/2014  . History of knee surgery 07/21/2014  . Allergic rhinitis 07/16/2014  . Anxiety 07/16/2014  . Benign hypertension 07/16/2014  . Alteration in bowel elimination: incontinence 07/16/2014  . Chronic kidney disease (CKD), stage III (moderate) 07/16/2014  . Colitis 07/16/2014  . CN (constipation) 07/16/2014  . D (diarrhea) 07/16/2014  . Contracture of palmar fascia (Dupuytren's) 07/16/2014  . Hypercholesteremia 07/16/2014  . Hypoxia 07/16/2014  . Absence of bladder continence 07/16/2014  . Focal lymphocytic colitis 07/16/2014  . Extreme obesity (HCRevere06/04/2014  . Arthritis, degenerative 07/16/2014  . Disorder of peripheral nervous system (HCDyckesville06/04/2014  . Neuralgia neuritis, sciatic nerve 07/16/2014  . Hyperparathyroidism, secondary (HCGunnison06/04/2014  . Apnea, sleep 07/16/2014  . Subclinical hypothyroidism 07/16/2014  . Diabetes mellitus, type 2 (HCWaldron06/04/2014  . 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 Urinary incontinence without sensory awareness 09/03/2012  . Palpitations 12/28/2010  . SOB (shortness of breath) 12/28/2010  . Overweight 08/02/2009   AsGorden HarmsTortorici, PT, DPT [id number],Joniece Smotherman 06/16/2015, 2:55 PM  CoHowey-in-the-HillsAIN REKindred Hospital - ChattanoogaSERVICES [address]one: [phone] Fax:  [phone]: Deborah PROCIDARN: [id number] of Birth: [birth year]

## 2015-06-20 ENCOUNTER — Other Ambulatory Visit: Payer: Self-pay | Admitting: Family Medicine

## 2015-06-20 DIAGNOSIS — I10 Essential (primary) hypertension: Secondary | ICD-10-CM

## 2015-06-21 ENCOUNTER — Ambulatory Visit: Payer: Medicare Other

## 2015-06-21 DIAGNOSIS — R2681 Unsteadiness on feet: Secondary | ICD-10-CM

## 2015-06-21 DIAGNOSIS — M6281 Muscle weakness (generalized): Secondary | ICD-10-CM

## 2015-06-21 NOTE — Therapy (Signed)
[place] MAIN [facility] SERVICES [place], [place], [address] Phone: [phone]   Fax:  [phone]  Physical Therapy Treatment/ progress note  Patient Details  Name: Deborah [name] MRN: [id number] Date of Birth: [birth year] No Data Recorded  Encounter Date: 06/21/2015      PT End of Session - 06/21/15 1627    Visit Number 8   Number of Visits 25   Date for PT Re-Evaluation 07/19/15   Authorization Type 1/10   PT Start Time 1515   PT Stop Time 1555   PT Time Calculation (min) 40 min   Equipment Utilized During Treatment Gait belt  RW   Activity Tolerance Patient tolerated treatment well   Behavior During Therapy [facility] for tasks assessed/performed      Past Medical History  Diagnosis Date  . Hyperlipidemia   . Diabetes mellitus   . Palpitations   . Hypertension   . Osteoarthritis   . Contracture of palmar fascia   . Cough   . Anxiety   . Otitis externa   . Unspecified adjustment reaction   . Unspecified urinary incontinence   . Secondary hyperparathyroidism (of renal origin)   . Chronic kidney disease, stage III (moderate)   . Pure hypercholesterolemia   . Other specified acquired hypothyroidism   . Unspecified hereditary and idiopathic peripheral neuropathy   . Peripheral neuropathy ([place])   . Obesity   . Sleep apnea     Past Surgical History  Procedure Laterality Date  . Replacement total knee Bilateral   . Hand surgery Right   . Back surgery  1990    Ruptured Disc.  Marland Kitchen Shoulder surgery Right 06/2007  . Colonoscopy    . Cardiac catheterization  03/2008    There were no vitals filed for this visit.      Subjective Assessment - 06/21/15 1627    Subjective pt reports she didnt do much HEP this weekend   Pertinent History DM, morbid obesity, recent achilles tendonitis, HTN   How long can you stand comfortably? 1 min   How long can you walk comfortably? not far   Diagnostic tests CT scan of head   Patient  Stated Goals be able to walk without the walker      Pt assessed goals and progress with outcome measures as follows      [facility] PT Assessment - 06/21/15 0001    Standardized Balance Assessment   Five times sit to stand comments  18.5s   10 Meter Walk 0.24ms     pt ambulated with RW x 2567fwith SBA x 1 lap Pt unable to stand from 15in chair without +1 mod A Standing balance x 3 min with 1 UE support Standing balnace without UE support 30s x 3 Standing balance without UE support with trunk turn x 30s   pt requires CGA for safety on balance exercises                          PT Education - 06/21/15 1627    Education provided Yes   Education Details emphasized HEP to maximize strengthening    Person(s) Educated Patient   Methods Explanation   Comprehension Verbalized understanding             PT Long Term Goals - 06/21/15 1630    PT LONG TERM GOAL #1   Title pt will be able to stand x 3 min without rest to  improve ADL ability    Time 4   Period Weeks   Status Achieved   PT LONG TERM GOAL #2   Title pt will be able to walk x 124f without rest using LRAD to improve home mobility.    Time 8   Period Weeks   Status Achieved   PT LONG TERM GOAL #3   Title pt will be able to perform sit to to stand from 15in surface using LRAD x 5 demonstrating improved LE strength.    Time 8   Period Weeks   Status On-going   PT LONG TERM GOAL #4   Title pt will reduce 5x sit to stand time by 10s from 18in surface using RW demonstrating improved LE strength.    Time 4   Period Weeks   Status On-going   PT LONG TERM GOAL #5   Title pt will improve 176malk speed by .58m51mdemonstating improve mobility    Time 4   Period Weeks   Status Achieved               Plan - 05/June 04, 201728    Clinical Impression Statement pt has made some progress towards her goals. her standing tolerance and balance have improved, her endurance with walking has improved as well.  her functional LE strength seems relatively unchanged based on 5x sit to stand. pt admists not being very compliant ot HEP. pt would benefit from continued skilled PT services to further address goals as they are partially met at this time.    Rehab Potential Fair   Clinical Impairments Affecting Rehab Potential co-morbidities    PT Frequency 2x / week   PT Duration 8 weeks   PT Treatment/Interventions Aquatic Therapy;Canalith Repostioning;Cryotherapy;Electrical Stimulation;Moist Heat;Patient/family education;Neuromuscular re-education;Balance training;Therapeutic exercise;Therapeutic activities;Functional mobility training;Stair training;Gait training;Vestibular;Manual techniques      Patient will benefit from skilled therapeutic intervention in order to improve the following deficits and impairments:  Abnormal gait, Decreased activity tolerance, Decreased balance, Decreased endurance, Impaired sensation, Decreased strength, Difficulty walking, Decreased mobility  Visit Diagnosis: Muscle weakness (generalized) - Plan: PT plan of care cert/re-cert  Unsteadiness on feet - Plan: PT plan of care cert/re-cert       G-Codes - 05/June 04, 201730    Functional Assessment Tool Used 5xsit to stand/ 95m34mk   Functional Limitation Mobility: Walking and moving around   Mobility: Walking and Moving Around Current Status ([phone] least 20 percent but less than 40 percent impaired, limited or restricted   Mobility: Walking and Moving Around Goal Status ([id number] least 40 percent but less than 60 percent impaired, limited or restricted      Problem List Patient Active Problem List   Diagnosis Date Noted  . Depression 01/12/2015  . Shoulder pain 12/17/2014  . Pain in shoulder 08/05/2014  . Gonalgia 07/21/2014  . History of knee surgery 07/21/2014  . Allergic rhinitis 07/16/2014  . Anxiety 07/16/2014  . Benign hypertension 07/16/2014  . Alteration in bowel elimination: incontinence 07/16/2014  .  Chronic kidney disease (CKD), stage III (moderate) 07/16/2014  . Colitis 07/16/2014  . CN (constipation) 07/16/2014  . D (diarrhea) 07/16/2014  . Contracture of palmar fascia (Dupuytren's) 07/16/2014  . Hypercholesteremia 07/16/2014  . Hypoxia 07/16/2014  . Absence of bladder continence 07/16/2014  . Focal lymphocytic colitis 07/16/2014  . Extreme obesity (HCC)[place]/04/2014  . Arthritis, degenerative 07/16/2014  . Disorder of peripheral nervous system (HCC)[place]/04/2014  . Neuralgia neuritis, sciatic nerve 07/16/2014  . Hyperparathyroidism, secondary (  [place]) 07/16/2014  . Apnea, sleep 07/16/2014  . Subclinical hypothyroidism 07/16/2014  . Diabetes mellitus, type 2 ([place]) 07/16/2014  . 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 Urinary incontinence without sensory awareness 09/03/2012  . Palpitations 12/28/2010  . SOB (shortness of breath) 12/28/2010  . Overweight 08/02/2009    Deborah [name] 06/21/2015, 4:32 PM  [place] MAIN [facility] SERVICES [address] [place], [place], [address] Phone: [phone]   Fax:  [phone]  Name: Deborah [name] MRN: [id number] Date of Birth: [birth year]

## 2015-06-23 ENCOUNTER — Ambulatory Visit: Payer: Medicare Other

## 2015-06-23 DIAGNOSIS — M6281 Muscle weakness (generalized): Secondary | ICD-10-CM

## 2015-06-23 DIAGNOSIS — R2681 Unsteadiness on feet: Secondary | ICD-10-CM

## 2015-06-23 NOTE — Therapy (Signed)
[place] MAIN [facility] SERVICES [place], [place], [address] Phone: [phone]   Fax:  [phone]  Physical Therapy Treatment  Patient Details  Name: Deborah [name] MRN: [id number] Date of Birth: 05/04/1930 No Data Recorded  Encounter Date: 06/23/2015      PT End of Session - 06/23/15 1707    Visit Number 9   Number of Visits 25   Date for PT Re-Evaluation 07/19/15   Authorization Type 2/10   PT Start Time 1520   PT Stop Time 1600   PT Time Calculation (min) 40 min   Equipment Utilized During Treatment Gait belt  RW   Activity Tolerance Patient tolerated treatment well   Behavior During Therapy [facility] for tasks assessed/performed      Past Medical History  Diagnosis Date  . Hyperlipidemia   . Diabetes mellitus   . Palpitations   . Hypertension   . Osteoarthritis   . Contracture of palmar fascia   . Cough   . Anxiety   . Otitis externa   . Unspecified adjustment reaction   . Unspecified urinary incontinence   . Secondary hyperparathyroidism (of renal origin)   . Chronic kidney disease, stage III (moderate)   . Pure hypercholesterolemia   . Other specified acquired hypothyroidism   . Unspecified hereditary and idiopathic peripheral neuropathy   . Peripheral neuropathy ([place])   . Obesity   . Sleep apnea     Past Surgical History  Procedure Laterality Date  . Replacement total knee Bilateral   . Hand surgery Right   . Back surgery  1990    Ruptured Disc.  Marland Kitchen Shoulder surgery Right 06/2007  . Colonoscopy    . Cardiac catheterization  03/2008    There were no vitals filed for this visit.      Subjective Assessment - 06/23/15 1706    Subjective pt reports she hasnt been doing her HEP very consistantly    Pertinent History DM, morbid obesity, recent achilles tendonitis, HTN   How long can you stand comfortably? 1 min   How long can you walk comfortably? not far   Diagnostic tests CT scan of head   Patient  Stated Goals be able to walk without the walker   Currently in Pain? No/denies      Therex: Ambulation with RW 2x361f with mod cues for speed and encouragement to keep going 5lb ankle weights 2x10 LAQ 5lb ankle weights toe taps onto 4in step with UE support 2x20 Sit to stand from mat table 4x5 Standing balance on AIREX 30s x 5  pt requires CGA for safety on balance exercises                              PT Education - 06/23/15 1706    Education provided Yes   Education Details begin light housework as tolerated (fold laundry, food prep)    Person(s) Educated Patient;Spouse   Methods Explanation   Comprehension Verbalized understanding             PT Long Term Goals - 06/21/15 1630    PT LONG TERM GOAL #1   Title pt will be able to stand x 3 min without rest to improve ADL ability    Time 4   Period Weeks   Status Achieved   PT LONG TERM GOAL #2   Title pt will be able to walk x 1044fwithout rest using  LRAD to improve home mobility.    Time 8   Period Weeks   Status Achieved   PT LONG TERM GOAL #3   Title pt will be able to perform sit to to stand from 15in surface using LRAD x 5 demonstrating improved LE strength.    Time 8   Period Weeks   Status On-going   PT LONG TERM GOAL #4   Title pt will reduce 5x sit to stand time by 10s from 18in surface using RW demonstrating improved LE strength.    Time 4   Period Weeks   Status On-going   PT LONG TERM GOAL #5   Title pt will improve 69mwalk speed by .214m demonstating improve mobility    Time 4   Period Weeks   Status Achieved               Plan - 06/23/15 1707    Clinical Impression Statement pt was able to ambulate 2x30071fithout rest today with verbal encouragement. progressed balance exercise as well today with compliant surfaces. pt tolerated progressed treatment relatively well. emphasized importance of HEP compliance today.    Rehab Potential Fair   Clinical Impairments  Affecting Rehab Potential co-morbidities    PT Frequency 2x / week   PT Duration 8 weeks   PT Treatment/Interventions Aquatic Therapy;Canalith Repostioning;Cryotherapy;Electrical Stimulation;Moist Heat;Patient/family education;Neuromuscular re-education;Balance training;Therapeutic exercise;Therapeutic activities;Functional mobility training;Stair training;Gait training;Vestibular;Manual techniques      Patient will benefit from skilled therapeutic intervention in order to improve the following deficits and impairments:  Abnormal gait, Decreased activity tolerance, Decreased balance, Decreased endurance, Impaired sensation, Decreased strength, Difficulty walking, Decreased mobility  Visit Diagnosis: Muscle weakness (generalized)  Unsteadiness on feet     Problem List Patient Active Problem List   Diagnosis Date Noted  . Depression 01/12/2015  . Shoulder pain 12/17/2014  . Pain in shoulder 08/05/2014  . Gonalgia 07/21/2014  . History of knee surgery 07/21/2014  . Allergic rhinitis 07/16/2014  . Anxiety 07/16/2014  . Benign hypertension 07/16/2014  . Alteration in bowel elimination: incontinence 07/16/2014  . Chronic kidney disease (CKD), stage III (moderate) 07/16/2014  . Colitis 07/16/2014  . CN (constipation) 07/16/2014  . D (diarrhea) 07/16/2014  . Contracture of palmar fascia (Dupuytren's) 07/16/2014  . Hypercholesteremia 07/16/2014  . Hypoxia 07/16/2014  . Absence of bladder continence 07/16/2014  . Focal lymphocytic colitis 07/16/2014  . Extreme obesity (HCCEast [place]6/04/2014  . Arthritis, degenerative 07/16/2014  . Disorder of peripheral nervous system (HCCTiffin6/04/2014  . Neuralgia neuritis, sciatic nerve 07/16/2014  . Hyperparathyroidism, secondary (HCCNed[place]6/04/2014  . Apnea, sleep 07/16/2014  . Subclinical hypothyroidism 07/16/2014  . Diabetes mellitus, type 2 (HCCStewartsville6/04/2014  . 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 Urinary incontinence without sensory awareness  09/03/2012  . Palpitations 12/28/2010  . SOB (shortness of breath) 12/28/2010  . Overweight 08/02/2009   AshGorden Harmsortorici, PT, DPT #[id number],Sherese Heyward 06/23/2015, 5:08 PM  [place] REHWarm [facility] San AntonioRVICES [address] [place]one: [phone]:  [phone]: BetMINETTA KRISHERN: [phone] of Birth: 11/11-17-1932

## 2015-06-24 ENCOUNTER — Other Ambulatory Visit: Payer: Self-pay | Admitting: Family Medicine

## 2015-06-24 DIAGNOSIS — I10 Essential (primary) hypertension: Secondary | ICD-10-CM

## 2015-06-28 ENCOUNTER — Ambulatory Visit: Payer: Medicare Other

## 2015-06-28 DIAGNOSIS — M6281 Muscle weakness (generalized): Secondary | ICD-10-CM

## 2015-06-28 DIAGNOSIS — R2681 Unsteadiness on feet: Secondary | ICD-10-CM

## 2015-06-28 NOTE — Therapy (Signed)
[place] MAIN [facility] SERVICES [address] [place], [place], [address] Phone: [phone]   Fax:  [phone]  Physical Therapy Treatment  Patient Details  Name: Deborah [name] MRN: [id number] Date of Birth: 1930-12-29 No Data Recorded  Encounter Date: 06/28/2015      PT End of Session - 06/28/15 1622    Visit Number 10   Number of Visits 25   Date for PT Re-Evaluation 07/19/15   Authorization Type 3/10   PT Start Time 1515   PT Stop Time 1600   PT Time Calculation (min) 45 min   Equipment Utilized During Treatment Gait belt  RW   Activity Tolerance Patient tolerated treatment well   Behavior During Therapy [facility] for tasks assessed/performed      Past Medical History  Diagnosis Date  . Hyperlipidemia   . Diabetes mellitus   . Palpitations   . Hypertension   . Osteoarthritis   . Contracture of palmar fascia   . Cough   . Anxiety   . Otitis externa   . Unspecified adjustment reaction   . Unspecified urinary incontinence   . Secondary hyperparathyroidism (of renal origin)   . Chronic kidney disease, stage III (moderate)   . Pure hypercholesterolemia   . Other specified acquired hypothyroidism   . Unspecified hereditary and idiopathic peripheral neuropathy   . Peripheral neuropathy ([place])   . Obesity   . Sleep apnea     Past Surgical History  Procedure Laterality Date  . Replacement total knee Bilateral   . Hand surgery Right   . Back surgery  1990    Ruptured Disc.  Marland Kitchen Shoulder surgery Right 06/2007  . Colonoscopy    . Cardiac catheterization  03/2008    There were no vitals filed for this visit.      Subjective Assessment - 06/28/15 1621    Subjective pt reports she can lift her legs better in to their van    Pertinent History DM, morbid obesity, recent achilles tendonitis, HTN   How long can you stand comfortably? 1 min   How long can you walk comfortably? not far   Diagnostic tests CT scan of head   Patient  Stated Goals be able to walk without the walker   Currently in Pain? No/denies      Gait training: In hallway 4x 85 ft with pt attending to playing cards on the wall (red vs. Black). Pt used RW, cues for attending to environment, cues for foot clearance, cues for safety with turns using RW. CGA for safety  Pt needs intermittent rest breaks.    Therex: Sit to stand from gray mat table at lower position 1x5, then 2x3 without UE, CGA for safety standing hip abduction x 10 each side Seated ankle DF with green band x 10 Standing squat holding // bars x 10, cues for hip hinge, CGA for safety Fwd/ retro walking in // bars with 1 UE support x 6 laps  pt requires CGA for safety on balance exercises   Pt requires min-mod verbal and tactile cues for proper exercise performance                           PT Education - 06/28/15 1621    Education provided Yes   Education Details safety with RW during turns, correction of standing squat during exercise, to emphasize hip hinge    Person(s) Educated Patient;Spouse   Methods Explanation  Comprehension Verbalized understanding             PT Long Term Goals - 06/21/15 1630    PT LONG TERM GOAL #1   Title pt will be able to stand x 3 min without rest to improve ADL ability    Time 4   Period Weeks   Status Achieved   PT LONG TERM GOAL #2   Title pt will be able to walk x 138f without rest using LRAD to improve home mobility.    Time 8   Period Weeks   Status Achieved   PT LONG TERM GOAL #3   Title pt will be able to perform sit to to stand from 15in surface using LRAD x 5 demonstrating improved LE strength.    Time 8   Period Weeks   Status On-going   PT LONG TERM GOAL #4   Title pt will reduce 5x sit to stand time by 10s from 18in surface using RW demonstrating improved LE strength.    Time 4   Period Weeks   Status On-going   PT LONG TERM GOAL #5   Title pt will improve 144malk speed by .35m50mdemonstating  improve mobility    Time 4   Period Weeks   Status Achieved               Plan - 06/28/15 1622    Clinical Impression Statement pt did pretty well with progression of gait training in hallway today to initiate multitasking to improve her safety ambulating in her home. she improved with repetative practice and cues to attend to her envoirnment while walking and doing the task at hand. progressed LE strengthening with sit to stand from mat table lowest position.    Rehab Potential Fair   Clinical Impairments Affecting Rehab Potential co-morbidities    PT Frequency 2x / week   PT Duration 8 weeks   PT Treatment/Interventions Aquatic Therapy;Canalith Repostioning;Cryotherapy;Electrical Stimulation;Moist Heat;Patient/family education;Neuromuscular re-education;Balance training;Therapeutic exercise;Therapeutic activities;Functional mobility training;Stair training;Gait training;Vestibular;Manual techniques      Patient will benefit from skilled therapeutic intervention in order to improve the following deficits and impairments:  Abnormal gait, Decreased activity tolerance, Decreased balance, Decreased endurance, Impaired sensation, Decreased strength, Difficulty walking, Decreased mobility  Visit Diagnosis: Muscle weakness (generalized)  Unsteadiness on feet     Problem List Patient Active Problem List   Diagnosis Date Noted  . Depression 01/12/2015  . Shoulder pain 12/17/2014  . Pain in shoulder 08/05/2014  . Gonalgia 07/21/2014  . History of knee surgery 07/21/2014  . Allergic rhinitis 07/16/2014  . Anxiety 07/16/2014  . Benign hypertension 07/16/2014  . Alteration in bowel elimination: incontinence 07/16/2014  . Chronic kidney disease (CKD), stage III (moderate) 07/16/2014  . Colitis 07/16/2014  . CN (constipation) 07/16/2014  . D (diarrhea) 07/16/2014  . Contracture of palmar fascia (Dupuytren's) 07/16/2014  . Hypercholesteremia 07/16/2014  . Hypoxia 07/16/2014  .  Absence of bladder continence 07/16/2014  . Focal lymphocytic colitis 07/16/2014  . Extreme obesity (HCCSouth San Gabriel6/04/2014  . Arthritis, degenerative 07/16/2014  . Disorder of peripheral nervous system (HCCDe Baca6/04/2014  . Neuralgia neuritis, sciatic nerve 07/16/2014  . Hyperparathyroidism, secondary (HCCSt. [place]6/04/2014  . Apnea, sleep 07/16/2014  . Subclinical hypothyroidism 07/16/2014  . Diabetes mellitus, type 2 (HCCWay[place]6/04/2014  . 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 Urinary incontinence without sensory awareness 09/03/2012  . Palpitations 12/28/2010  . SOB (shortness of breath) 12/28/2010  . Overweight 08/02/2009   AshGorden Harmsortorici, PT, DPT #[id number]  Hajer Dwyer 06/28/2015, 4:25 PM  [place] MAIN [facility] SERVICES [address] [place], [place], [address] Phone: [phone]   Fax:  [phone]  Name: Deborah [name] MRN: [id number] Date of Birth: Apr 01, 1930

## 2015-06-30 ENCOUNTER — Ambulatory Visit: Payer: Medicare Other

## 2015-06-30 VITALS — BP 138/55 | HR 75

## 2015-06-30 DIAGNOSIS — M6281 Muscle weakness (generalized): Secondary | ICD-10-CM

## 2015-06-30 DIAGNOSIS — R2681 Unsteadiness on feet: Secondary | ICD-10-CM

## 2015-06-30 NOTE — Therapy (Signed)
[place] MAIN [facility] SERVICES [address] [place], [place], [address] Phone: [phone]   Fax:  [phone]  Physical Therapy Treatment  Patient Details  Name: Deborah [name] MRN: [id number] Date of Birth: 10/28/1930 No Data Recorded  Encounter Date: 06/30/2015      PT End of Session - 06/30/15 1602    Visit Number 11   Number of Visits 25   Date for PT Re-Evaluation 07/19/15   Authorization Type 4/10   PT Start Time 5638   PT Stop Time 1558   PT Time Calculation (min) 42 min   Equipment Utilized During Treatment Gait belt  RW   Activity Tolerance Patient tolerated treatment well   Behavior During Therapy WFL for tasks assessed/performed      Past Medical History  Diagnosis Date  . Hyperlipidemia   . Diabetes mellitus   . Palpitations   . Hypertension   . Osteoarthritis   . Contracture of palmar fascia   . Cough   . Anxiety   . Otitis externa   . Unspecified adjustment reaction   . Unspecified urinary incontinence   . Secondary hyperparathyroidism (of renal origin)   . Chronic kidney disease, stage III (moderate)   . Pure hypercholesterolemia   . Other specified acquired hypothyroidism   . Unspecified hereditary and idiopathic peripheral neuropathy   . Peripheral neuropathy ([place])   . Obesity   . Sleep apnea     Past Surgical History  Procedure Laterality Date  . Replacement total knee Bilateral   . Hand surgery Right   . Back surgery  1990    Ruptured Disc.  Marland Kitchen Shoulder surgery Right 06/2007  . Colonoscopy    . Cardiac catheterization  03/2008    Filed Vitals:   06/30/15 1520  BP: 138/55  Pulse: 75  SpO2: 95%        Subjective Assessment - 06/30/15 1520    Subjective Pt states she is doing well on this date. No reported pain currently. States that she does her HEP "sometimes." Overall believes that she has been improving with physical therapy. No specific questions or concerns on this date.    Pertinent  History DM, morbid obesity, recent achilles tendonitis, HTN   How long can you stand comfortably? 1 min   How long can you walk comfortably? not far   Diagnostic tests CT scan of head   Patient Stated Goals be able to walk without the walker   Currently in Pain? No/denies      TREATMENT  Therex: Sit to stand from gray mat table x 5 without UE support, then x 5 at lowest position without UE support, CGA for safety Standing marches without UE support x 10 each; Standing mini squats with UE support for balance only x 10; Standing heel raises x 10; Standing hip abduction x 10 each side; Fwd/ retro walking in // bars without support x 4 lengths each; Side stepping in // bars without UE support x 4 lengths with each side; Pt requires CGA for safety on balance exercises    Pt requires min-mod verbal and tactile cues for proper exercise performance      Gait training: In hallway 2 x 100 ft with pt performing horizontal and vertical head turns on command. Pt used RW, cues for attending to environment, cues for foot clearance, cues for safety with turns using RW, cues to stay within confines of walker and to increase step length. CGA for safety. Pt requires  one seated rest break between bouts.                           PT Education - 06/30/15 1601    Education provided Yes   Education Details HEP reinforced, correction for squat technique,   Person(s) Educated Patient;Spouse   Methods Explanation;Demonstration;Tactile cues;Verbal cues   Comprehension Verbalized understanding;Verbal cues required;Tactile cues required  difficulty understanding hip hinge             PT Long Term Goals - 06/21/15 1630    PT LONG TERM GOAL #1   Title pt will be able to stand x 3 min without rest to improve ADL ability    Time 4   Period Weeks   Status Achieved   PT LONG TERM GOAL #2   Title pt will be able to walk x 141f without rest using LRAD to improve home mobility.     Time 8   Period Weeks   Status Achieved   PT LONG TERM GOAL #3   Title pt will be able to perform sit to to stand from 15in surface using LRAD x 5 demonstrating improved LE strength.    Time 8   Period Weeks   Status On-going   PT LONG TERM GOAL #4   Title pt will reduce 5x sit to stand time by 10s from 18in surface using RW demonstrating improved LE strength.    Time 4   Period Weeks   Status On-going   PT LONG TERM GOAL #5   Title pt will improve 1831malk speed by .31m84mdemonstating improve mobility    Time 4   Period Weeks   Status Achieved               Plan - 06/30/15 1603    Clinical Impression Statement Pt demonstrates good progress with therapy on this date. She is able to increase her ambulation distance in the hallway as well as ambulate in parallel bars without UE support. She does appear to have some difficulty understanding and/or following instructions. Pt encouraged to continue HEP and follow-up as scheduled.    Rehab Potential Fair   Clinical Impairments Affecting Rehab Potential co-morbidities    PT Frequency 2x / week   PT Duration 8 weeks   PT Treatment/Interventions Aquatic Therapy;Canalith Repostioning;Cryotherapy;Electrical Stimulation;Moist Heat;Patient/family education;Neuromuscular re-education;Balance training;Therapeutic exercise;Therapeutic activities;Functional mobility training;Stair training;Gait training;Vestibular;Manual techniques   PT Next Visit Plan Continue LE strengthening and gait training   PT Home Exercise Plan As prescribed   Consulted and Agree with Plan of Care Patient      Patient will benefit from skilled therapeutic intervention in order to improve the following deficits and impairments:  Abnormal gait, Decreased activity tolerance, Decreased balance, Decreased endurance, Impaired sensation, Decreased strength, Difficulty walking, Decreased mobility  Visit Diagnosis: Muscle weakness (generalized)  Unsteadiness on  feet     Problem List Patient Active Problem List   Diagnosis Date Noted  . Depression 01/12/2015  . Shoulder pain 12/17/2014  . Pain in shoulder 08/05/2014  . Gonalgia 07/21/2014  . History of knee surgery 07/21/2014  . Allergic rhinitis 07/16/2014  . Anxiety 07/16/2014  . Benign hypertension 07/16/2014  . Alteration in bowel elimination: incontinence 07/16/2014  . Chronic kidney disease (CKD), stage III (moderate) 07/16/2014  . Colitis 07/16/2014  . CN (constipation) 07/16/2014  . D (diarrhea) 07/16/2014  . Contracture of palmar fascia (Dupuytren's) 07/16/2014  . Hypercholesteremia 07/16/2014  . Hypoxia 07/16/2014  .  Absence of bladder continence 07/16/2014  . Focal lymphocytic colitis 07/16/2014  . Extreme obesity ([place]) 07/16/2014  . Arthritis, degenerative 07/16/2014  . Disorder of peripheral nervous system ([place]) 07/16/2014  . Neuralgia neuritis, sciatic nerve 07/16/2014  . Hyperparathyroidism, secondary ([place]) 07/16/2014  . Apnea, sleep 07/16/2014  . Subclinical hypothyroidism 07/16/2014  . Diabetes mellitus, type 2 ([place]) 07/16/2014  . 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 Urinary incontinence without sensory awareness 09/03/2012  . Palpitations 12/28/2010  . SOB (shortness of breath) 12/28/2010  . Overweight 08/02/2009   Phillips Grout PT, DPT   Alcario Tinkey 06/30/2015, 4:06 PM  [place] MAIN [facility] SERVICES [address] [place], [place], [address] Phone: [phone]   Fax:  [phone]  Name: SHANEKA EFAW MRN: [id number] Date of Birth: [birth year]

## 2015-07-05 ENCOUNTER — Ambulatory Visit: Payer: Medicare Other

## 2015-07-05 ENCOUNTER — Other Ambulatory Visit: Payer: Self-pay

## 2015-07-05 MED ORDER — BUDESONIDE 3 MG PO CPEP: 6.0000 mg | ORAL_CAPSULE | Freq: Every day | ORAL | Status: DC

## 2015-07-07 ENCOUNTER — Ambulatory Visit: Payer: Medicare Other

## 2015-07-07 DIAGNOSIS — R2681 Unsteadiness on feet: Secondary | ICD-10-CM

## 2015-07-07 DIAGNOSIS — M6281 Muscle weakness (generalized): Secondary | ICD-10-CM

## 2015-07-07 NOTE — Therapy (Signed)
[place] MAIN [facility] SERVICES [address] [place], [place], [address] Phone: [phone]   Fax:  [phone]  Physical Therapy Treatment  Patient Details  Name: Deborah [name] MRN: [id number] Date of Birth: 03-14-1930 No Data Recorded  Encounter Date: 07/07/2015      PT End of Session - 07/07/15 1601    Visit Number 12   Number of Visits 25   Date for PT Re-Evaluation 07/19/15   Authorization Type 5/10   PT Start Time 1515   PT Stop Time 1600   PT Time Calculation (min) 45 min   Equipment Utilized During Treatment Gait belt  RW   Activity Tolerance Patient tolerated treatment well   Behavior During Therapy [facility] for tasks assessed/performed      Past Medical History  Diagnosis Date  . Hyperlipidemia   . Diabetes mellitus   . Palpitations   . Hypertension   . Osteoarthritis   . Contracture of palmar fascia   . Cough   . Anxiety   . Otitis externa   . Unspecified adjustment reaction   . Unspecified urinary incontinence   . Secondary hyperparathyroidism (of renal origin)   . Chronic kidney disease, stage III (moderate)   . Pure hypercholesterolemia   . Other specified acquired hypothyroidism   . Unspecified hereditary and idiopathic peripheral neuropathy   . Peripheral neuropathy ([place])   . Obesity   . Sleep apnea     Past Surgical History  Procedure Laterality Date  . Replacement total knee Bilateral   . Hand surgery Right   . Back surgery  1990    Ruptured Disc.  Marland Kitchen Shoulder surgery Right 06/2007  . Colonoscopy    . Cardiac catheterization  03/2008    There were no vitals filed for this visit.      Subjective Assessment - 07/07/15 1559    Subjective pt reports she was ill over the weekend and hasnt been doing much   Pertinent History DM, morbid obesity, recent achilles tendonitis, HTN   How long can you stand comfortably? 1 min   How long can you walk comfortably? not far   Diagnostic tests CT scan of head   Patient Stated Goals be able to walk without the walker   Currently in Pain? No/denies        Gait training: In hallway 63f x 6 laps  Finding playing cards on wall cues for increased gait speed, cues for safety with turns  NMR:  Sit to stand no Ue from mat table at lowest level 3 x 5 Fwd/ side/ retro single step weight shift  X 10 each, performed bilaterally, no UE using RLE, single UE using LLE Standing on AIREX, wide BOS 1 min x 3, then progressed to SS/ up/down head turns 2 x 10 each with cues for safety, cues for postural sway correction CGA for safety  pt requires CGA for safety on balance exercises                            PT Education - 07/07/15 1601    Education provided Yes   Education Details slow and control exercises, use RW at all times   Person(s) Educated Patient   Methods Explanation   Comprehension Verbalized understanding             PT Long Term Goals - 06/21/15 1630    PT LONG TERM GOAL #1   Title pt  will be able to stand x 3 min without rest to improve ADL ability    Time 4   Period Weeks   Status Achieved   PT LONG TERM GOAL #2   Title pt will be able to walk x 148f without rest using LRAD to improve home mobility.    Time 8   Period Weeks   Status Achieved   PT LONG TERM GOAL #3   Title pt will be able to perform sit to to stand from 15in surface using LRAD x 5 demonstrating improved LE strength.    Time 8   Period Weeks   Status On-going   PT LONG TERM GOAL #4   Title pt will reduce 5x sit to stand time by 10s from 18in surface using RW demonstrating improved LE strength.    Time 4   Period Weeks   Status On-going   PT LONG TERM GOAL #5   Title pt will improve 163malk speed by .81m44mdemonstating improve mobility    Time 4   Period Weeks   Status Achieved               Plan - 07/07/15 1601    Clinical Impression Statement pt did fair with progression of balance exercises today. she has more trouble  bearing weight through the RLE vs the LLE. pt has delayed reaction to postural sway even with cues has difficulty to correct.    Rehab Potential Fair   Clinical Impairments Affecting Rehab Potential co-morbidities    PT Frequency 2x / week   PT Duration 8 weeks   PT Treatment/Interventions Aquatic Therapy;Canalith Repostioning;Cryotherapy;Electrical Stimulation;Moist Heat;Patient/family education;Neuromuscular re-education;Balance training;Therapeutic exercise;Therapeutic activities;Functional mobility training;Stair training;Gait training;Vestibular;Manual techniques   PT Next Visit Plan Continue LE strengthening and gait training   PT Home Exercise Plan As prescribed   Consulted and Agree with Plan of Care Patient      Patient will benefit from skilled therapeutic intervention in order to improve the following deficits and impairments:  Abnormal gait, Decreased activity tolerance, Decreased balance, Decreased endurance, Impaired sensation, Decreased strength, Difficulty walking, Decreased mobility  Visit Diagnosis: Muscle weakness (generalized)  Unsteadiness on feet     Problem List Patient Active Problem List   Diagnosis Date Noted  . Depression 01/12/2015  . Shoulder pain 12/17/2014  . Pain in shoulder 08/05/2014  . Gonalgia 07/21/2014  . History of knee surgery 07/21/2014  . Allergic rhinitis 07/16/2014  . Anxiety 07/16/2014  . Benign hypertension 07/16/2014  . Alteration in bowel elimination: incontinence 07/16/2014  . Chronic kidney disease (CKD), stage III (moderate) 07/16/2014  . Colitis 07/16/2014  . CN (constipation) 07/16/2014  . D (diarrhea) 07/16/2014  . Contracture of palmar fascia (Dupuytren's) 07/16/2014  . Hypercholesteremia 07/16/2014  . Hypoxia 07/16/2014  . Absence of bladder continence 07/16/2014  . Focal lymphocytic colitis 07/16/2014  . Extreme obesity ([place]6/04/2014  . Arthritis, degenerative 07/16/2014  . Disorder of peripheral nervous system  ([place]CC[place]6/04/2014  . Neuralgia neuritis, sciatic nerve 07/16/2014  . Hyperparathyroidism, secondary (HCC[place]ochsl[place]6/04/2014  . Apnea, sleep 07/16/2014  . Subclinical hypothyroidism 07/16/2014  . Diabetes mellitus, type 2 (HCC[place]enc[place]6/04/2014  . 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 Urinary incontinence without sensory awareness 09/03/2012  . Palpitations 12/28/2010  . SOB (shortness of breath) 12/28/2010  . Overweight 08/02/2009   AshGorden Harmsortorici, PT, DPT #[id number],Hadyn Blanck 07/07/2015, 4:06 PM  [facility] [address] [place]: [phone]  Fax:  [phone]  Name: Deborah [name] MRN: [id number] Date of Birth: May 14, 1930

## 2015-07-12 ENCOUNTER — Ambulatory Visit: Payer: Medicare Other

## 2015-07-12 DIAGNOSIS — M6281 Muscle weakness (generalized): Secondary | ICD-10-CM

## 2015-07-12 DIAGNOSIS — R2681 Unsteadiness on feet: Secondary | ICD-10-CM

## 2015-07-12 NOTE — Therapy (Signed)
[place] MAIN [facility] SERVICES [address] [place], [place], [address] Phone: [phone]   Fax:  [phone]  Physical Therapy Treatment  Patient Details  Name: Deborah [name] MRN: [id number] Date of Birth: [birth year] No Data Recorded  Encounter Date: 07/12/2015      PT End of Session - 07/12/15 1611    Visit Number 13   Number of Visits 25   Date for PT Re-Evaluation 07/19/15   Authorization Type 6/10   PT Start Time 1520   PT Stop Time 1559   PT Time Calculation (min) 39 min   Equipment Utilized During Treatment Gait belt  RW   Activity Tolerance Patient tolerated treatment well   Behavior During Therapy WFL for tasks assessed/performed      Past Medical History  Diagnosis Date  . Hyperlipidemia   . Diabetes mellitus   . Palpitations   . Hypertension   . Osteoarthritis   . Contracture of palmar fascia   . Cough   . Anxiety   . Otitis externa   . Unspecified adjustment reaction   . Unspecified urinary incontinence   . Secondary hyperparathyroidism (of renal origin)   . Chronic kidney disease, stage III (moderate)   . Pure hypercholesterolemia   . Other specified acquired hypothyroidism   . Unspecified hereditary and idiopathic peripheral neuropathy   . Peripheral neuropathy ([place])   . Obesity   . Sleep apnea     Past Surgical History  Procedure Laterality Date  . Replacement total knee Bilateral   . Hand surgery Right   . Back surgery  1990    Ruptured Disc.  Marland Kitchen Shoulder surgery Right 06/2007  . Colonoscopy    . Cardiac catheterization  03/2008    There were no vitals filed for this visit.      Subjective Assessment - 07/12/15 1610    Subjective pt reports she may want to discontinue therapy due to a high co-pay.    Pertinent History DM, morbid obesity, recent achilles tendonitis, HTN   How long can you stand comfortably? 1 min   How long can you walk comfortably? not far   Diagnostic tests CT scan of head    Patient Stated Goals be able to walk without the walker   Currently in Pain? Yes   Pain Score 3    Pain Location Back   Pain Orientation Lower   Pain Descriptors / Indicators Aching   Pain Type Chronic pain        NMR: Nustep L 3 , LE only - cues to keep SPM >50 for LE muscular endurance Standing on AIREX wide BOS no UE 30s x 7 Fwd step and back (no UE with RLE, 1 UE with LLE) 2 x 10 each Side step and back (no UE with RLE, 1 UE with LLE) 2 x 10 each Toe taps on box 2 x 10 each with 1 UE  pt requires CGA-min A for safety on balance exercises    pt needs mod-max verbal and visual cues for proper exercise performance.                        PT Education - 07/12/15 1610    Education provided Yes   Education Details HEP compliance    Person(s) Educated Patient   Methods Explanation   Comprehension Verbalized understanding             PT Long Term Goals - 06/21/15 1630  PT LONG TERM GOAL #1   Title pt will be able to stand x 3 min without rest to improve ADL ability    Time 4   Period Weeks   Status Achieved   PT LONG TERM GOAL #2   Title pt will be able to walk x 136f without rest using LRAD to improve home mobility.    Time 8   Period Weeks   Status Achieved   PT LONG TERM GOAL #3   Title pt will be able to perform sit to to stand from 15in surface using LRAD x 5 demonstrating improved LE strength.    Time 8   Period Weeks   Status On-going   PT LONG TERM GOAL #4   Title pt will reduce 5x sit to stand time by 10s from 18in surface using RW demonstrating improved LE strength.    Time 4   Period Weeks   Status On-going   PT LONG TERM GOAL #5   Title pt will improve 158malk speed by .70m70mdemonstating improve mobility    Time 4   Period Weeks   Status Achieved               Plan - 07/12/15 1611    Clinical Impression Statement pt is able to tolerate more/longer standing activity before needing a rest break. pt overall shows  more stability in standing, but still struggles with her balance. pt also struggles at times with self awareness/ body awareness which at times leads safety issues.  pt has not been compliant to HEP, which again was emphasized today. plan for progress note next visit.    Rehab Potential Fair   Clinical Impairments Affecting Rehab Potential co-morbidities    PT Frequency 2x / week   PT Duration 8 weeks   PT Treatment/Interventions Aquatic Therapy;Canalith Repostioning;Cryotherapy;Electrical Stimulation;Moist Heat;Patient/family education;Neuromuscular re-education;Balance training;Therapeutic exercise;Therapeutic activities;Functional mobility training;Stair training;Gait training;Vestibular;Manual techniques   PT Next Visit Plan Continue LE strengthening and gait training   PT Home Exercise Plan As prescribed   Consulted and Agree with Plan of Care Patient      Patient will benefit from skilled therapeutic intervention in order to improve the following deficits and impairments:  Abnormal gait, Decreased activity tolerance, Decreased balance, Decreased endurance, Impaired sensation, Decreased strength, Difficulty walking, Decreased mobility  Visit Diagnosis: Muscle weakness (generalized)  Unsteadiness on feet     Problem List Patient Active Problem List   Diagnosis Date Noted  . Depression 01/12/2015  . Shoulder pain 12/17/2014  . Pain in shoulder 08/05/2014  . Gonalgia 07/21/2014  . History of knee surgery 07/21/2014  . Allergic rhinitis 07/16/2014  . Anxiety 07/16/2014  . Benign hypertension 07/16/2014  . Alteration in bowel elimination: incontinence 07/16/2014  . Chronic kidney disease (CKD), stage III (moderate) 07/16/2014  . Colitis 07/16/2014  . CN (constipation) 07/16/2014  . D (diarrhea) 07/16/2014  . Contracture of palmar fascia (Dupuytren's) 07/16/2014  . Hypercholesteremia 07/16/2014  . Hypoxia 07/16/2014  . Absence of bladder continence 07/16/2014  . Focal  lymphocytic colitis 07/16/2014  . Extreme obesity ([place]CCForest [place]6/04/2014  . Arthritis, degenerative 07/16/2014  . Disorder of peripheral nervous system (HCCMa[place]6/04/2014  . Neuralgia neuritis, sciatic nerve 07/16/2014  . Hyperparathyroidism, secondary (HCCIr[place]6/04/2014  . Apnea, sleep 07/16/2014  . Subclinical hypothyroidism 07/16/2014  . Diabetes mellitus, type 2 (HCCFranklin Springs6/04/2014  . 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 Urinary incontinence without sensory awareness 09/03/2012  . Palpitations 12/28/2010  . SOB (shortness of breath) 12/28/2010  .  Overweight 08/02/2009   Gorden Harms. [place] Crance, PT, DPT [phone]  Zarif Rathje 07/12/2015, 4:15 PM  [place] [facility] MAIN [facility] SERVICES [address] [place], [place], [address] Phone: [phone]   Fax:  [phone]  Name: Deborah [name] MRN: [id number] Date of Birth: 11-14-1930

## 2015-07-14 ENCOUNTER — Ambulatory Visit: Payer: Medicare Other | Attending: Orthopedic Surgery

## 2015-07-14 DIAGNOSIS — R2681 Unsteadiness on feet: Secondary | ICD-10-CM | POA: Diagnosis present

## 2015-07-14 DIAGNOSIS — M6281 Muscle weakness (generalized): Secondary | ICD-10-CM | POA: Insufficient documentation

## 2015-07-14 NOTE — Therapy (Signed)
[place] MAIN [facility] SERVICES [address] [place], [place], [address] Phone: [phone]   Fax:  [phone]  Physical Therapy Treatment/ Discharge  Patient Details  Name: Deborah [name] MRN: [id number] Date of Birth: 08-14-1930 No Data Recorded  Encounter Date: 07/14/2015      PT End of Session - 07/14/15 1553    Visit Number 14   Number of Visits 25   Date for PT Re-Evaluation 07/19/15   Authorization Type 7/10   PT Start Time 1515   PT Stop Time 1545   PT Time Calculation (min) 30 min   Equipment Utilized During Treatment Gait belt  RW   Activity Tolerance Patient tolerated treatment well   Behavior During Therapy WFL for tasks assessed/performed      Past Medical History  Diagnosis Date  . Hyperlipidemia   . Diabetes mellitus   . Palpitations   . Hypertension   . Osteoarthritis   . Contracture of palmar fascia   . Cough   . Anxiety   . Otitis externa   . Unspecified adjustment reaction   . Unspecified urinary incontinence   . Secondary hyperparathyroidism (of renal origin)   . Chronic kidney disease, stage III (moderate)   . Pure hypercholesterolemia   . Other specified acquired hypothyroidism   . Unspecified hereditary and idiopathic peripheral neuropathy   . Peripheral neuropathy ([place])   . Obesity   . Sleep apnea     Past Surgical History  Procedure Laterality Date  . Replacement total knee Bilateral   . Hand surgery Right   . Back surgery  1990    Ruptured Disc.  Marland Kitchen Shoulder surgery Right 06/2007  . Colonoscopy    . Cardiac catheterization  03/2008    There were no vitals filed for this visit.      Subjective Assessment - 07/14/15 1552    Subjective pt reports she does not do her HEP   Pertinent History DM, morbid obesity, recent achilles tendonitis, HTN   How long can you stand comfortably? 1 min   How long can you walk comfortably? not far   Diagnostic tests CT scan of head   Patient Stated Goals  be able to walk without the walker   Currently in Pain? No/denies       Therex: Pt reassessed outcome measures and progress towards goals      [facility] PT Assessment - 07/14/15 0001    Standardized Balance Assessment   Five times sit to stand comments  19s   10 Meter Walk 0.75ms     pt able to stand from 17in surface with UE support and RW. Pt has difficulty understanding hand placement despite extensive education Discussed at length,with pt and spouse importance of HEP compliance to progress her LE strength and to maintain her gains in mobility following DC.                         PT Education - 07/14/15 1552    Education provided Yes   Education Details progress since starting PT, progress in last 3 weeks   Person(s) Educated Patient;Spouse   Methods Explanation   Comprehension Verbalized understanding             PT Long Term Goals - 07/14/15 1554    PT LONG TERM GOAL #1   Title pt will be able to stand x 3 min without rest to improve ADL ability    Time 4  Period Weeks   Status Achieved   PT LONG TERM GOAL #2   Title pt will be able to walk x 12f without rest using LRAD to improve home mobility.    Time 8   Period Weeks   Status Achieved   PT LONG TERM GOAL #3   Title pt will be able to perform sit to to stand from 15in surface using LRAD x 5 demonstrating improved LE strength.    Baseline 17in surface achieved    Time 8   Period Weeks   Status Not Met   PT LONG TERM GOAL #4   Title pt will reduce 5x sit to stand time by 10s from 18in surface using RW demonstrating improved LE strength.    Time 4   Period Weeks   Status Not Met   PT LONG TERM GOAL #5   Title pt will improve 135malk speed by .41m65mdemonstating improve mobility    Time 4   Period Weeks   Status Achieved               Plan - 07/2015-07-1651    Clinical Impression Statement pt has made good progres especially regarding her endurance and balance with RW since  starting PT, however the past 3 weeks she has made minimial progress, likely due to non-compliance with HEP which she and her spouse have been educated on multiple times. pt will be DC from HEP at this time due to plateauing progress and HEP non-compliance  pt was urged to do her HEP to prevent regression in mobility and call with any questions or concerns.    Rehab Potential Fair   Clinical Impairments Affecting Rehab Potential co-morbidities    PT Frequency 2x / week   PT Duration 8 weeks   PT Treatment/Interventions Aquatic Therapy;Canalith Repostioning;Cryotherapy;Electrical Stimulation;Moist Heat;Patient/family education;Neuromuscular re-education;Balance training;Therapeutic exercise;Therapeutic activities;Functional mobility training;Stair training;Gait training;Vestibular;Manual techniques   PT Next Visit Plan Continue LE strengthening and gait training   PT Home Exercise Plan As prescribed   Consulted and Agree with Plan of Care Patient      Patient will benefit from skilled therapeutic intervention in order to improve the following deficits and impairments:  Abnormal gait, Decreased activity tolerance, Decreased balance, Decreased endurance, Impaired sensation, Decreased strength, Difficulty walking, Decreased mobility  Visit Diagnosis: Muscle weakness (generalized)  Unsteadiness on feet       G-Codes - 06/16-Jun-201756    Functional Assessment Tool Used [id number]/[id number]   Functional Limitation Mobility: Walking and moving around   Mobility: Walking and Moving Around Current Status ([phone] least 20 percent but less than 40 percent impaired, limited or restricted   Mobility: Walking and Moving Around Goal Status ([phone] least 20 percent but less than 40 percent impaired, limited or restricted   Mobility: Walking and Moving Around Discharge Status ([phone] least 20 percent but less than 40 percent impaired, limited or restricted      Problem List Patient Active  Problem List   Diagnosis Date Noted  . Depression 01/12/2015  . Shoulder pain 12/17/2014  . Pain in shoulder 08/05/2014  . Gonalgia 07/21/2014  . History of knee surgery 07/21/2014  . Allergic rhinitis 07/16/2014  . Anxiety 07/16/2014  . Benign hypertension 07/16/2014  . Alteration in bowel elimination: incontinence 07/16/2014  . Chronic kidney disease (CKD), stage III (moderate) 07/16/2014  . Colitis 07/16/2014  . CN (constipation) 07/16/2014  . D (diarrhea) 07/16/2014  . Contracture of palmar fascia (Dupuytren's) 07/16/2014  . Hypercholesteremia  07/16/2014  . Hypoxia 07/16/2014  . Absence of bladder continence 07/16/2014  . Focal lymphocytic colitis 07/16/2014  . Extreme obesity ([place]) 07/16/2014  . Arthritis, degenerative 07/16/2014  . Disorder of peripheral nervous system ([place]) 07/16/2014  . Neuralgia neuritis, sciatic nerve 07/16/2014  . Hyperparathyroidism, secondary ([place]) 07/16/2014  . Apnea, sleep 07/16/2014  . Subclinical hypothyroidism 07/16/2014  . Diabetes mellitus, type 2 ([place]) 07/16/2014  . 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 Urinary incontinence without sensory awareness 09/03/2012  . Palpitations 12/28/2010  . SOB (shortness of breath) 12/28/2010  . Overweight 08/02/2009   Gorden Harms. Chassie Pennix, PT, DPT [phone]  Shalik Sanfilippo 07/14/2015, 3:57 PM  [place] MAIN [facility] SERVICES [address] [place], [place], [address] Phone: [phone]   Fax:  [phone]  Name: Deborah [name] MRN: [id number] Date of Birth: 19-Feb-1930

## 2015-07-18 ENCOUNTER — Ambulatory Visit (INDEPENDENT_AMBULATORY_CARE_PROVIDER_SITE_OTHER): Payer: Medicare Other | Admitting: Family Medicine

## 2015-07-18 ENCOUNTER — Encounter: Payer: Self-pay | Admitting: Family Medicine

## 2015-07-18 VITALS — BP 126/64 | HR 80 | Temp 98.0°F | Resp 16

## 2015-07-18 DIAGNOSIS — I10 Essential (primary) hypertension: Secondary | ICD-10-CM

## 2015-07-18 DIAGNOSIS — E119 Type 2 diabetes mellitus without complications: Secondary | ICD-10-CM

## 2015-07-18 DIAGNOSIS — E7800 Pure hypercholesterolemia, unspecified: Secondary | ICD-10-CM

## 2015-07-18 DIAGNOSIS — K52832 Lymphocytic colitis: Secondary | ICD-10-CM | POA: Diagnosis not present

## 2015-07-18 DIAGNOSIS — R413 Other amnesia: Secondary | ICD-10-CM | POA: Insufficient documentation

## 2015-07-18 DIAGNOSIS — K5900 Constipation, unspecified: Secondary | ICD-10-CM | POA: Diagnosis not present

## 2015-07-18 LAB — POCT GLYCOSYLATED HEMOGLOBIN (HGB A1C)
ESTIMATED AVERAGE GLUCOSE: 123
Hemoglobin A1C: 5.9

## 2015-07-18 NOTE — Progress Notes (Signed)
Subjective:    Patient ID: Deborah [name], female    DOB: [birth year], 82 y.o.   MRN: [id number]  HPI  Main concern is memory loss. Does have confusion at time. Family has also noticed.  Does have dementia in the family. Does think it is worsening.  Is getting concerned and would like a work up for this.  Is a little tangential in the office today. Gets off track.    Also wanted to update me on her achilles tendonitis. Was not very pleased. Also having trouble standing.  Does feel that her equilibrium is off.   Has had a history of falling.   Mostly in the bathroom. Is no longer walking   Constipation did improve with Mag Citrate and did not take Miralax regularly.  Does not feel constipation.  Does have runny BMs after she urinates again.   Had a colonoscopy not long ago.  Does have history of colitis.   Is on Entocort daily. Not helping.  Does not want to go back to GI at this time. Does think Mag Citrate worked initially. Is unclear if having a flare of her colitis.    Blood sugar checked today. Does walk with a walker, but not as active as she used to be.    Patient Active Problem List   Diagnosis Date Noted  . Memory loss 07/18/2015  . Depression 01/12/2015  . Shoulder pain 12/17/2014  . Pain in shoulder 08/05/2014  . Gonalgia 07/21/2014  . History of knee surgery 07/21/2014  . Allergic rhinitis 07/16/2014  . Anxiety 07/16/2014  . Benign hypertension 07/16/2014  . Alteration in bowel elimination: incontinence 07/16/2014  . Chronic kidney disease (CKD), stage III (moderate) 07/16/2014  . Colitis 07/16/2014  . CN (constipation) 07/16/2014  . D (diarrhea) 07/16/2014  . Contracture of palmar fascia (Dupuytren's) 07/16/2014  . Hypercholesteremia 07/16/2014  . Hypoxia 07/16/2014  . Absence of bladder continence 07/16/2014  . Focal lymphocytic colitis 07/16/2014  . Extreme obesity ([place]) 07/16/2014  . Arthritis, degenerative 07/16/2014  . Disorder of peripheral nervous system ([place])  07/16/2014  . Neuralgia neuritis, sciatic nerve 07/16/2014  . Hyperparathyroidism, secondary ([place]) 07/16/2014  . Apnea, sleep 07/16/2014  . Subclinical hypothyroidism 07/16/2014  . Diabetes mellitus, type 2 ([place]) 07/16/2014  . 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 Urinary incontinence without sensory awareness 09/03/2012  . Palpitations 12/28/2010  . SOB (shortness of breath) 12/28/2010  . Overweight 08/02/2009   Past Medical History  Diagnosis Date  . Hyperlipidemia   . Diabetes mellitus   . Palpitations   . Hypertension   . Osteoarthritis   . Contracture of palmar fascia   . Cough   . Anxiety   . Otitis externa   . Unspecified adjustment reaction   . Unspecified urinary incontinence   . Secondary hyperparathyroidism (of renal origin)   . Chronic kidney disease, stage III (moderate)   . Pure hypercholesterolemia   . Other specified acquired hypothyroidism   . Unspecified hereditary and idiopathic peripheral neuropathy   . Peripheral neuropathy ([place])   . Obesity   . Sleep apnea    Current Outpatient Prescriptions on File Prior to Visit  Medication Sig  . acetaminophen (TYLENOL) 500 MG tablet Take 500 mg by mouth every 6 (six) hours as needed.  Marland Kitchen amLODipine (NORVASC) 5 MG tablet TAKE 2 TABLETS BY MOUTH ONCE DAILY  . aspirin 81 MG tablet Take 81 mg by mouth daily.    . budesonide (ENTOCORT EC) 3 MG 24 hr capsule  Take 2 capsules (6 mg total) by mouth daily.  Marland Kitchen buPROPion (WELLBUTRIN SR) 150 MG 12 hr tablet Take 1 tablet (150 mg total) by mouth 2 (two) times daily.  . Cetirizine HCl 10 MG CAPS Take 1 capsule by mouth as needed.   Marland Kitchen escitalopram (LEXAPRO) 20 MG tablet Take 1 tablet (20 mg total) by mouth daily.  Marland Kitchen glipiZIDE (GLUCOTROL) 5 MG tablet TAKE 1 TABLET BY MOUTH ONCE A DAY  . glyBURIDE (DIABETA) 2.5 MG tablet Take 2.5 mg by mouth daily with breakfast.    . hydrochlorothiazide (MICROZIDE) 12.5 MG capsule TAKE 1 CAPSULE BY MOUTH ONCE DAILY  . imipramine (TOFRANIL) 25 MG  tablet Take 25 mg by mouth 2 (two) times daily.   Marland Kitchen LORazepam (ATIVAN) 0.5 MG tablet TAKE 1 TABLET BY MOUTH TWICE A DAY  . polyethylene glycol powder (GLYCOLAX/MIRALAX) powder Take 17 g by mouth daily.  . magnesium citrate SOLN Drink 1/2 bottle then drink last half bottle six hours later. (Patient not taking: Reported on 07/18/2015)   No current facility-administered medications on file prior to visit.   Allergies  Allergen Reactions  . Ace Inhibitors     cough  . Ciprofloxacin Hcl   . Quinolones Other (See Comments)    Tendon rupture   Past Surgical History  Procedure Laterality Date  . Replacement total knee Bilateral   . Hand surgery Right   . Back surgery  1990    Ruptured Disc.  Marland Kitchen Shoulder surgery Right 06/2007  . Colonoscopy    . Cardiac catheterization  03/2008   Social History   Social History  . Marital Status: Married    Spouse Name: N/A  . Number of Children: 2  . Years of Education: N/A   Occupational History  . Retired [employer]    Worked in Proofreader.    Social History Main Topics  . Smoking status: Former Smoker -- 10.00 packs/day for 1 years    Types: Cigarettes    Quit date: 12/27/1997  . Smokeless tobacco: Never Used  . Alcohol Use: No  . Drug Use: No  . Sexual Activity: No   Other Topics Concern  . Not on file   Social History Narrative   Family History  Problem Relation Age of Onset  . Heart attack Father   . Hypertension Sister   . Hypertension Sister   . Alzheimer's disease Mother      .result  Review of Systems  Constitutional: Positive for activity change ("can't stand up" due to achilles tendonitis). Negative for fever, chills, diaphoresis, appetite change and fatigue.  HENT:       Dry Mouth is present  Respiratory: Negative for cough, shortness of breath and wheezing.   Cardiovascular: Positive for leg swelling. Negative for chest pain and palpitations.  Gastrointestinal: Constipation: did get better but recured.     Endocrine: Negative for polydipsia, polyphagia and polyuria.  Neurological:       Memory loss.  Also, unsteady gait.        Objective:   Physical Exam  Constitutional: She is oriented to person, place, and time. She appears well-developed and well-nourished.  Neurological: She is alert and oriented to person, place, and time.  Psychiatric: She has a normal mood and affect. Her behavior is normal. Judgment and thought content normal.   BP 126/64 mmHg  Pulse 80  Temp(Src) 98 F (36.7 C) (Oral)  Resp 16  Wt   Results for orders placed or performed in visit on  07/18/15  POCT glycosylated hemoglobin (Hb A1C)  Result Value Ref Range   Hemoglobin A1C 5.9    Est. average glucose Bld gHb Est-mCnc 123        Assessment & Plan:   1. Type 2 diabetes mellitus without complication, without long-term current use of insulin (HCC) Condition is stable. Please continue current medication and  plan of care as noted.   - POCT glycosylated hemoglobin (Hb A1C)  2. Benign hypertension Condition is stable. Please continue current medication and  plan of care as noted.    3. Constipation, unspecified constipation type Complicated story. Unclear if impaction or  Colitis. Will repeat regiment and refer back to GI if does not improve.    4. Memory loss New problem. Will refer to evaluate and treat.  - Ambulatory referral to Neurology  5. Hypercholesteremia Will restart medication.  Recheck labs at follow up.   - simvastatin (ZOCOR) 20 MG tablet; Take 1 tablet (20 mg total) by mouth at bedtime.  Dispense: 1 tablet; Refill: 1  6. Focal lymphocytic colitis Unclear if recurrent problem. GI referral if does not improve.    Margarita Rana, MD

## 2015-07-19 ENCOUNTER — Ambulatory Visit: Payer: Medicare Other

## 2015-07-21 ENCOUNTER — Telehealth: Payer: Self-pay | Admitting: Family Medicine

## 2015-07-21 ENCOUNTER — Ambulatory Visit: Payer: Medicare Other

## 2015-07-21 DIAGNOSIS — N3090 Cystitis, unspecified without hematuria: Secondary | ICD-10-CM

## 2015-07-21 MED ORDER — AMOXICILLIN-POT CLAVULANATE 875-125 MG PO TABS: 1.0000 | ORAL_TABLET | Freq: Two times a day (BID) | ORAL | Status: DC

## 2015-07-21 NOTE — Telephone Encounter (Signed)
Ok to send in rx for Augmentin 875 bid for 7 days. OV to does not improve. Thanks.

## 2015-07-21 NOTE — Telephone Encounter (Signed)
Pt says she know she has a UTI but she was just in here Monday.  She wants to know if you will call in something at [facility].  Pt's callback is [phone]  Thanks Con Memos

## 2015-07-21 NOTE — Telephone Encounter (Signed)
Pt husband said please do not send Cipro.  It makes her sick.  Thanks Con Memos

## 2015-07-25 ENCOUNTER — Other Ambulatory Visit: Payer: Self-pay | Admitting: Family Medicine

## 2015-07-26 ENCOUNTER — Ambulatory Visit: Payer: Medicare Other

## 2015-07-28 ENCOUNTER — Ambulatory Visit: Payer: Medicare Other

## 2015-08-02 ENCOUNTER — Ambulatory Visit: Payer: Medicare Other

## 2015-08-04 ENCOUNTER — Ambulatory Visit: Payer: Medicare Other

## 2015-08-09 ENCOUNTER — Ambulatory Visit: Payer: Medicare Other

## 2015-08-11 ENCOUNTER — Ambulatory Visit: Payer: Medicare Other

## 2015-08-17 ENCOUNTER — Ambulatory Visit
Admission: RE | Admit: 2015-08-17 | Discharge: 2015-08-17 | Disposition: A | Discharge status: Home / Self Care | Payer: Medicare Other | Source: Ambulatory Visit | Attending: Family Medicine | Admitting: Family Medicine

## 2015-08-17 ENCOUNTER — Ambulatory Visit (INDEPENDENT_AMBULATORY_CARE_PROVIDER_SITE_OTHER): Payer: Medicare Other | Admitting: Family Medicine

## 2015-08-17 ENCOUNTER — Encounter: Payer: Self-pay | Admitting: Family Medicine

## 2015-08-17 VITALS — BP 130/62 | HR 80 | Temp 97.5°F | Resp 18

## 2015-08-17 DIAGNOSIS — R05 Cough: Secondary | ICD-10-CM

## 2015-08-17 DIAGNOSIS — J40 Bronchitis, not specified as acute or chronic: Secondary | ICD-10-CM | POA: Diagnosis not present

## 2015-08-17 DIAGNOSIS — R059 Cough, unspecified: Secondary | ICD-10-CM

## 2015-08-17 MED ORDER — PREDNISONE 10 MG (21) PO TBPK: 10.0000 mg | ORAL_TABLET | Freq: Every day | ORAL | Status: DC

## 2015-08-17 MED ORDER — DOXYCYCLINE HYCLATE 100 MG PO TABS: 100.0000 mg | ORAL_TABLET | Freq: Two times a day (BID) | ORAL | Status: DC

## 2015-08-17 NOTE — Progress Notes (Signed)
Patient ID: Deborah [name], female   DOB: 1931/01/30, 82 y.o.   MRN: [id number]    Subjective:  HPI Pt is here today for a cough. She reports that it has been occuring for about 3 weeks. It is a dry cough and is worse at night when she lays down. She does not feel bad or feel like she has a cold. She does have hoarseness and feels like her mouth is dry. Denies shortness of breath, fevers, chills, running nose, congestion, chest pain, reflux or belching.  No chest pain, dyspnea on exertion, PND, or fevers.  Prior to Admission medications   Medication Sig Start Date End Date Taking? Authorizing Provider  acetaminophen (TYLENOL) 500 MG tablet Take 500 mg by mouth every 6 (six) hours as needed.   Yes Historical Provider, MD  amLODipine (NORVASC) 5 MG tablet TAKE 2 TABLETS BY MOUTH ONCE DAILY 06/20/15  Yes Margarita Rana, MD  aspirin 81 MG tablet Take 81 mg by mouth daily.     Yes Historical Provider, MD  budesonide (ENTOCORT EC) 3 MG 24 hr capsule Take 2 capsules (6 mg total) by mouth daily. 07/05/15  Yes Margarita Rana, MD  escitalopram (LEXAPRO) 20 MG tablet Take 1 tablet (20 mg total) by mouth daily. 12/17/14  Yes Margarita Rana, MD  glipiZIDE (GLUCOTROL) 5 MG tablet TAKE 1 TABLET BY MOUTH ONCE A DAY 07/25/15  Yes Margarita Rana, MD  glyBURIDE (DIABETA) 2.5 MG tablet Take 2.5 mg by mouth daily with breakfast.     Yes Historical Provider, MD  hydrochlorothiazide (MICROZIDE) 12.5 MG capsule TAKE 1 CAPSULE BY MOUTH ONCE DAILY 06/24/15  Yes Margarita Rana, MD  imipramine (TOFRANIL) 25 MG tablet Take 25 mg by mouth 2 (two) times daily.    Yes Historical Provider, MD  amoxicillin-clavulanate (AUGMENTIN) 875-125 MG tablet Take 1 tablet by mouth 2 (two) times daily. Patient not taking: Reported on 08/17/2015 07/21/15   Margarita Rana, MD  buPROPion [facility] SR) 150 MG 12 hr tablet TAKE 1 TABLET BY MOUTH 2 TIMES DAILY 07/25/15   Margarita Rana, MD  Cetirizine HCl 10 MG CAPS Take 1 capsule by mouth as needed. Reported on  08/17/2015 06/15/13   Historical Provider, MD  LORazepam (ATIVAN) 0.5 MG tablet TAKE 1 TABLET BY MOUTH TWICE A DAY 01/31/15   Margarita Rana, MD  magnesium citrate SOLN Drink 1/2 bottle then drink last half bottle six hours later. Patient not taking: Reported on 07/18/2015 05/31/15   Margarita Rana, MD  polyethylene glycol powder (GLYCOLAX/MIRALAX) powder Take 17 g by mouth daily. Patient not taking: Reported on 08/17/2015 05/31/15   Margarita Rana, MD  simvastatin (ZOCOR) 20 MG tablet Take 20 mg by mouth at bedtime. Reported on 08/17/2015 07/18/15   Margarita Rana, MD    Patient Active Problem List   Diagnosis Date Noted  . Memory loss 07/18/2015  . Depression 01/12/2015  . Shoulder pain 12/17/2014  . Pain in shoulder 08/05/2014  . Gonalgia 07/21/2014  . History of knee surgery 07/21/2014  . Allergic rhinitis 07/16/2014  . Anxiety 07/16/2014  . Benign hypertension 07/16/2014  . Alteration in bowel elimination: incontinence 07/16/2014  . Chronic kidney disease (CKD), stage III (moderate) 07/16/2014  . Colitis 07/16/2014  . CN (constipation) 07/16/2014  . D (diarrhea) 07/16/2014  . Contracture of palmar fascia (Dupuytren's) 07/16/2014  . Hypercholesteremia 07/16/2014  . Hypoxia 07/16/2014  . Absence of bladder continence 07/16/2014  . Focal lymphocytic colitis 07/16/2014  . Extreme obesity ([place]) 07/16/2014  . Arthritis, degenerative  07/16/2014  . Disorder of peripheral nervous system ([place]) 07/16/2014  . Neuralgia neuritis, sciatic nerve 07/16/2014  . Hyperparathyroidism, secondary ([place]) 07/16/2014  . Apnea, sleep 07/16/2014  . Subclinical hypothyroidism 07/16/2014  . Diabetes mellitus, type 2 ([place]) 07/16/2014  . 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 Urinary incontinence without sensory awareness 09/03/2012  . Palpitations 12/28/2010  . SOB (shortness of breath) 12/28/2010  . Overweight 08/02/2009    Past Medical History  Diagnosis Date  . Hyperlipidemia   . Diabetes mellitus   .  Palpitations   . Hypertension   . Osteoarthritis   . Contracture of palmar fascia   . Cough   . Anxiety   . Otitis externa   . Unspecified adjustment reaction   . Unspecified urinary incontinence   . Secondary hyperparathyroidism (of renal origin)   . Chronic kidney disease, stage III (moderate)   . Pure hypercholesterolemia   . Other specified acquired hypothyroidism   . Unspecified hereditary and idiopathic peripheral neuropathy   . Peripheral neuropathy ([place])   . Obesity   . Sleep apnea     Social History   Social History  . Marital Status: Married    Spouse Name: N/A  . Number of Children: 2  . Years of Education: N/A   Occupational History  . Retired [employer]    Worked in Proofreader.    Social History Main Topics  . Smoking status: Former Smoker -- 10.00 packs/day for 1 years    Types: Cigarettes    Quit date: 12/27/1997  . Smokeless tobacco: Never Used  . Alcohol Use: No  . Drug Use: No  . Sexual Activity: No   Other Topics Concern  . Not on file   Social History Narrative    Allergies  Allergen Reactions  . Ace Inhibitors     cough  . Ciprofloxacin Hcl   . Quinolones Other (See Comments)    Tendon rupture    Review of Systems  Constitutional: Negative.   HENT: Negative.   Eyes: Negative.   Respiratory: Positive for cough.   Cardiovascular: Negative.   Gastrointestinal: Negative.   Genitourinary: Negative.   Musculoskeletal: Negative.   Skin: Negative.   Neurological: Negative.   Endo/Heme/Allergies: Negative.   Psychiatric/Behavioral: Negative.     Immunization History  Administered Date(s) Administered  . Influenza, High Dose Seasonal PF 12/17/2014  . Pneumococcal Conjugate-13 11/16/2013  . Pneumococcal Polysaccharide-23 11/12/2007  . Tdap 03/10/2015  . Zoster 07/27/2010   Objective:  BP 130/62 mmHg  Pulse 80  Temp(Src) 97.5 F (36.4 C) (Oral)  Resp 18  SpO2 94%  Physical Exam  Constitutional: She is oriented  to person, place, and time and well-developed, well-nourished, and in no distress.  HENT:  Head: Normocephalic and atraumatic.  Right Ear: External ear normal.  Left Ear: External ear normal.  Nose: Nose normal.  Mouth/Throat: Oropharynx is clear and moist.  Eyes: Conjunctivae and EOM are normal. Pupils are equal, round, and reactive to light.  Neck: Normal range of motion. Neck supple.  Cardiovascular: Normal rate, regular rhythm, normal heart sounds and intact distal pulses.   Pulmonary/Chest: Effort normal.  Mild decreased breath sounds in lower lobes  Abdominal: Soft.  Musculoskeletal: Normal range of motion. She exhibits no edema or tenderness.  Negative cords, negative Homans.  Neurological: She is alert and oriented to person, place, and time. She has normal reflexes. Gait normal. GCS score is 15.  Skin: Skin is warm and dry.  Psychiatric: Mood, memory, affect and  judgment normal.    Lab Results  Component Value Date   WBC 8.7 12/20/2014   HGB 12.9 05/28/2014   HCT 42.3 12/20/2014   PLT 219 12/20/2014   GLUCOSE 102* 12/20/2014   CHOL 171 09/08/2012   TRIG 202* 09/08/2012   HDL 42 09/08/2012   LDLCALC 89 09/08/2012   TSH 3.700 12/20/2014   INR 1.0 04/08/2008   HGBA1C 5.9 07/18/2015    CMP     Component Value Date/Time   NA 143 12/20/2014 1603   NA 139 05/28/2014 1023   NA 137 04/11/2008 0810   K 3.6 12/20/2014 1603   K 3.3* 05/28/2014 1023   CL 97 12/20/2014 1603   CL 101 05/28/2014 1023   CO2 31* 12/20/2014 1603   CO2 31 05/28/2014 1023   GLUCOSE 102* 12/20/2014 1603   GLUCOSE 121* 05/28/2014 1023   GLUCOSE 107* 04/11/2008 0810   BUN 19 12/20/2014 1603   BUN 21* 05/28/2014 1023   BUN 22 04/11/2008 0810   CREATININE 1.12* 12/20/2014 1603   CREATININE 1.4* 06/10/2014   CREATININE 1.29* 05/28/2014 1023   CALCIUM 9.7 12/20/2014 1603   CALCIUM 8.9 05/28/2014 1023   PROT 6.3 12/20/2014 1603   PROT 6.9 05/28/2014 1023   PROT 7.3 04/08/2008 1747   ALBUMIN  3.8 12/20/2014 1603   ALBUMIN 3.7 05/28/2014 1023   ALBUMIN 3.8 04/08/2008 1747   AST 15 12/20/2014 1603   AST 17 05/28/2014 1023   ALT 15 12/20/2014 1603   ALT 15 05/28/2014 1023   ALKPHOS 66 12/20/2014 1603   ALKPHOS 54 05/28/2014 1023   BILITOT 0.3 12/20/2014 1603   BILITOT 0.6 05/28/2014 1023   BILITOT 0.6 04/08/2008 1747   GFRNONAA 46* 12/20/2014 1603   GFRNONAA 39* 05/28/2014 1023   GFRAA 53* 12/20/2014 1603   GFRAA 45* 05/28/2014 1023    Assessment and Plan :  1. Cough  Treat as a Chronic bronchitis. Could be allergic, could be GERD, less likely CHF. Very unlikely PE. Consider spirometry in the future. - DG Chest 2 View; Future  2. Bronchitis  - predniSONE (STERAPRED UNI-PAK 21 TAB) 10 MG (21) TBPK tablet; Take 1 tablet (10 mg total) by mouth daily. Taper as directed  Dispense: 21 tablet; Refill: 0 - doxycycline (VIBRA-TABS) 100 MG tablet; Take 1 tablet (100 mg total) by mouth 2 (two) times daily.  Dispense: 20 tablet; Refill: 0 3. Morbid obesity  Patient was seen and examined by Dr. Miguel Aschoff, and noted scribed by Webb Laws, [place] MD [facility] 08/17/2015 2:09 PM

## 2015-09-29 ENCOUNTER — Other Ambulatory Visit: Payer: Self-pay

## 2015-09-29 NOTE — Telephone Encounter (Signed)
Pt requesting refill on Imipramine 25 mg 2 tabs qhs to be sent to [facility]. Was being filled by Dr. Jacqlyn Larsen, but [facility] is too far away for pt. Renaldo Fiddler, CMA

## 2015-10-06 ENCOUNTER — Other Ambulatory Visit: Payer: Self-pay | Admitting: Neurology

## 2015-10-06 DIAGNOSIS — R413 Other amnesia: Secondary | ICD-10-CM

## 2015-10-06 DIAGNOSIS — R410 Disorientation, unspecified: Secondary | ICD-10-CM

## 2015-10-07 ENCOUNTER — Other Ambulatory Visit: Payer: Self-pay

## 2015-10-07 MED ORDER — IMIPRAMINE HCL 25 MG PO TABS: 25.0000 mg | ORAL_TABLET | Freq: Two times a day (BID) | ORAL | 5 refills | Status: DC

## 2015-10-10 ENCOUNTER — Ambulatory Visit (INDEPENDENT_AMBULATORY_CARE_PROVIDER_SITE_OTHER): Payer: Medicare Other | Admitting: Family Medicine

## 2015-10-10 VITALS — BP 134/66 | HR 76 | Temp 97.6°F | Resp 16

## 2015-10-10 DIAGNOSIS — R350 Frequency of micturition: Secondary | ICD-10-CM

## 2015-10-10 DIAGNOSIS — IMO0001 Reserved for inherently not codable concepts without codable children: Secondary | ICD-10-CM

## 2015-10-10 NOTE — Progress Notes (Signed)
Deborah [name]  MRN: [id number] DOB: 1930-06-18  Subjective:  HPI  The patient is an 82 year old female who presents for evaluation of possible Urinary infection.  The patient states that for about 4 days she has had increased frequency and increased incontinence.  She has had no burning, pain or hematuria.  She has not had any fever.  Patient Active Problem List   Diagnosis Date Noted  . Memory loss 07/18/2015  . Depression 01/12/2015  . Shoulder pain 12/17/2014  . Pain in shoulder 08/05/2014  . Gonalgia 07/21/2014  . History of knee surgery 07/21/2014  . Allergic rhinitis 07/16/2014  . Anxiety 07/16/2014  . Benign hypertension 07/16/2014  . Alteration in bowel elimination: incontinence 07/16/2014  . Chronic kidney disease (CKD), stage III (moderate) 07/16/2014  . Colitis 07/16/2014  . CN (constipation) 07/16/2014  . D (diarrhea) 07/16/2014  . Contracture of palmar fascia (Dupuytren's) 07/16/2014  . Hypercholesteremia 07/16/2014  . Hypoxia 07/16/2014  . Absence of bladder continence 07/16/2014  . Focal lymphocytic colitis 07/16/2014  . Extreme obesity ([place]) 07/16/2014  . Arthritis, degenerative 07/16/2014  . Disorder of peripheral nervous system ([place]) 07/16/2014  . Neuralgia neuritis, sciatic nerve 07/16/2014  . Hyperparathyroidism, secondary ([place]) 07/16/2014  . Apnea, sleep 07/16/2014  . Subclinical hypothyroidism 07/16/2014  . Diabetes mellitus, type 2 ([place]) 07/16/2014  . 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 Urinary incontinence without sensory awareness 09/03/2012  . Palpitations 12/28/2010  . SOB (shortness of breath) 12/28/2010  . Overweight 08/02/2009    Past Medical History:  Diagnosis Date  . Anxiety   . Chronic kidney disease, stage III (moderate)   . Contracture of palmar fascia   . Cough   . Diabetes mellitus   . Hyperlipidemia   . Hypertension   . Obesity   . Osteoarthritis   . Other specified acquired hypothyroidism   . Otitis externa   .  Palpitations   . Peripheral neuropathy ([place])   . Pure hypercholesterolemia   . Secondary hyperparathyroidism (of renal origin)   . Sleep apnea   . Unspecified adjustment reaction   . Unspecified hereditary and idiopathic peripheral neuropathy   . Unspecified urinary incontinence     Social History   Social History  . Marital status: Married    Spouse name: N/A  . Number of children: 2  . Years of education: N/A   Occupational History  . Retired [employer]    Worked in Proofreader.    Social History Main Topics  . Smoking status: Former Smoker    Packs/day: 10.00    Years: 1.00    Types: Cigarettes    Quit date: 12/27/1997  . Smokeless tobacco: Never Used  . Alcohol use No  . Drug use: No  . Sexual activity: No   Other Topics Concern  . Not on file   Social History Narrative  . No narrative on file      Allergies  Allergen Reactions  . Ace Inhibitors     cough  . Ciprofloxacin Hcl   . Quinolones Other (See Comments)    Tendon rupture   Outpatient Encounter Prescriptions as of 10/10/2015  Medication Sig Note  . acetaminophen (TYLENOL) 500 MG tablet Take 500 mg by mouth every 6 (six) hours as needed.   Marland Kitchen amLODipine (NORVASC) 5 MG tablet TAKE 2 TABLETS BY MOUTH ONCE DAILY   . aspirin 81 MG tablet Take 81 mg by mouth daily.     . budesonide (ENTOCORT EC) 3 MG  24 hr capsule Take 2 capsules (6 mg total) by mouth daily.   Marland Kitchen buPROPion (WELLBUTRIN SR) 150 MG 12 hr tablet TAKE 1 TABLET BY MOUTH 2 TIMES DAILY   . Cetirizine HCl 10 MG CAPS Take 1 capsule by mouth as needed. Reported on 08/17/2015 07/16/2014: Received from: [facility]  . escitalopram (LEXAPRO) 20 MG tablet Take 1 tablet (20 mg total) by mouth daily.   Marland Kitchen glipiZIDE (GLUCOTROL) 5 MG tablet TAKE 1 TABLET BY MOUTH ONCE A DAY   . glyBURIDE (DIABETA) 2.5 MG tablet Take 2.5 mg by mouth daily with breakfast.     . hydrochlorothiazide (MICROZIDE) 12.5 MG capsule TAKE 1 CAPSULE BY MOUTH  ONCE DAILY   . imipramine (TOFRANIL) 25 MG tablet Take 1 tablet (25 mg total) by mouth 2 (two) times daily.   Marland Kitchen LORazepam (ATIVAN) 0.5 MG tablet TAKE 1 TABLET BY MOUTH TWICE A DAY   . magnesium citrate SOLN Drink 1/2 bottle then drink last half bottle six hours later.   . polyethylene glycol powder (GLYCOLAX/MIRALAX) powder Take 17 g by mouth daily.   . simvastatin (ZOCOR) 20 MG tablet Take 20 mg by mouth at bedtime. Reported on 08/17/2015   . [DISCONTINUED] amoxicillin-clavulanate (AUGMENTIN) 875-125 MG tablet Take 1 tablet by mouth 2 (two) times daily. (Patient not taking: Reported on 08/17/2015)   . [DISCONTINUED] doxycycline (VIBRA-TABS) 100 MG tablet Take 1 tablet (100 mg total) by mouth 2 (two) times daily.   . [DISCONTINUED] predniSONE (STERAPRED UNI-PAK 21 TAB) 10 MG (21) TBPK tablet Take 1 tablet (10 mg total) by mouth daily. Taper as directed    No facility-administered encounter medications on file as of 10/10/2015.    Review of Systems  Constitutional: Positive for malaise/fatigue. Negative for chills, fever and weight loss.  Respiratory: Negative for cough, shortness of breath and wheezing.   Cardiovascular: Negative for chest pain, palpitations, orthopnea and leg swelling.  Genitourinary: Positive for frequency and urgency. Negative for dysuria, flank pain and hematuria.  Neurological: Positive for weakness and headaches. Negative for dizziness.   Objective:  BP 134/66   Pulse 76   Temp 97.6 F (36.4 C) (Oral)   Resp 16   Physical Exam  Constitutional: She is well-developed, well-nourished, and in no distress.  HENT:  Head: Normocephalic and atraumatic.  Eyes: Pupils are equal, round, and reactive to light.  Neck: Normal range of motion.  Cardiovascular: Normal rate, regular rhythm and normal heart sounds.   Pulmonary/Chest: Effort normal and breath sounds normal.  Abdominal:  Low CVA tenderness    Assessment and Plan :   1. Frequency  - Urine culture 2. Possible  UTI 3. Morbid obesity Patient was seen and examined by Dr. Delfino Lovett L. [place] Mon and the note was scribed by Althea Charon, RMA.  Miguel Aschoff MD [facility] 10/10/2015 3:12 PM

## 2015-10-14 LAB — URINE CULTURE

## 2015-10-18 ENCOUNTER — Telehealth: Payer: Self-pay

## 2015-10-18 MED ORDER — NITROFURANTOIN MONOHYD MACRO 100 MG PO CAPS: 100.0000 mg | ORAL_CAPSULE | Freq: Two times a day (BID) | ORAL | 0 refills | Status: DC

## 2015-10-18 NOTE — Telephone Encounter (Signed)
-----   Message from Jerrol Banana., MD sent at 10/18/2015  9:13 AM EDT ----- Mild UTI, sensitive to nitrofurantoin 100 mg twice a day for 3 days.

## 2015-10-18 NOTE — Telephone Encounter (Signed)
Pt advised and rx sent in. Renaldo Fiddler, CMA

## 2015-10-19 ENCOUNTER — Other Ambulatory Visit: Payer: Self-pay | Admitting: Family Medicine

## 2015-10-19 DIAGNOSIS — I10 Essential (primary) hypertension: Secondary | ICD-10-CM

## 2015-10-25 ENCOUNTER — Encounter: Payer: Self-pay | Admitting: Radiology

## 2015-10-25 ENCOUNTER — Ambulatory Visit
Admission: RE | Admit: 2015-10-25 | Discharge: 2015-10-25 | Disposition: A | Discharge status: Home / Self Care | Payer: Medicare Other | Source: Ambulatory Visit | Attending: Neurology | Admitting: Neurology

## 2015-10-25 DIAGNOSIS — R9082 White matter disease, unspecified: Secondary | ICD-10-CM | POA: Diagnosis not present

## 2015-10-25 DIAGNOSIS — R410 Disorientation, unspecified: Secondary | ICD-10-CM

## 2015-10-25 DIAGNOSIS — R269 Unspecified abnormalities of gait and mobility: Secondary | ICD-10-CM | POA: Insufficient documentation

## 2015-10-25 DIAGNOSIS — R413 Other amnesia: Secondary | ICD-10-CM | POA: Diagnosis present

## 2015-10-25 DIAGNOSIS — G319 Degenerative disease of nervous system, unspecified: Secondary | ICD-10-CM | POA: Diagnosis not present

## 2015-11-05 ENCOUNTER — Emergency Department
Admission: EM | Admit: 2015-11-05 | Discharge: 2015-11-06 | Disposition: A | Discharge status: Home / Self Care | Payer: Medicare Other | Attending: Emergency Medicine | Admitting: Emergency Medicine

## 2015-11-05 ENCOUNTER — Emergency Department: Payer: Medicare Other

## 2015-11-05 ENCOUNTER — Encounter: Payer: Self-pay | Admitting: Urgent Care

## 2015-11-05 DIAGNOSIS — N390 Urinary tract infection, site not specified: Secondary | ICD-10-CM | POA: Insufficient documentation

## 2015-11-05 DIAGNOSIS — Y9389 Activity, other specified: Secondary | ICD-10-CM | POA: Insufficient documentation

## 2015-11-05 DIAGNOSIS — I129 Hypertensive chronic kidney disease with stage 1 through stage 4 chronic kidney disease, or unspecified chronic kidney disease: Secondary | ICD-10-CM | POA: Insufficient documentation

## 2015-11-05 DIAGNOSIS — N182 Chronic kidney disease, stage 2 (mild): Secondary | ICD-10-CM | POA: Insufficient documentation

## 2015-11-05 DIAGNOSIS — S8001XA Contusion of right knee, initial encounter: Secondary | ICD-10-CM | POA: Insufficient documentation

## 2015-11-05 DIAGNOSIS — R51 Headache: Secondary | ICD-10-CM | POA: Diagnosis not present

## 2015-11-05 DIAGNOSIS — Z87891 Personal history of nicotine dependence: Secondary | ICD-10-CM | POA: Insufficient documentation

## 2015-11-05 DIAGNOSIS — W19XXXA Unspecified fall, initial encounter: Secondary | ICD-10-CM | POA: Diagnosis not present

## 2015-11-05 DIAGNOSIS — E1122 Type 2 diabetes mellitus with diabetic chronic kidney disease: Secondary | ICD-10-CM | POA: Insufficient documentation

## 2015-11-05 DIAGNOSIS — Y999 Unspecified external cause status: Secondary | ICD-10-CM | POA: Insufficient documentation

## 2015-11-05 DIAGNOSIS — S40011A Contusion of right shoulder, initial encounter: Secondary | ICD-10-CM | POA: Insufficient documentation

## 2015-11-05 DIAGNOSIS — M25511 Pain in right shoulder: Secondary | ICD-10-CM

## 2015-11-05 DIAGNOSIS — Y92018 Other place in single-family (private) house as the place of occurrence of the external cause: Secondary | ICD-10-CM | POA: Diagnosis not present

## 2015-11-05 DIAGNOSIS — T148XXA Other injury of unspecified body region, initial encounter: Secondary | ICD-10-CM

## 2015-11-05 LAB — BASIC METABOLIC PANEL
Anion gap: 11 (ref 5–15)
BUN: 24 mg/dL — ABNORMAL HIGH (ref 6–20)
CALCIUM: 9 mg/dL (ref 8.9–10.3)
CO2: 28 mmol/L (ref 22–32)
CREATININE: 1.56 mg/dL — AB (ref 0.44–1.00)
Chloride: 98 mmol/L — ABNORMAL LOW (ref 101–111)
GFR calc Af Amer: 35 mL/min — ABNORMAL LOW (ref >60)
GFR calc non Af Amer: 30 mL/min — ABNORMAL LOW (ref >60)
GLUCOSE: 153 mg/dL — AB (ref 65–99)
Potassium: 3.1 mmol/L — ABNORMAL LOW (ref 3.5–5.1)
Sodium: 137 mmol/L (ref 135–145)

## 2015-11-05 LAB — CBC
HEMATOCRIT: 42.1 % (ref 35.0–47.0)
Hemoglobin: 14.5 g/dL (ref 12.0–16.0)
MCH: 28.9 pg (ref 26.0–34.0)
MCHC: 34.6 g/dL (ref 32.0–36.0)
MCV: 83.7 fL (ref 80.0–100.0)
PLATELETS: 221 10*3/uL (ref 150–440)
RBC: 5.02 MIL/uL (ref 3.80–5.20)
RDW: 16.2 % — AB (ref 11.5–14.5)
WBC: 15.9 10*3/uL — ABNORMAL HIGH (ref 3.6–11.0)

## 2015-11-05 LAB — TROPONIN I: Troponin I: <0.03 ng/mL (ref <0.03)

## 2015-11-05 NOTE — ED Notes (Signed)
Placed pt on bedpan, pt unable to urinate. Pt had large amount of urine in incontinence pad. Pt was instructed to alert RN to when she felt she would be able to provide urine specimen. MD notified

## 2015-11-05 NOTE — ED Notes (Signed)
Patient transported to CT 

## 2015-11-05 NOTE — ED Notes (Signed)
Patient transported to X-ray 

## 2015-11-05 NOTE — Discharge Instructions (Addendum)
1. Take antibiotic as prescribed (Keflex 500 mg 3 times daily 7 days). 2. Return to the ER for worsening symptoms, persistent vomiting, difficulty breathing or other concerns.

## 2015-11-05 NOTE — ED Notes (Signed)
Placed fall mats next to bed, bed alarm on pt, fall-risk bracelet on pt.

## 2015-11-05 NOTE — ED Triage Notes (Addendum)
Patient presents with c/o pain to the RIGHT side of her head and to her RIGHT shoulder s/p fall. Patient reports that she "just fell". Patient reports multiple recent falls without warning; states, "I have no idea when it is going to happen. I just fall"; reports 14 falls since December. She has had a recent MRI here at the hospital. Denies LOC.

## 2015-11-05 NOTE — ED Notes (Signed)
Patient to stat desk via EMS from home post fall.  Per EMS patient complains of right shoulder and right knee pain.  Patient reports unsure why she fell.  Reports history of 13 falls in the past few months.

## 2015-11-05 NOTE — ED Provider Notes (Signed)
[facility] Emergency Department Provider Note        Time seen: ----------------------------------------- 9:04 PM on 11/05/2015 -----------------------------------------    I have reviewed the triage vital signs and the nursing notes.   HISTORY  Chief Complaint Fall and Shoulder Pain    HPI Deborah [name] is a 82 y.o. female who presents to ER with complaints of pain in the right side of her head into her shoulder after a fall. Patient reports she just fell and is not sure why. Patient reports she's had multiple recent falls without warnings, states have no idea what this could happen. Patient reports 14 falls since December. She recently had an MRI here at the hospital for similar. Patient denies any recent illness, complaining of right head and right shoulder pain.   Past Medical History:  Diagnosis Date  . Anxiety   . Chronic kidney disease, stage III (moderate)   . Contracture of palmar fascia   . Cough   . Diabetes mellitus   . Hyperlipidemia   . Hypertension   . Obesity   . Osteoarthritis   . Other specified acquired hypothyroidism   . Otitis externa   . Palpitations   . Peripheral neuropathy ([place])   . Pure hypercholesterolemia   . Secondary hyperparathyroidism (of renal origin)   . Sleep apnea   . Unspecified adjustment reaction   . Unspecified hereditary and idiopathic peripheral neuropathy   . Unspecified urinary incontinence     Patient Active Problem List   Diagnosis Date Noted  . Memory loss 07/18/2015  . Depression 01/12/2015  . Shoulder pain 12/17/2014  . Pain in shoulder 08/05/2014  . Gonalgia 07/21/2014  . History of knee surgery 07/21/2014  . Allergic rhinitis 07/16/2014  . Anxiety 07/16/2014  . Benign hypertension 07/16/2014  . Alteration in bowel elimination: incontinence 07/16/2014  . Chronic kidney disease (CKD), stage III (moderate) 07/16/2014  . Colitis 07/16/2014  . CN (constipation) 07/16/2014  . D  (diarrhea) 07/16/2014  . Contracture of palmar fascia (Dupuytren's) 07/16/2014  . Hypercholesteremia 07/16/2014  . Hypoxia 07/16/2014  . Absence of bladder continence 07/16/2014  . Focal lymphocytic colitis 07/16/2014  . Extreme obesity ([place]) 07/16/2014  . Arthritis, degenerative 07/16/2014  . Disorder of peripheral nervous system ([place]) 07/16/2014  . Neuralgia neuritis, sciatic nerve 07/16/2014  . Hyperparathyroidism, secondary ([place]) 07/16/2014  . Apnea, sleep 07/16/2014  . Subclinical hypothyroidism 07/16/2014  . Diabetes mellitus, type 2 ([place]) 07/16/2014  . 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 Urinary incontinence without sensory awareness 09/03/2012  . Palpitations 12/28/2010  . SOB (shortness of breath) 12/28/2010  . Overweight 08/02/2009    Past Surgical History:  Procedure Laterality Date  . BACK SURGERY  1990   Ruptured Disc.  Marland Kitchen CARDIAC CATHETERIZATION  03/2008  . COLONOSCOPY    . HAND SURGERY Right   . REPLACEMENT TOTAL KNEE Bilateral   . SHOULDER SURGERY Right 06/2007    Allergies Ace inhibitors; Ciprofloxacin hcl; and Quinolones  Social History Social History  Substance Use Topics  . Smoking status: Former Smoker    Packs/day: 10.00    Years: 1.00    Types: Cigarettes    Quit date: 12/27/1997  . Smokeless tobacco: Never Used  . Alcohol use No    Review of Systems Constitutional: Negative for fever. Cardiovascular: Negative for chest pain. Respiratory: Negative for shortness of breath. Gastrointestinal: Negative for abdominal pain, vomiting and diarrhea. Genitourinary: Negative for dysuria. Musculoskeletal: Positive for right shoulder and right knee pain Skin: Negative  for rash. Neurological: Negative for headaches, focal weakness or numbness.Positive for chronic balance disturbance  10-point ROS otherwise negative.  ____________________________________________   PHYSICAL EXAM:  VITAL SIGNS: ED Triage Vitals  Enc Vitals Group     BP 11/05/15  2041 139/61     Pulse Rate 11/05/15 2041 80     Resp 11/05/15 2041 (!) 24     Temp 11/05/15 2041 98 F (36.7 C)     Temp Source 11/05/15 2041 Oral     SpO2 11/05/15 2041 97 %     Weight 11/05/15 2044 (!) 325 lb (147.4 kg)     Height 11/05/15 2044 5' 9"  (1.753 m)     Head Circumference --      Peak Flow --      Pain Score 11/05/15 2050 7     Pain Loc --      Pain Edu? --      Excl. in [place]? --    Constitutional: Alert and oriented. Well appearing and in no distress. Eyes: Conjunctivae are normal. PERRL. Normal extraocular movements. ENT   Head: Normocephalic and atraumatic.Mild right scalp tenderness   Nose: No congestion/rhinnorhea.   Mouth/Throat: Mucous membranes are moist.   Neck: No stridor. Cardiovascular: Normal rate, regular rhythm. No murmurs, rubs, or gallops. Respiratory: Normal respiratory effort without tachypnea nor retractions. Breath sounds are clear and equal bilaterally. No wheezes/rales/rhonchi. Gastrointestinal: Soft and nontender. Normal bowel sounds Musculoskeletal: No edema, limited range of motion of her extremities. Tenderness in the right shoulder, right knee Neurologic:  Normal speech and language. No gross focal neurologic deficits are appreciated. Patient is very unsteady on her feet. Skin:  Skin is warm, dry and intact. No rash noted. Psychiatric: Mood and affect are normal. Speech and behavior are normal.  ____________________________________________  EKG: Interpreted by me. Normal sinus rhythm rate of 79 bpm, normal PR interval, normal QRS, normal QT interval. Normal axis.  ____________________________________________  ED COURSE:  Pertinent labs & imaging results that were available during my care of the patient were reviewed by me and considered in my medical decision making (see chart for details). Clinical Course  Patient presents to ER in no acute distress, has multiple falls and has been evaluated extensively by neurology for same.  We will assess with basic labs and imaging.  Procedures ____________________________________________   LABS (pertinent positives/negatives)  Labs Reviewed  CBC - Abnormal; Notable for the following:       Result Value   WBC 15.9 (*)    RDW 16.2 (*)    All other components within normal limits  BASIC METABOLIC PANEL - Abnormal; Notable for the following:    Potassium 3.1 (*)    Chloride 98 (*)    Glucose, Bld 153 (*)    BUN 24 (*)    Creatinine, Ser 1.56 (*)    GFR calc non Af Amer 30 (*)    GFR calc Af Amer 35 (*)    All other components within normal limits  TROPONIN I  URINALYSIS COMPLETEWITH MICROSCOPIC ([facility] ONLY)    RADIOLOGY Images were viewed by me  CT head, right shoulder x-rays, right knee x-rays IMPRESSION: Moderate frontal atrophy bilaterally, stable. Mild periventricular small vessel disease. No intracranial mass, hemorrhage, or extra-axial fluid collection. No acute infarct evident. Foci of arterial vascular calcification. Mild left ethmoid sinus disease. IMPRESSION: 1. No evidence of fracture or dislocation. 2. Total knee arthroplasty is grossly unremarkable, without evidence of loosening. IMPRESSION: No evidence of fracture or dislocation.  ____________________________________________  FINAL ASSESSMENT AND PLAN  Fall, Shoulder contusion, dehydration  Plan: Patient with labs and imaging as dictated above. Patient is in no distress, has acute on chronic balance disturbance. Patient has multiple bruises but thankfully no underlying fracture. Urine is pending at this time.   Earleen Newport, MD   Note: This dictation was prepared with Dragon dictation. Any transcriptional errors that result from this process are unintentional    Earleen Newport, MD 11/05/15 2318

## 2015-11-05 NOTE — ED Notes (Signed)
Pt reports hx of 13 falls in the last 5 months. Pt has extensive bruising to right arm and right hip. Pt reports bruising is from fall on Saturday 9/16; pt reports she was not seen for fall. Pt reports she normally ambulates with walker at home. Pt has reported decreased ability to ambulate since fall on 9/16.   Pt c/o fall tonight. Pt fell to floor on right side, and was unable to get up off floor;EMS was called and retrieved pt from floor. Pt c/o pain to right shoulder, side, hip and knee. Pt is tender to palpation of right shoulder and hip.

## 2015-11-06 LAB — URINALYSIS COMPLETE WITH MICROSCOPIC (ARMC ONLY)
BACTERIA UA: NONE SEEN
Bilirubin Urine: NEGATIVE
Glucose, UA: NEGATIVE mg/dL
Hgb urine dipstick: NEGATIVE
Ketones, ur: NEGATIVE mg/dL
Leukocytes, UA: NEGATIVE
Nitrite: POSITIVE — AB
PROTEIN: NEGATIVE mg/dL
RBC / HPF: NONE SEEN RBC/hpf (ref 0–5)
Specific Gravity, Urine: 1.008 (ref 1.005–1.030)
pH: 6 (ref 5.0–8.0)

## 2015-11-06 MED ORDER — POTASSIUM CHLORIDE CRYS ER 20 MEQ PO TBCR
40.0000 meq | EXTENDED_RELEASE_TABLET | Freq: Once | ORAL | Status: AC
  Administered 2015-11-06: 40 meq via ORAL
  Filled 2015-11-06: qty 2

## 2015-11-06 MED ORDER — CEPHALEXIN 500 MG PO CAPS: 500.0000 mg | ORAL_CAPSULE | Freq: Three times a day (TID) | ORAL | 0 refills | Status: DC

## 2015-11-06 MED ORDER — CEPHALEXIN 500 MG PO CAPS
500.0000 mg | ORAL_CAPSULE | Freq: Once | ORAL | Status: AC
  Administered 2015-11-06: 500 mg via ORAL
  Filled 2015-11-06: qty 1

## 2015-11-06 NOTE — ED Provider Notes (Signed)
-----------------------------------------   12:35 AM on 11/06/2015 -----------------------------------------  Updated patient and family members of urinalysis results. Will add urine culture, but start treatment with Keflex. They will get in touch with their neurologist Dr. Manuella Ghazi who will refer them to [facility] neurology for second opinion. Patient has walker and transfer wheelchair at home. Strict return precautions given. All verbalize understanding and agree with plan of care.   Paulette Blanch, MD 11/06/15 [phone]

## 2015-11-06 NOTE — ED Notes (Signed)
Reviewed d/c instructions, follow-up care and prescriptions with pt. Pt verbalized understanding

## 2015-11-06 NOTE — ED Notes (Signed)
MD Beather Arbour at bedside.

## 2015-11-08 LAB — URINE CULTURE: SPECIAL REQUESTS: NORMAL

## 2015-11-28 ENCOUNTER — Ambulatory Visit (INDEPENDENT_AMBULATORY_CARE_PROVIDER_SITE_OTHER): Payer: Medicare Other | Admitting: Family Medicine

## 2015-11-28 VITALS — BP 128/62 | HR 82 | Temp 97.7°F | Resp 16

## 2015-11-28 DIAGNOSIS — Z23 Encounter for immunization: Secondary | ICD-10-CM

## 2015-11-28 DIAGNOSIS — I10 Essential (primary) hypertension: Secondary | ICD-10-CM | POA: Diagnosis not present

## 2015-11-28 DIAGNOSIS — R269 Unspecified abnormalities of gait and mobility: Secondary | ICD-10-CM

## 2015-11-28 DIAGNOSIS — E7800 Pure hypercholesterolemia, unspecified: Secondary | ICD-10-CM

## 2015-11-28 DIAGNOSIS — G629 Polyneuropathy, unspecified: Secondary | ICD-10-CM | POA: Diagnosis not present

## 2015-11-28 DIAGNOSIS — F3289 Other specified depressive episodes: Secondary | ICD-10-CM | POA: Diagnosis not present

## 2015-11-28 DIAGNOSIS — E119 Type 2 diabetes mellitus without complications: Secondary | ICD-10-CM | POA: Diagnosis not present

## 2015-11-28 NOTE — Progress Notes (Signed)
Deborah [name]  MRN: [id number] DOB: 08-16-1930  Subjective:  HPI  Patient is here for follow up  Diabetes: she checks her sugar and in the evening usually readings are around 110, 120.  Lab Results  Component Value Date   HGBA1C 5.9 07/18/2015   Hypertension: patient check her b/p and readings are usually around 130/62. BP Readings from Last 3 Encounters:  11/28/15 128/62  11/06/15 (!) 156/71  10/10/15 134/66   Hyperlipidemia: on her visit in June with Dr Venia Minks she was advised to re start Zocor and re check labs on next visit.  Patient also went to ER on 11/05/15 due to fall had shoulder contusion, UTI. CT head , right shoulder and right knee xray were done and there was no fracture/ SHe was also treated for UTI and was started on Keflex.  Patients husband states patient has falling 14 times in the past 5 months. This all started when she developed achilles tendon about 5 and a half months ago after been on Cipro for UTI per husband this medication can cause this issue in older people. Ever since then patient has not been able to walk or stand, when she tries to she just falls. She states she is unsteady and can no support herself. She has been using a wheelchair and is in process of getting a power wheelchair. Patient Active Problem List   Diagnosis Date Noted  . Memory loss 07/18/2015  . Depression 01/12/2015  . Shoulder pain 12/17/2014  . Pain in shoulder 08/05/2014  . Gonalgia 07/21/2014  . History of knee surgery 07/21/2014  . Allergic rhinitis 07/16/2014  . Anxiety 07/16/2014  . Benign hypertension 07/16/2014  . Alteration in bowel elimination: incontinence 07/16/2014  . Chronic kidney disease (CKD), stage III (moderate) 07/16/2014  . Colitis 07/16/2014  . CN (constipation) 07/16/2014  . D (diarrhea) 07/16/2014  . Contracture of palmar fascia (Dupuytren's) 07/16/2014  . Hypercholesteremia 07/16/2014  . Hypoxia 07/16/2014  . Absence of bladder continence  07/16/2014  . Focal lymphocytic colitis 07/16/2014  . Extreme obesity ([place]) 07/16/2014  . Arthritis, degenerative 07/16/2014  . Disorder of peripheral nervous system ([place]) 07/16/2014  . Neuralgia neuritis, sciatic nerve 07/16/2014  . Hyperparathyroidism, secondary ([place]) 07/16/2014  . Apnea, sleep 07/16/2014  . Subclinical hypothyroidism 07/16/2014  . Diabetes mellitus, type 2 ([place]) 07/16/2014  . 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 Urinary incontinence without sensory awareness 09/03/2012  . Palpitations 12/28/2010  . SOB (shortness of breath) 12/28/2010  . Overweight 08/02/2009    Past Medical History:  Diagnosis Date  . Anxiety   . Chronic kidney disease, stage III (moderate)   . Contracture of palmar fascia   . Cough   . Diabetes mellitus   . Hyperlipidemia   . Hypertension   . Obesity   . Osteoarthritis   . Other specified acquired hypothyroidism   . Otitis externa   . Palpitations   . Peripheral neuropathy ([place])   . Pure hypercholesterolemia   . Secondary hyperparathyroidism (of renal origin) ([place])   . Sleep apnea   . Unspecified adjustment reaction   . Unspecified hereditary and idiopathic peripheral neuropathy   . Unspecified urinary incontinence     Social History   Social History  . Marital status: Married    Spouse name: N/A  . Number of children: 2  . Years of education: N/A   Occupational History  . Retired [employer]    Worked in Proofreader.    Social History Main Topics  .  Smoking status: Former Smoker    Packs/day: 10.00    Years: 1.00    Types: Cigarettes    Quit date: 12/27/1997  . Smokeless tobacco: Never Used  . Alcohol use No  . Drug use: No  . Sexual activity: No   Other Topics Concern  . Not on file   Social History Narrative  . No narrative on file    Outpatient Encounter Prescriptions as of 11/28/2015  Medication Sig  . acetaminophen (TYLENOL) 500 MG tablet Take 500 mg by mouth every 6 (six) hours as needed.  Marland Kitchen  amLODipine (NORVASC) 5 MG tablet TAKE 2 TABLETS BY MOUTH ONCE DAILY  . aspirin 81 MG tablet Take 81 mg by mouth daily.    . budesonide (ENTOCORT EC) 3 MG 24 hr capsule Take 2 capsules (6 mg total) by mouth daily.  Marland Kitchen buPROPion (WELLBUTRIN SR) 150 MG 12 hr tablet TAKE 1 TABLET BY MOUTH 2 TIMES DAILY  . Cetirizine HCl 10 MG CAPS Take 1 capsule by mouth as needed. Reported on 08/17/2015  . escitalopram (LEXAPRO) 20 MG tablet Take 1 tablet (20 mg total) by mouth daily.  Marland Kitchen glipiZIDE (GLUCOTROL) 5 MG tablet TAKE 1 TABLET BY MOUTH ONCE A DAY  . glyBURIDE (DIABETA) 2.5 MG tablet Take 2.5 mg by mouth daily with breakfast.    . hydrochlorothiazide (MICROZIDE) 12.5 MG capsule TAKE 1 CAPSULE BY MOUTH ONCE DAILY  . imipramine (TOFRANIL) 25 MG tablet Take 1 tablet (25 mg total) by mouth 2 (two) times daily.  Marland Kitchen LORazepam (ATIVAN) 0.5 MG tablet TAKE 1 TABLET BY MOUTH TWICE A DAY  . magnesium citrate SOLN Drink 1/2 bottle then drink last half bottle six hours later.  . nitrofurantoin, macrocrystal-monohydrate, (MACROBID) 100 MG capsule Take 1 capsule (100 mg total) by mouth 2 (two) times daily.  . polyethylene glycol powder (GLYCOLAX/MIRALAX) powder Take 17 g by mouth daily.  . simvastatin (ZOCOR) 20 MG tablet Take 20 mg by mouth at bedtime. Reported on 08/17/2015  . cephALEXin (KEFLEX) 500 MG capsule Take 1 capsule (500 mg total) by mouth 3 (three) times daily. (Patient not taking: Reported on 11/28/2015)   No facility-administered encounter medications on file as of 11/28/2015.     Allergies  Allergen Reactions  . Ace Inhibitors     cough  . Ciprofloxacin Hcl   . Quinolones Other (See Comments)    Tendon rupture    Review of Systems  Constitutional: Positive for malaise/fatigue.  Eyes: Negative.   Respiratory: Positive for shortness of breath (chronic).   Cardiovascular: Negative.   Gastrointestinal: Negative.   Genitourinary: Negative.   Musculoskeletal: Positive for joint pain and myalgias.        Unable to stand up, using a wheelchair.  Skin: Negative.   Neurological: Positive for focal weakness and weakness.       Patient states that she is has great difficulty with weightbearing.  Endo/Heme/Allergies: Negative.   Psychiatric/Behavioral: Negative.    Objective:  BP 128/62   Pulse 82   Temp 97.7 F (36.5 C)   Resp 16   Physical Exam  Constitutional: She is well-developed, well-nourished, and in no distress.  Pleasant, cooperative, morbidly obese white female sitting in a wheelchair.  HENT:  Head: Normocephalic and atraumatic.  Right Ear: External ear normal.  Left Ear: External ear normal.  Nose: Nose normal.  Eyes: Conjunctivae are normal. No scleral icterus.  Neck: No thyromegaly present.  Cardiovascular: Normal rate, regular rhythm, normal heart sounds and intact distal pulses.  No murmur heard. Pulmonary/Chest: Effort normal and breath sounds normal. No respiratory distress. She has no wheezes.  Abdominal: Soft.  Lymphadenopathy:    She has no cervical adenopathy.  Neurological: She is alert. She displays weakness. No cranial nerve deficit. Coordination and gait abnormal.  Using a wheelchair  Skin: Skin is warm and dry.  Psychiatric: Mood, memory, affect and judgment normal.    Assessment and Plan :  1. Type 2 diabetes mellitus without complication, without long-term current use of insulin (HCC) - HgB A1c  2. Benign hypertension - Comprehensive metabolic panel  3. Need for influenza vaccination - Flu vaccine HIGH DOSE PF (Fluzone High dose)  4. Gait difficulty - B12 This is a ongoing problem in this patient who is new to me in the past couple of months. Evaluation has already begun with referral. I think morbid obesity and sedentary lifestyle certainly contribute to this. Rule out other issues with the referral that she already has has appointment for.  5. Hypercholesteremia - Lipid Panel With LDL/HDL Ratio I would consider stopping Zocor again due to  the complaint of lower extremity weakness. 6. Other depression  7. Neuropathy (HCC) - B12 8. Morbid obesity HPI, Exam and A&P transcribed under direction and in the presence of Miguel Aschoff, MD. I have done the exam and reviewed the chart and it is accurate to the best of my knowledge. Miguel Aschoff M.D. [facility]

## 2015-12-03 LAB — LIPID PANEL WITH LDL/HDL RATIO
Cholesterol, Total: 230 mg/dL — ABNORMAL HIGH (ref 100–199)
HDL: 45 mg/dL (ref >39)
LDL Calculated: 150 mg/dL — ABNORMAL HIGH (ref 0–99)
LDl/HDL Ratio: 3.3 ratio units — ABNORMAL HIGH (ref 0.0–3.2)
Triglycerides: 175 mg/dL — ABNORMAL HIGH (ref 0–149)
VLDL Cholesterol Cal: 35 mg/dL (ref 5–40)

## 2015-12-03 LAB — HEMOGLOBIN A1C
ESTIMATED AVERAGE GLUCOSE: 117 mg/dL
HEMOGLOBIN A1C: 5.7 % — AB (ref 4.8–5.6)

## 2015-12-03 LAB — COMPREHENSIVE METABOLIC PANEL
ALK PHOS: 70 IU/L (ref 39–117)
ALT: 11 IU/L (ref 0–32)
AST: 10 IU/L (ref 0–40)
Albumin/Globulin Ratio: 1.3 (ref 1.2–2.2)
Albumin: 3.6 g/dL (ref 3.5–4.7)
BUN/Creatinine Ratio: 13 (ref 12–28)
BUN: 16 mg/dL (ref 8–27)
Bilirubin Total: 0.4 mg/dL (ref 0.0–1.2)
CO2: 30 mmol/L — AB (ref 18–29)
CREATININE: 1.25 mg/dL — AB (ref 0.57–1.00)
Calcium: 9.6 mg/dL (ref 8.7–10.3)
Chloride: 97 mmol/L (ref 96–106)
GFR calc Af Amer: 46 mL/min/{1.73_m2} — ABNORMAL LOW (ref >59)
GFR calc non Af Amer: 40 mL/min/{1.73_m2} — ABNORMAL LOW (ref >59)
GLUCOSE: 104 mg/dL — AB (ref 65–99)
Globulin, Total: 2.7 g/dL (ref 1.5–4.5)
Potassium: 3.9 mmol/L (ref 3.5–5.2)
SODIUM: 144 mmol/L (ref 134–144)
Total Protein: 6.3 g/dL (ref 6.0–8.5)

## 2015-12-03 LAB — VITAMIN B12: VITAMIN B 12: 374 pg/mL (ref 211–946)

## 2015-12-05 ENCOUNTER — Other Ambulatory Visit: Payer: Self-pay | Admitting: Family Medicine

## 2015-12-05 DIAGNOSIS — I10 Essential (primary) hypertension: Secondary | ICD-10-CM

## 2015-12-13 ENCOUNTER — Other Ambulatory Visit: Payer: Self-pay | Admitting: Family Medicine

## 2015-12-13 ENCOUNTER — Emergency Department: Payer: Medicare Other

## 2015-12-13 ENCOUNTER — Emergency Department
Admission: EM | Admit: 2015-12-13 | Discharge: 2015-12-14 | Disposition: A | Discharge status: Home / Self Care | Payer: Medicare Other | Attending: Student in an Organized Health Care Education/Training Program | Admitting: Student in an Organized Health Care Education/Training Program

## 2015-12-13 ENCOUNTER — Encounter: Payer: Self-pay | Admitting: Occupational Medicine

## 2015-12-13 DIAGNOSIS — S301XXA Contusion of abdominal wall, initial encounter: Secondary | ICD-10-CM | POA: Insufficient documentation

## 2015-12-13 DIAGNOSIS — Z79899 Other long term (current) drug therapy: Secondary | ICD-10-CM | POA: Diagnosis not present

## 2015-12-13 DIAGNOSIS — I129 Hypertensive chronic kidney disease with stage 1 through stage 4 chronic kidney disease, or unspecified chronic kidney disease: Secondary | ICD-10-CM | POA: Diagnosis not present

## 2015-12-13 DIAGNOSIS — W1839XA Other fall on same level, initial encounter: Secondary | ICD-10-CM | POA: Insufficient documentation

## 2015-12-13 DIAGNOSIS — J309 Allergic rhinitis, unspecified: Secondary | ICD-10-CM

## 2015-12-13 DIAGNOSIS — S0083XA Contusion of other part of head, initial encounter: Secondary | ICD-10-CM | POA: Insufficient documentation

## 2015-12-13 DIAGNOSIS — Y999 Unspecified external cause status: Secondary | ICD-10-CM | POA: Insufficient documentation

## 2015-12-13 DIAGNOSIS — E039 Hypothyroidism, unspecified: Secondary | ICD-10-CM | POA: Insufficient documentation

## 2015-12-13 DIAGNOSIS — W19XXXA Unspecified fall, initial encounter: Secondary | ICD-10-CM

## 2015-12-13 DIAGNOSIS — S20219A Contusion of unspecified front wall of thorax, initial encounter: Secondary | ICD-10-CM | POA: Diagnosis not present

## 2015-12-13 DIAGNOSIS — Y9301 Activity, walking, marching and hiking: Secondary | ICD-10-CM | POA: Insufficient documentation

## 2015-12-13 DIAGNOSIS — S82831A Other fracture of upper and lower end of right fibula, initial encounter for closed fracture: Secondary | ICD-10-CM | POA: Diagnosis not present

## 2015-12-13 DIAGNOSIS — Z7984 Long term (current) use of oral hypoglycemic drugs: Secondary | ICD-10-CM | POA: Insufficient documentation

## 2015-12-13 DIAGNOSIS — Y92481 Parking lot as the place of occurrence of the external cause: Secondary | ICD-10-CM | POA: Diagnosis not present

## 2015-12-13 DIAGNOSIS — N183 Chronic kidney disease, stage 3 (moderate): Secondary | ICD-10-CM | POA: Insufficient documentation

## 2015-12-13 DIAGNOSIS — S0990XA Unspecified injury of head, initial encounter: Secondary | ICD-10-CM | POA: Diagnosis present

## 2015-12-13 DIAGNOSIS — E1122 Type 2 diabetes mellitus with diabetic chronic kidney disease: Secondary | ICD-10-CM | POA: Diagnosis not present

## 2015-12-13 LAB — CBC WITH DIFFERENTIAL/PLATELET
Basophils Absolute: 0.1 10*3/uL (ref 0–0.1)
Basophils Relative: 1 %
Eosinophils Absolute: 0.1 10*3/uL (ref 0–0.7)
Eosinophils Relative: 1 %
HEMATOCRIT: 40.6 % (ref 35.0–47.0)
HEMOGLOBIN: 13.2 g/dL (ref 12.0–16.0)
LYMPHS ABS: 0.7 10*3/uL — AB (ref 1.0–3.6)
LYMPHS PCT: 5 %
MCH: 27.9 pg (ref 26.0–34.0)
MCHC: 32.5 g/dL (ref 32.0–36.0)
MCV: 85.6 fL (ref 80.0–100.0)
MONOS PCT: 4 %
Monocytes Absolute: 0.5 10*3/uL (ref 0.2–0.9)
NEUTROS ABS: 13 10*3/uL — AB (ref 1.4–6.5)
NEUTROS PCT: 89 %
Platelets: 212 10*3/uL (ref 150–440)
RBC: 4.75 MIL/uL (ref 3.80–5.20)
RDW: 16.3 % — ABNORMAL HIGH (ref 11.5–14.5)
WBC: 14.5 10*3/uL — ABNORMAL HIGH (ref 3.6–11.0)

## 2015-12-13 MED ORDER — PROMETHAZINE HCL 25 MG/ML IJ SOLN: 12.5000 mg | Freq: Once | INTRAMUSCULAR | Status: DC

## 2015-12-13 MED ORDER — FENTANYL CITRATE (PF) 100 MCG/2ML IJ SOLN
100.0000 ug | INTRAMUSCULAR | Status: DC | PRN
  Administered 2015-12-13: 100 ug via INTRAVENOUS
  Filled 2015-12-13: qty 2

## 2015-12-13 NOTE — ED Triage Notes (Addendum)
Pt present via ems from home with right leg pain foot pain just hurts  Cant walk on it since fall today at 4pm getting into the car. Mechanial fall multiple falls since dec. [facility] called at 4pm [facility] to [facility].  Pt refused to to go to hosptial. Now pt called EMS to come to because right leg hurting. Pt denies hitting head. No CP no SHOB. Per husband pt slipped on getting into the car husband states lowered to the ground. Per husband fell 16times in the last 59month. No deformity noted at this time. Pt denies Hip pain.

## 2015-12-13 NOTE — ED Notes (Signed)
Patient transported to X-ray 

## 2015-12-13 NOTE — ED Provider Notes (Addendum)
[facility] Emergency Department Provider Note    First MD Initiated Contact with Patient 12/13/15 2330     (approximate)  I have reviewed the triage vital signs and the nursing notes.   HISTORY  Chief Complaint Fall and Leg Pain (right lower)    HPI Deborah [name] is a 82 y.o. female who presents with her current falls from standing for the past several weeks. States that she had a mechanical fall today while walking in the parking lot to get in the car. This occurred right around 4 Pm.  Signs of sudden onset right leg pain particularly in the hip radiating down to the foot. No numbness or tingling. Has scattered bruising across abdomen and chest from recurrent falls. Denies any upper extremity pain. Denies any LOC today. Denies any chest pain or shortness of breath. He currently rates her pain as 910 in severity and describes it as cramping in nature.   Past Medical History:  Diagnosis Date  . Anxiety   . Chronic kidney disease, stage III (moderate)   . Contracture of palmar fascia   . Cough   . Diabetes mellitus   . Hyperlipidemia   . Hypertension   . Obesity   . Osteoarthritis   . Other specified acquired hypothyroidism   . Otitis externa   . Palpitations   . Peripheral neuropathy ([place])   . Pure hypercholesterolemia   . Secondary hyperparathyroidism (of renal origin)   . Sleep apnea   . Unspecified adjustment reaction   . Unspecified hereditary and idiopathic peripheral neuropathy   . Unspecified urinary incontinence     Patient Active Problem List   Diagnosis Date Noted  . Memory loss 07/18/2015  . Depression 01/12/2015  . Shoulder pain 12/17/2014  . Pain in shoulder 08/05/2014  . Gonalgia 07/21/2014  . History of knee surgery 07/21/2014  . Allergic rhinitis 07/16/2014  . Anxiety 07/16/2014  . Benign hypertension 07/16/2014  . Alteration in bowel elimination: incontinence 07/16/2014  . Chronic kidney disease (CKD), stage III  (moderate) 07/16/2014  . Colitis 07/16/2014  . CN (constipation) 07/16/2014  . D (diarrhea) 07/16/2014  . Contracture of palmar fascia (Dupuytren's) 07/16/2014  . Hypercholesteremia 07/16/2014  . Hypoxia 07/16/2014  . Absence of bladder continence 07/16/2014  . Focal lymphocytic colitis 07/16/2014  . Extreme obesity ([place]) 07/16/2014  . Arthritis, degenerative 07/16/2014  . Disorder of peripheral nervous system ([place]) 07/16/2014  . Neuralgia neuritis, sciatic nerve 07/16/2014  . Hyperparathyroidism, secondary ([place]) 07/16/2014  . Apnea, sleep 07/16/2014  . Subclinical hypothyroidism 07/16/2014  . Diabetes mellitus, type 2 ([place]) 07/16/2014  . 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 Urinary incontinence without sensory awareness 09/03/2012  . Palpitations 12/28/2010  . SOB (shortness of breath) 12/28/2010  . Overweight 08/02/2009    Past Surgical History:  Procedure Laterality Date  . BACK SURGERY  1990   Ruptured Disc.  Marland Kitchen CARDIAC CATHETERIZATION  03/2008  . COLONOSCOPY    . HAND SURGERY Right   . REPLACEMENT TOTAL KNEE Bilateral   . SHOULDER SURGERY Right 06/2007    Prior to Admission medications   Medication Sig Start Date End Date Taking? Authorizing Provider  acetaminophen (TYLENOL) 500 MG tablet Take 500 mg by mouth every 6 (six) hours as needed.    Historical Provider, MD  amLODipine (NORVASC) 5 MG tablet TAKE 2 TABLETS BY MOUTH ONCE DAILY 12/06/15   Jerrol Banana., MD  aspirin EC 325 MG tablet Take 1 tablet (325 mg total) by  mouth daily. 12/14/15 01/04/16  Merlyn Lot, MD  budesonide (ENTOCORT EC) 3 MG 24 hr capsule Take 2 capsules (6 mg total) by mouth daily. 07/05/15   Margarita Rana, MD  buPROPion [place] [facility] SR) 150 MG 12 hr tablet TAKE 1 TABLET BY MOUTH 2 TIMES DAILY 07/25/15   Margarita Rana, MD  cephALEXin (KEFLEX) 500 MG capsule Take 1 capsule (500 mg total) by mouth 3 (three) times daily. Patient not taking: Reported on 11/28/2015 11/06/15   Paulette Blanch, MD    cephALEXin (KEFLEX) 500 MG capsule Take 1 capsule (500 mg total) by mouth 3 (three) times daily. 12/14/15 12/21/15  Merlyn Lot, MD  Cetirizine HCl 10 MG CAPS Take 1 capsule by mouth as needed. Reported on 08/17/2015 06/15/13   Historical Provider, MD  escitalopram (LEXAPRO) 20 MG tablet Take 1 tablet (20 mg total) by mouth daily. 12/17/14   Margarita Rana, MD  glipiZIDE (GLUCOTROL) 5 MG tablet TAKE 1 TABLET BY MOUTH ONCE A DAY 07/25/15   Margarita Rana, MD  glyBURIDE (DIABETA) 2.5 MG tablet Take 2.5 mg by mouth daily with breakfast.      Historical Provider, MD  hydrochlorothiazide (MICROZIDE) 12.5 MG capsule TAKE 1 CAPSULE BY MOUTH ONCE DAILY 06/24/15   Margarita Rana, MD  imipramine (TOFRANIL) 25 MG tablet Take 1 tablet (25 mg total) by mouth 2 (two) times daily. 10/07/15   Richard Maceo Pro., MD  LORazepam (ATIVAN) 0.5 MG tablet TAKE 1 TABLET BY MOUTH TWICE A DAY 01/31/15   Margarita Rana, MD  magnesium citrate SOLN Drink 1/2 bottle then drink last half bottle six hours later. 05/31/15   Margarita Rana, MD  nitrofurantoin, macrocrystal-monohydrate, (MACROBID) 100 MG capsule Take 1 capsule (100 mg total) by mouth 2 (two) times daily. 10/18/15   Richard Maceo Pro., MD  polyethylene glycol powder (GLYCOLAX/MIRALAX) powder Take 17 g by mouth daily. 05/31/15   Margarita Rana, MD  simvastatin (ZOCOR) 20 MG tablet Take 20 mg by mouth at bedtime. Reported on 08/17/2015 07/18/15   Margarita Rana, MD    Allergies Ace inhibitors; Ciprofloxacin hcl; and Quinolones  Family History  Problem Relation Age of Onset  . Heart attack Father   . Hypertension Sister   . Hypertension Sister   . Alzheimer's disease Mother     Social History Social History  Substance Use Topics  . Smoking status: Former Smoker    Packs/day: 10.00    Years: 1.00    Types: Cigarettes    Quit date: 12/27/1997  . Smokeless tobacco: Never Used  . Alcohol use No    Review of Systems Patient denies headaches, rhinorrhea, blurry  vision, numbness, shortness of breath, chest pain, edema, cough, abdominal pain, nausea, vomiting, diarrhea, dysuria, fevers, rashes or hallucinations unless otherwise stated above in HPI. ____________________________________________   PHYSICAL EXAM:  VITAL SIGNS: Vitals:   12/14/15 0400 12/14/15 0455  BP: (!) 150/63 135/78  Pulse: 82 84  Resp: 18 18  Temp:  98 F (36.7 C)    Constitutional: Alert and oriented. No acute distress Eyes: Conjunctivae are normal. PERRL. EOMI. Head: small area of ecchymosis to left forehead, no deformity noted Nose: No congestion/rhinnorhea. Mouth/Throat: Mucous membranes are moist.  Oropharynx non-erythematous. Neck: No stridor. Painless ROM. No cervical spine tenderness to palpation Hematological/Lymphatic/Immunilogical: No cervical lymphadenopathy. Cardiovascular: Normal rate, regular rhythm. Grossly normal heart sounds.  Good peripheral circulation. Respiratory: Normal respiratory effort.  No retractions. Lungs CTAB. Gastrointestinal: obese, Soft and nontender. No distention. No abdominal bruits. No CVA tenderness.  Musculoskeletal: RLE ttp diffusely,  nor edema.  No joint effusions. Neurologic:  Normal speech and language. No gross focal neurologic deficits are appreciated. No gait instability. Skin:  Skin is warm, dry and intact. Scattered ecchymosis diffusesely of varying ages Psychiatric: Mood and affect are normal. Speech and behavior are normal.  ____________________________________________   LABS (all labs ordered are listed, but only abnormal results are displayed)  Results for orders placed or performed during the hospital encounter of 12/13/15 (from the past 24 hour(s))  CBC with Differential/Platelet     Status: Abnormal   Collection Time: 12/13/15 11:42 PM  Result Value Ref Range   WBC 14.5 (H) 3.6 - 11.0 K/uL   RBC 4.75 3.80 - 5.20 MIL/uL   Hemoglobin 13.2 12.0 - 16.0 g/dL   HCT 40.6 35.0 - 47.0 %   MCV 85.6 80.0 - 100.0 fL    MCH 27.9 26.0 - 34.0 pg   MCHC 32.5 32.0 - 36.0 g/dL   RDW 16.3 (H) 11.5 - 14.5 %   Platelets 212 150 - 440 K/uL   Neutrophils Relative % 89 %   Neutro Abs 13.0 (H) 1.4 - 6.5 K/uL   Lymphocytes Relative 5 %   Lymphs Abs 0.7 (L) 1.0 - 3.6 K/uL   Monocytes Relative 4 %   Monocytes Absolute 0.5 0.2 - 0.9 K/uL   Eosinophils Relative 1 %   Eosinophils Absolute 0.1 0 - 0.7 K/uL   Basophils Relative 1 %   Basophils Absolute 0.1 0 - 0.1 K/uL  Troponin I     Status: None   Collection Time: 12/13/15 11:42 PM  Result Value Ref Range   Troponin I <0.03 <0.03 ng/mL  Comprehensive metabolic panel     Status: Abnormal   Collection Time: 12/13/15 11:42 PM  Result Value Ref Range   Sodium 137 135 - 145 mmol/L   Potassium 3.2 (L) 3.5 - 5.1 mmol/L   Chloride 97 (L) 101 - 111 mmol/L   CO2 30 22 - 32 mmol/L   Glucose, Bld 145 (H) 65 - 99 mg/dL   BUN 23 (H) 6 - 20 mg/dL   Creatinine, Ser 1.50 (H) 0.44 - 1.00 mg/dL   Calcium 9.2 8.9 - 10.3 mg/dL   Total Protein 6.7 6.5 - 8.1 g/dL   Albumin 3.6 3.5 - 5.0 g/dL   AST 22 15 - 41 U/L   ALT 16 14 - 54 U/L   Alkaline Phosphatase 61 38 - 126 U/L   Total Bilirubin 0.5 0.3 - 1.2 mg/dL   GFR calc non Af Amer 31 (L) >60 mL/min   GFR calc Af Amer 36 (L) >60 mL/min   Anion gap 10 5 - 15  Urinalysis complete, with microscopic ([facility] only)     Status: Abnormal   Collection Time: 12/14/15  3:41 AM  Result Value Ref Range   Color, Urine YELLOW (A) YELLOW   APPearance HAZY (A) CLEAR   Glucose, UA NEGATIVE NEGATIVE mg/dL   Bilirubin Urine NEGATIVE NEGATIVE   Ketones, ur NEGATIVE NEGATIVE mg/dL   Specific Gravity, Urine 1.017 1.005 - 1.030   Hgb urine dipstick NEGATIVE NEGATIVE   pH 6.0 5.0 - 8.0   Protein, ur NEGATIVE NEGATIVE mg/dL   Nitrite NEGATIVE NEGATIVE   Leukocytes, UA NEGATIVE NEGATIVE   RBC / HPF 0-5 0 - 5 RBC/hpf   WBC, UA 0-5 0 - 5 WBC/hpf   Bacteria, UA MANY (A) NONE SEEN   Squamous Epithelial / LPF 0-5 (A) NONE SEEN  Mucous PRESENT     Hyaline Casts, UA PRESENT    ____________________________________________  EKG My review and personal interpretation at Time: 23:28   Indication: fall  Rate: 85  Rhythm: sinus Axis: normal Other: nonspecific st changes, no acute ischemia ____________________________________________  RADIOLOGY  I personally reviewed all radiographic images ordered to evaluate for the above acute complaints and reviewed radiology reports and findings.  These findings were personally discussed with the patient.  Please see medical record for radiology report. ____________________________________________   PROCEDURES  Procedure(s) performed: yes ORTHOPEDIC INJURY TREATMENT Date/Time: 12/14/2015 1:41 PM Performed by: Merlyn Lot Authorized by: Merlyn Lot  Consent: Verbal consent obtained. Consent given by: patient Injury location: lower leg Injury type: fracture Fracture type: proximal fibula Pre-procedure distal perfusion: normal Pre-procedure neurological function: normal Pre-procedure range of motion: normal Immobilization: splint Splint type: short leg Supplies used: Ortho-Glass Post-procedure neurovascular assessment: post-procedure neurovascularly intact        Critical Care performed: no ____________________________________________   INITIAL IMPRESSION / ASSESSMENT AND PLAN / ED COURSE  Pertinent labs & imaging results that were available during my care of the patient were reviewed by me and considered in my medical decision making (see chart for details).  DDX: fracture, contusion, sprain, dislocation, sah, sdh  Deborah [name] is a 82 y.o. who presents to the ED with history of recurrent falls for over 5-6 months. Patient arrives afebrile and hemodynamically stable. Does have evidence of head trauma status post fall today. It sounds like a mechanical fall based on history. Does have complaint of right lower extremity pain as described above. We'll order laboratory  evaluation as well as radiographic examination to evaluate for acute traumatic injury.  EKG without dysrhythmia or ischemia.  The patient will be placed on continuous pulse oximetry and telemetry for monitoring.  Laboratory evaluation will be sent to evaluate for the above complaints.     Clinical Course  Comment By Time  Imaging shows evidence of right fibular neck nondisplaced fracture. No distal fracture no evidence of hip fracture. CT imaging otherwise unremarkable. Chest x-ray reassuring. Have evidence of leukocytosis but is afebrile. This is likely posttraumatic given her recent fall.  Matter blood work is otherwise reassuring as well. Currently awaiting urinalysis. Merlyn Lot, MD 11/01 [phone]  Evidence of bacturia.  Will treat with keflex.  Patient remains HDS.  Have offered admission to hospital for pain control and PT consult but the patient and husband decline.  They've recently retrofit either home the past several months including grams, powered wheelchair and other adjustments for him to make them more amenable to the patient's difficulty with ambulation. I will provide patient with prescription for 4 days aspirin for DVT prophylaxis. Will arrange follow-up with PCP as well as orthopedics. Have discussed with the patient and available family all diagnostics and treatments performed thus far and all questions were answered to the best of my ability. The patient demonstrates understanding and agreement with plan.  Merlyn Lot, MD 11/01 0413     ____________________________________________   FINAL CLINICAL IMPRESSION(S) / ED DIAGNOSES  Final diagnoses:  Closed fracture of proximal end of right fibula, unspecified fracture morphology, initial encounter  Fall, initial encounter      NEW MEDICATIONS STARTED DURING THIS VISIT:  Discharge Medication List as of 12/14/2015  4:17 AM    START taking these medications   Details  aspirin EC 325 MG tablet Take 1 tablet (325 mg  total) by mouth daily., Starting Wed 12/14/2015, Until Wed 01/04/2016, Print    !!  cephALEXin (KEFLEX) 500 MG capsule Take 1 capsule (500 mg total) by mouth 3 (three) times daily., Starting Wed 12/14/2015, Until Wed 12/21/2015, Print     !! - Potential duplicate medications found. Please discuss with provider.       Note:  This document was prepared using Dragon voice recognition software and may include unintentional dictation errors.    Merlyn Lot, MD 12/14/15 0051    Merlyn Lot, MD 12/14/15 1341

## 2015-12-13 NOTE — ED Notes (Signed)
Pt told not to drink water. Pt husband gave a sip to rinse mouth and spit out. Pt drank the sip of water anyways.

## 2015-12-14 LAB — URINALYSIS COMPLETE WITH MICROSCOPIC (ARMC ONLY)
BILIRUBIN URINE: NEGATIVE
Glucose, UA: NEGATIVE mg/dL
HGB URINE DIPSTICK: NEGATIVE
Ketones, ur: NEGATIVE mg/dL
Leukocytes, UA: NEGATIVE
Nitrite: NEGATIVE
PH: 6 (ref 5.0–8.0)
Protein, ur: NEGATIVE mg/dL
Specific Gravity, Urine: 1.017 (ref 1.005–1.030)

## 2015-12-14 LAB — COMPREHENSIVE METABOLIC PANEL
ALBUMIN: 3.6 g/dL (ref 3.5–5.0)
ALT: 16 U/L (ref 14–54)
ANION GAP: 10 (ref 5–15)
AST: 22 U/L (ref 15–41)
Alkaline Phosphatase: 61 U/L (ref 38–126)
BILIRUBIN TOTAL: 0.5 mg/dL (ref 0.3–1.2)
BUN: 23 mg/dL — ABNORMAL HIGH (ref 6–20)
CO2: 30 mmol/L (ref 22–32)
Calcium: 9.2 mg/dL (ref 8.9–10.3)
Chloride: 97 mmol/L — ABNORMAL LOW (ref 101–111)
Creatinine, Ser: 1.5 mg/dL — ABNORMAL HIGH (ref 0.44–1.00)
GFR calc Af Amer: 36 mL/min — ABNORMAL LOW (ref >60)
GFR calc non Af Amer: 31 mL/min — ABNORMAL LOW (ref >60)
GLUCOSE: 145 mg/dL — AB (ref 65–99)
POTASSIUM: 3.2 mmol/L — AB (ref 3.5–5.1)
Sodium: 137 mmol/L (ref 135–145)
TOTAL PROTEIN: 6.7 g/dL (ref 6.5–8.1)

## 2015-12-14 LAB — TROPONIN I: Troponin I: <0.03 ng/mL (ref <0.03)

## 2015-12-14 MED ORDER — ASPIRIN EC 325 MG PO TBEC: 325.0000 mg | DELAYED_RELEASE_TABLET | Freq: Every day | ORAL | 0 refills | Status: AC

## 2015-12-14 MED ORDER — CEPHALEXIN 500 MG PO CAPS: 500.0000 mg | ORAL_CAPSULE | Freq: Three times a day (TID) | ORAL | 0 refills | Status: AC

## 2015-12-14 NOTE — ED Notes (Signed)
Lab called to notify them that we are adding on a met c.

## 2015-12-14 NOTE — ED Notes (Signed)
Pt assisted to bathroom stand and pivot 2 person assist. Pt needed encouragement. Pt noted to urinated on self prior to using the toliet. Pt urinated and had a bowel movement in the sample hat.  Unable to send a sample at this time. MD made aware verbal order received for  in and out cath at this time. Pt given water to drink at this time. Per husband he continues to say she cant get into the wheel chair or van for discharge.

## 2015-12-14 NOTE — ED Notes (Signed)
Pt's husband out to desk reporting to this RN that he doesn't think that he will be able to take pt home himself due to issues with being able to get patient out of vehicle and into the house by himself. Husband reports that he thinks that pt will have to be taken home by EMS. This RN explained to pt's husband it was possible that insurance would not cover the cost of EMS transport back home and that it would have to be paid out of pocket. Husband asked how much that would be, this RN explained that we had no idea how much the cost of that would be. Husband verbalized understanding and went back to room.

## 2015-12-14 NOTE — ED Notes (Signed)
Discharge instructions reviewed with patient. Questions fielded by this RN. Patient verbalizes understanding of instructions. Patient discharged home in stable condition per robinson md . No acute distress noted at time of discharge.

## 2015-12-16 ENCOUNTER — Telehealth: Payer: Self-pay | Admitting: Family Medicine

## 2015-12-16 DIAGNOSIS — R2681 Unsteadiness on feet: Secondary | ICD-10-CM

## 2015-12-16 DIAGNOSIS — R531 Weakness: Secondary | ICD-10-CM

## 2015-12-16 DIAGNOSIS — IMO0001 Reserved for inherently not codable concepts without codable children: Secondary | ICD-10-CM

## 2015-12-16 DIAGNOSIS — R296 Repeated falls: Secondary | ICD-10-CM

## 2015-12-16 DIAGNOSIS — T148XXA Other injury of unspecified body region, initial encounter: Secondary | ICD-10-CM

## 2015-12-16 NOTE — Telephone Encounter (Signed)
Please review, she had another fall and ended up having a fracture.-aa

## 2015-12-16 NOTE — Telephone Encounter (Signed)
Pt 's husband called needing an order for a hospital bed.  Pt is getting a cast on 12/16/15 and will need a hospital bed for her at home.   His call back is [phone]  Thanks, Con Memos

## 2015-12-16 NOTE — Telephone Encounter (Signed)
This requires a face to face.

## 2015-12-19 ENCOUNTER — Telehealth: Payer: Self-pay

## 2015-12-19 NOTE — Telephone Encounter (Signed)
Spoke with Deborah [name] and she just wanted to make sure we were aware of this. Patient is very deconditioned with not been able to walk and basically staying in bed her muscles are no longer strong enough and that is why she is falling part of this. If patient does not get moving with help of physical therapy she discussed with family that patient then would need to be in skilled nursing facility for a while. Not safe for her to be home now and she is going to fax over everything we need to fill out to try and get patient in [facility], patient's [employer]

## 2015-12-19 NOTE — Telephone Encounter (Signed)
Spoke with patient son, Legrand Como, advised that with insurance she would need face to face, also advised him that home health would be a great option at this time to evaluate patient who is not able to move at this tim.e. They have been trying to care for patient to help with eating, bathing, hygiene and not able to move her. Order put in. Sarah please call patient's son he is on DPR and is trying to takce care of everything for the patient, his number is [phone]

## 2015-12-19 NOTE — Telephone Encounter (Addendum)
Hinton Dyer from [facility] called she states that patient is beyond in need of home health and needs [facility], she states that she had discussed with patient and husband about patient being placed in inpatient rehab. Hinton Dyer says there are two facilities that they are looking into, [facility] located in [place] and the other is [facility] located in [place].Hinton Dyer  is requesting PT/OT consults for patient along with medical social worker coming to home. Larene Beach states that patient has had 19 falls in the past 5 months and EMS has come out to home 15 times. Patient is unable to walk or get in and out of bed, Hinton Dyer states that spouse is 15yr old and unable to take care of patients basic needs. Patient has a reported hair line fracture to her right leg and is currently in a cam boot, patient leg is to be re-evaluated in 4 weeks to place cast. DHinton Dyerstates that patient is a bariatric patient and has broken three of her walkers, she states that she will be faxing over a FL2 form that she is requesting Dr. GRosanna Randyfill out. She is requesting a call back from provider or his nurse(s) when available. KW

## 2015-12-21 NOTE — Telephone Encounter (Signed)
Pt's son Legrand Como stated he was returning a call from Dr. Alben Spittle nurse. Thanks TNP

## 2015-12-21 NOTE — Telephone Encounter (Signed)
Spoke with son about patient's history and everything that needs to be done at this point for her -aa

## 2015-12-22 ENCOUNTER — Telehealth: Payer: Self-pay | Admitting: Family Medicine

## 2015-12-22 NOTE — Telephone Encounter (Signed)
Please review-aa 

## 2015-12-22 NOTE — Telephone Encounter (Signed)
Lori with [facility] called for a verbal order to have occupational therapy, 1 time a week for 4 weeks.  Pt is needing ADL retraining and safety prevention.  LY#[phone]

## 2015-12-22 NOTE — Telephone Encounter (Signed)
ok 

## 2015-12-22 NOTE — Telephone Encounter (Signed)
Cecille Rubin advised=-aa

## 2015-12-28 ENCOUNTER — Ambulatory Visit: Payer: Medicare Other | Admitting: Physician Assistant

## 2015-12-28 DIAGNOSIS — N2581 Secondary hyperparathyroidism of renal origin: Secondary | ICD-10-CM | POA: Diagnosis not present

## 2015-12-28 DIAGNOSIS — I129 Hypertensive chronic kidney disease with stage 1 through stage 4 chronic kidney disease, or unspecified chronic kidney disease: Secondary | ICD-10-CM | POA: Diagnosis not present

## 2015-12-28 DIAGNOSIS — M720 Palmar fascial fibromatosis [Dupuytren]: Secondary | ICD-10-CM | POA: Diagnosis not present

## 2015-12-28 DIAGNOSIS — R296 Repeated falls: Secondary | ICD-10-CM | POA: Diagnosis not present

## 2015-12-28 DIAGNOSIS — E1122 Type 2 diabetes mellitus with diabetic chronic kidney disease: Secondary | ICD-10-CM | POA: Diagnosis not present

## 2015-12-28 DIAGNOSIS — N183 Chronic kidney disease, stage 3 (moderate): Secondary | ICD-10-CM | POA: Diagnosis not present

## 2015-12-28 DIAGNOSIS — E1142 Type 2 diabetes mellitus with diabetic polyneuropathy: Secondary | ICD-10-CM | POA: Diagnosis not present

## 2015-12-29 ENCOUNTER — Telehealth: Payer: Self-pay

## 2015-12-29 NOTE — Telephone Encounter (Signed)
Not BP meds causing cough. Can order CXR if desired.

## 2015-12-29 NOTE — Telephone Encounter (Signed)
Update and a question for the patient. Hinton Dyer from [facility] called and states [facility] placement fell through due to insurance issues but she was bale to get in [facility] and she is been admitted tomorrow 12/30/15. I advised her that Dr Rosanna Randy wants patient to be followed by the house doctor there to manage her situation and anything she needs while she is there, that this would be better option.  Nurse wanted to mention that patient has had a dry hacking cough for 3 weeks. No cold symptoms no drainage or congestion. Her husband has been giving her Mucinex with no relief. Nurse thought maybe blood pressure medication or something else was causing this or what can she try to help this? She is on Amlodipine and HCTZ for b/p per our chart-aa

## 2016-01-02 ENCOUNTER — Encounter: Payer: Self-pay | Admitting: Internal Medicine

## 2016-01-02 ENCOUNTER — Non-Acute Institutional Stay (SKILLED_NURSING_FACILITY): Payer: Medicare Other | Admitting: Internal Medicine

## 2016-01-02 DIAGNOSIS — N183 Chronic kidney disease, stage 3 unspecified: Secondary | ICD-10-CM

## 2016-01-02 DIAGNOSIS — R2681 Unsteadiness on feet: Secondary | ICD-10-CM

## 2016-01-02 DIAGNOSIS — F329 Major depressive disorder, single episode, unspecified: Secondary | ICD-10-CM | POA: Diagnosis not present

## 2016-01-02 DIAGNOSIS — K5909 Other constipation: Secondary | ICD-10-CM | POA: Diagnosis not present

## 2016-01-02 DIAGNOSIS — E782 Mixed hyperlipidemia: Secondary | ICD-10-CM | POA: Diagnosis not present

## 2016-01-02 DIAGNOSIS — E1122 Type 2 diabetes mellitus with diabetic chronic kidney disease: Secondary | ICD-10-CM | POA: Diagnosis not present

## 2016-01-02 DIAGNOSIS — J309 Allergic rhinitis, unspecified: Secondary | ICD-10-CM

## 2016-01-02 DIAGNOSIS — K52832 Lymphocytic colitis: Secondary | ICD-10-CM | POA: Diagnosis not present

## 2016-01-02 DIAGNOSIS — S82831S Other fracture of upper and lower end of right fibula, sequela: Secondary | ICD-10-CM | POA: Diagnosis not present

## 2016-01-02 DIAGNOSIS — F411 Generalized anxiety disorder: Secondary | ICD-10-CM

## 2016-01-02 DIAGNOSIS — I10 Essential (primary) hypertension: Secondary | ICD-10-CM

## 2016-01-02 NOTE — Progress Notes (Signed)
LOCATION: [facility]  PCP: Wilhemena Durie, MD   Code Status: Full Code  Goals of care: Advanced Directive information Advanced Directives 12/13/2015  Does patient have an advance directive? No  Type of Advance Directive -  Would patient like information on creating an advanced directive? No - patient declined information       Extended Emergency Contact Information Primary Emergency Contact: Rhodus,Robert D Address: [place], [place] [address] [place] Phone: [phone] Relation: Spouse   Allergies  Allergen Reactions  . Ace Inhibitors     cough  . Ciprofloxacin Hcl   . Quinolones Other (See Comments)    Tendon rupture    Chief Complaint  Patient presents with  . New Admit To SNF    New Admission Visit     HPI:  Patient is a 82 y.o. female seen today for short term rehabilitation post ED visit on 12/13/15 post fall with closed fracture of proximal end of right fibula. She had a short leg splint placed that visit in the ED by orthopedic and was placed on antibiotic for UTI. She then went home and is here from 12/30/15 for rehabilitation. She is seen in her room with her son at bedside.   Review of Systems:  Constitutional: Negative for fever, chills, diaphoresis.  HENT: Negative for headache, congestion, nasal discharge, difficulty swallowing. Positive for sore throat. Eyes: Negative for double vision and discharge.  Respiratory: Negative for shortness of breath and wheezing. Occasional cough with phlegm.  Cardiovascular: Negative for chest pain, palpitations, leg swelling.  Gastrointestinal: Negative for heartburn, vomiting, abdominal pain, constipation. Positive for nausea. Last bowel movement was last night. Genitourinary: Negative for dysuria and flank pain.  Musculoskeletal: Negative for back pain, fall in the facility. she is a high fall risk patient.  Skin: Negative for itching, rash.  Neurological:  Negative for dizziness. Psychiatric/Behavioral: Negative for depression. She has memory loss.    Past Medical History:  Diagnosis Date  . Anxiety   . Chronic kidney disease, stage III (moderate)   . Contracture of palmar fascia   . Cough   . Diabetes mellitus   . Hyperlipidemia   . Hypertension   . Obesity   . Osteoarthritis   . Other specified acquired hypothyroidism   . Otitis externa   . Palpitations   . Peripheral neuropathy ([place])   . Pure hypercholesterolemia   . Secondary hyperparathyroidism (of renal origin)   . Sleep apnea   . Unspecified adjustment reaction   . Unspecified hereditary and idiopathic peripheral neuropathy   . Unspecified urinary incontinence    Past Surgical History:  Procedure Laterality Date  . BACK SURGERY  1990   Ruptured Disc.  [facility] CARDIAC CATHETERIZATION  03/2008  . COLONOSCOPY    . HAND SURGERY Right   . REPLACEMENT TOTAL KNEE Bilateral   . SHOULDER SURGERY Right 06/2007   Social History:   reports that she quit smoking about 18 years ago. Her smoking use included Cigarettes. She has a 10.00 pack-year smoking history. She has never used smokeless tobacco. She reports that she does not drink alcohol or use drugs.  Family History  Problem Relation Age of Onset  . Heart attack Father   . Hypertension Sister   . Hypertension Sister   . Alzheimer's disease Mother     Medications:   Medication List       Accurate as of 01/02/16  1:02 PM. Always use your most recent med list.          acetaminophen 500 MG tablet Commonly known as:  TYLENOL Take 500 mg by mouth every 6 (six) hours as needed.   amLODipine 5 MG tablet Commonly known as:  NORVASC Take 5 mg by mouth daily.   aspirin EC 325 MG tablet Take 1 tablet (325 mg total) by mouth daily.   budesonide 3 MG 24 hr capsule Commonly known as:  ENTOCORT EC Take 2 capsules (6 mg total) by mouth daily.   buPROPion 150 MG 12 hr tablet Commonly known as:  WELLBUTRIN SR TAKE 1  TABLET BY MOUTH 2 TIMES DAILY   Cetirizine HCl 10 MG Caps Take 1 capsule by mouth daily. Reported on 08/17/2015   escitalopram 20 MG tablet Commonly known as:  LEXAPRO Take 1 tablet (20 mg total) by mouth daily.   glipiZIDE 5 MG tablet Commonly known as:  GLUCOTROL TAKE 1 TABLET BY MOUTH ONCE A DAY   imipramine 25 MG tablet Commonly known as:  TOFRANIL Take 1 tablet (25 mg total) by mouth 2 (two) times daily.   LORazepam 0.5 MG tablet Commonly known as:  ATIVAN TAKE 1 TABLET BY MOUTH TWICE A DAY   montelukast 10 MG tablet Commonly known as:  SINGULAIR TAKE 1 TABLET BY MOUTH ONCE A DAY   polyethylene glycol powder powder Commonly known as:  GLYCOLAX/MIRALAX Take 17 g by mouth daily.   simvastatin 20 MG tablet Commonly known as:  ZOCOR Take 20 mg by mouth at bedtime. Reported on 08/17/2015       Immunizations: Immunization History  Administered Date(s) Administered  . Influenza, High Dose Seasonal PF 12/17/2014, 11/28/2015  . Pneumococcal Conjugate-13 11/16/2013  . Pneumococcal Polysaccharide-23 11/12/2007  . Tdap 03/10/2015  . Zoster 07/27/2010     Physical Exam:  Vitals:   01/02/16 1257  BP: 138/80  Pulse: 77  Resp: 18  Temp: 98.4 F (36.9 C)  TempSrc: Oral  SpO2: 96%  Weight: (!) 332 lb 4.8 oz (150.7 kg)  Height: 5' 9"  (1.753 m)   Body mass index is 49.07 kg/m.  General- elderly female, morbidly obese, in no acute distress Head- normocephalic, atraumatic Nose- no nasal discharge Throat- moist mucus membrane Eyes- PERRLA, EOMI, no pallor, no icterus Neck- no cervical lymphadenopathy Cardiovascular- normal s1,s2, no murmur Respiratory- bilateral decreased air movement, no wheeze, no rhonchi, no crackles, no use of accessory muscles Abdomen- bowel sounds present, soft, non tender Musculoskeletal- able to move all 4 extremities, generalized weakness Neurological- alert and oriented to place and time at present Skin- warm and dry, easy  bruising Psychiatry- normal mood and affect    Labs reviewed: Basic Metabolic Panel:  Recent Labs  11/05/15 2100 12/02/15 1241 12/13/15 2342  NA 137 144 137  K 3.1* 3.9 3.2*  CL 98* 97 97*  CO2 28 30* 30  GLUCOSE 153* 104* 145*  BUN 24* 16 23*  CREATININE 1.56* 1.25* 1.50*  CALCIUM 9.0 9.6 9.2   Liver Function Tests:  Recent Labs  12/02/15 1241 12/13/15 2342  AST 10 22  ALT 11 16  ALKPHOS 70 61  BILITOT 0.4 0.5  PROT 6.3 6.7  ALBUMIN 3.6 3.6   No results for input(s): LIPASE, AMYLASE in the last 8760 hours. No results for input(s): AMMONIA in the last 8760 hours. CBC:  Recent Labs  11/05/15 2100 12/13/15 2342  WBC 15.9* 14.5*  NEUTROABS  --  13.0*  HGB 14.5 13.2  HCT 42.1 40.6  MCV 83.7 85.6  PLT 221 212   Cardiac Enzymes:  Recent Labs  11/05/15 2100 12/13/15 2342  TROPONINI <0.03 <0.03   BNP: Invalid input(s): POCBNP CBG: No results for input(s): GLUCAP in the last 8760 hours.  Radiological Exams: Dg Chest 1 View  Result Date: 12/14/2015 CLINICAL DATA:  Status post fall, with concern for chest injury. Initial encounter. EXAM: CHEST 1 VIEW COMPARISON:  Chest radiograph performed 08/17/2015 FINDINGS: The lungs are well-aerated. Pulmonary vascularity is at the upper limits of normal. There is no evidence of focal opacification, pleural effusion or pneumothorax. The cardiomediastinal silhouette is within normal limits. No acute osseous abnormalities are seen. IMPRESSION: No acute cardiopulmonary process seen. No displaced rib fractures identified. Electronically Signed   By: Garald Balding M.D.   On: 12/14/2015 00:55   Dg Tibia/fibula Right  Result Date: 12/14/2015 CLINICAL DATA:  Status post fall with right leg and foot pain. Unable to walk due to pain. EXAM: RIGHT TIBIA AND FIBULA - 2 VIEW; RIGHT ANKLE - COMPLETE 3+ VIEW; RIGHT FOOT COMPLETE - 3+ VIEW COMPARISON:  11/05/2015 knee radiographs. FINDINGS: Right tibia and fibula: Nondisplaced proximal  fibular diaphysis fracture. Total knee arthroplasty and patellar resurfacing with prosthesis. Large superior and inferior patellar enthesophytes. Moderate suprapatellar joint effusion. No lucency and bone cement interface is evident. Right ankle: No acute fracture or dislocation identified. Talar dome is intact. Ankle mortise is symmetric on these nonstress views. Large dorsal calcaneal enthesophyte. Nonspecific calcification within the Achilles tendon, probably chronic posttraumatic. Intertarsal osteoarthrosis with marginal osteophytes and fibrocystic degeneration. Small anterior ankle joint effusion. Right foot: No acute fracture or dislocation is identified. Lisfranc alignment is preserved. Metatarsus adductus varus. Osteoarthrosis of the first metatarsal-phalangeal joint and intertarsal joints with sclerosis of articular surfaces. Large dorsal calcaneal enthesophyte. Small anterior ankle joint effusion. IMPRESSION: 1. Nondisplaced proximal fibular diaphysis fracture. 2. Total knee replacement without apparent hardware related complication. Moderate knee joint effusion. 3. No acute ankle fracture or dislocation identified. Small ankle joint effusion. 4. No acute fracture or dislocation of the foot identified. Mild articular degenerative changes and metatarsus adductus varus. Electronically Signed   By: Kristine Garbe M.D.   On: 12/14/2015 01:01   Dg Ankle Complete Right  Result Date: 12/14/2015 CLINICAL DATA:  Status post fall with right leg and foot pain. Unable to walk due to pain. EXAM: RIGHT TIBIA AND FIBULA - 2 VIEW; RIGHT ANKLE - COMPLETE 3+ VIEW; RIGHT FOOT COMPLETE - 3+ VIEW COMPARISON:  11/05/2015 knee radiographs. FINDINGS: Right tibia and fibula: Nondisplaced proximal fibular diaphysis fracture. Total knee arthroplasty and patellar resurfacing with prosthesis. Large superior and inferior patellar enthesophytes. Moderate suprapatellar joint effusion. No lucency and bone cement interface is  evident. Right ankle: No acute fracture or dislocation identified. Talar dome is intact. Ankle mortise is symmetric on these nonstress views. Large dorsal calcaneal enthesophyte. Nonspecific calcification within the Achilles tendon, probably chronic posttraumatic. Intertarsal osteoarthrosis with marginal osteophytes and fibrocystic degeneration. Small anterior ankle joint effusion. Right foot: No acute fracture or dislocation is identified. Lisfranc alignment is preserved. Metatarsus adductus varus. Osteoarthrosis of the first metatarsal-phalangeal joint and intertarsal joints with sclerosis of articular surfaces. Large dorsal calcaneal enthesophyte. Small anterior ankle joint effusion. IMPRESSION: 1. Nondisplaced proximal fibular diaphysis fracture. 2. Total knee replacement without apparent hardware related complication. Moderate knee joint effusion. 3. No acute ankle fracture or dislocation identified. Small ankle joint effusion. 4. No acute fracture or dislocation of the foot identified. Mild articular degenerative changes  and metatarsus adductus varus. Electronically Signed   By: Kristine Garbe M.D.   On: 12/14/2015 01:01   Ct Head Wo Contrast  Result Date: 12/14/2015 CLINICAL DATA:  Status post fall, with left frontal bruising. Concern for head injury. Initial encounter. EXAM: CT HEAD WITHOUT CONTRAST TECHNIQUE: Contiguous axial images were obtained from the base of the skull through the vertex without intravenous contrast. COMPARISON:  CT of the head performed 11/05/2015 FINDINGS: Brain: No evidence of acute infarction, hemorrhage, hydrocephalus, extra-axial collection or mass lesion/mass effect. Prominence of the ventricles and sulci reflects mild to moderate cortical volume loss. Mild cerebellar atrophy is noted. Mild periventricular white matter change likely reflects small vessel ischemic microangiopathy. The brainstem and fourth ventricle are within normal limits. The basal ganglia are  unremarkable in appearance. The cerebral hemispheres demonstrate grossly normal gray-white differentiation. No mass effect or midline shift is seen. Vascular: No hyperdense vessel or unexpected calcification. Skull: There is no evidence of fracture; visualized osseous structures are unremarkable in appearance. Sinuses/Orbits: The visualized portions of the orbits are within normal limits. The paranasal sinuses and mastoid air cells are well-aerated. Other: No significant soft tissue abnormalities are seen. IMPRESSION: 1. No evidence of traumatic intracranial injury or fracture. 2. Mild to moderate cortical volume loss and scattered small vessel ischemic microangiopathy. Electronically Signed   By: Garald Balding M.D.   On: 12/14/2015 00:48   Dg Foot Complete Right  Result Date: 12/14/2015 CLINICAL DATA:  Status post fall with right leg and foot pain. Unable to walk due to pain. EXAM: RIGHT TIBIA AND FIBULA - 2 VIEW; RIGHT ANKLE - COMPLETE 3+ VIEW; RIGHT FOOT COMPLETE - 3+ VIEW COMPARISON:  11/05/2015 knee radiographs. FINDINGS: Right tibia and fibula: Nondisplaced proximal fibular diaphysis fracture. Total knee arthroplasty and patellar resurfacing with prosthesis. Large superior and inferior patellar enthesophytes. Moderate suprapatellar joint effusion. No lucency and bone cement interface is evident. Right ankle: No acute fracture or dislocation identified. Talar dome is intact. Ankle mortise is symmetric on these nonstress views. Large dorsal calcaneal enthesophyte. Nonspecific calcification within the Achilles tendon, probably chronic posttraumatic. Intertarsal osteoarthrosis with marginal osteophytes and fibrocystic degeneration. Small anterior ankle joint effusion. Right foot: No acute fracture or dislocation is identified. Lisfranc alignment is preserved. Metatarsus adductus varus. Osteoarthrosis of the first metatarsal-phalangeal joint and intertarsal joints with sclerosis of articular surfaces. Large  dorsal calcaneal enthesophyte. Small anterior ankle joint effusion. IMPRESSION: 1. Nondisplaced proximal fibular diaphysis fracture. 2. Total knee replacement without apparent hardware related complication. Moderate knee joint effusion. 3. No acute ankle fracture or dislocation identified. Small ankle joint effusion. 4. No acute fracture or dislocation of the foot identified. Mild articular degenerative changes and metatarsus adductus varus. Electronically Signed   By: Kristine Garbe M.D.   On: 12/14/2015 01:01   Dg Hip Unilat With Pelvis 2-3 Views Right  Result Date: 12/14/2015 CLINICAL DATA:  Status post fall, with right leg pain. Initial encounter. EXAM: DG HIP (WITH OR WITHOUT PELVIS) 2-3V RIGHT COMPARISON:  None. FINDINGS: There is no evidence of fracture or dislocation. Both femoral heads are seated normally within their respective acetabula. The proximal right femur appears intact. No significant degenerative change is appreciated. The sacroiliac joints are unremarkable in appearance. The visualized bowel gas pattern is grossly unremarkable in appearance. IMPRESSION: No evidence of fracture or dislocation. Electronically Signed   By: Garald Balding M.D.   On: 12/14/2015 00:54    Assessment/Plan  Unsteady gait Will have patient work with PT/OT as tolerated to  regain strength and restore function.  Fall precautions are in place.  Right proximal fibula fracture S/p short splint to RLE. Will make orthopedic follow up. Continue tylenol 500 mg q6h prn pain and monitor. To wear her splint/ boot when out of bed. Will have her work with physical therapy and occupational therapy team to help with gait training and muscle strengthening exercises.fall precautions. Skin care. Encourage to be out of bed. Continue aspirin but change to ec 325 mg daily for dvt prophylaxis. Monitor cbc  Dm type 2 Lab Results  Component Value Date   HGBA1C 5.7 (H) 12/02/2015   Reviewed a1c. Currently on glipizide  5 mg daily. Monitor cbg bid.  Hypertension Monitor BP reading. Continue norvasc 5 mg daily. Was on hctz 12.5 mg daily that she was taking at home. BP stable. This med was discontinued on admission here. Given her ckd stage 3 monitor for now.   Chronic depression Get psychiatry consult. Continue bupropion 150 mg bid, imipramine 25 mg bid and escitalopram 20 mg daily  Allergic rhinitis Stable, continue cetirizine 10 mg but change to qd prn as per OV note from 10/16 instead of daily. Discontinue singulair as pt has not been taking it at home.  Memory loss Provide supportive care. SLP to evaluate for aspiration risk. PT and OT to work with her.   GAD On lorazepam 0.5 mg bid  Chronic constipation Continue daily miralax  HLD Lipid Panel     Component Value Date/Time   CHOL 230 (H) 12/02/2015 1241   TRIG 175 (H) 12/02/2015 1241   TRIG 217 05/21/2007   HDL 45 12/02/2015 1241   CHOLHDL 7.0 04/09/2008 0126   VLDL 56 (H) 04/09/2008 0126   LDLCALC 150 (H) 12/02/2015 1241   Reviewed lipid. Continue simvastatin 20 mg daily. Check LFT  Focal lymphocytic colitis Continue her budesonide  ckd stage 3 Monitor bmp    Goals of care: short term rehabilitation   Labs/tests ordered: cbc, cmp  Family/ staff Communication: reviewed care plan with patient, her family and nursing supervisor    Blanchie Serve, MD Internal Medicine [place], [place] [address] Cell Phone (Monday-Friday 8 am - 5 pm): [phone] On Call: [phone] and follow prompts after 5 pm and on weekends Office Phone: [phone] Office Fax: [phone]

## 2016-01-03 LAB — BASIC METABOLIC PANEL
BUN: 19 mg/dL (ref 4–21)
Creatinine: 1.4 mg/dL — AB (ref 0.5–1.1)
Glucose: 107 mg/dL
POTASSIUM: 3.6 mmol/L (ref 3.4–5.3)
SODIUM: 147 mmol/L (ref 137–147)

## 2016-01-03 LAB — HEPATIC FUNCTION PANEL
ALK PHOS: 70 U/L (ref 25–125)
ALT: 10 U/L (ref 7–35)
AST: 10 U/L — AB (ref 13–35)

## 2016-01-03 LAB — CBC AND DIFFERENTIAL
HEMATOCRIT: 38 % (ref 36–46)
HEMOGLOBIN: 11.9 g/dL — AB (ref 12.0–16.0)
Platelets: 182 10*3/uL (ref 150–399)
WBC: 7.4 10^3/mL

## 2016-01-04 ENCOUNTER — Non-Acute Institutional Stay (SKILLED_NURSING_FACILITY): Payer: Medicare Other | Admitting: Family

## 2016-01-04 DIAGNOSIS — R296 Repeated falls: Secondary | ICD-10-CM

## 2016-01-04 DIAGNOSIS — E870 Hyperosmolality and hypernatremia: Secondary | ICD-10-CM | POA: Diagnosis not present

## 2016-01-04 DIAGNOSIS — R2681 Unsteadiness on feet: Secondary | ICD-10-CM | POA: Diagnosis not present

## 2016-01-04 DIAGNOSIS — E440 Moderate protein-calorie malnutrition: Secondary | ICD-10-CM

## 2016-01-04 NOTE — Progress Notes (Signed)
Location:  [facility] Room Number: [phone] Place of Service:  SNF [phone]) Provider:  Angella Montas FNP-C   Wilhemena Durie, MD  Patient Care Team: Jerrol Banana., MD as PCP - General (Family Medicine)  Extended Emergency Contact Information Primary Emergency Contact: Poppe,Robert D Address: [address]          [place], [place] [address] [place] Phone: [phone] Relation: Spouse Secondary Emergency Contact: [place] Phone: [phone] Relation: Son  Code Status:  Full code  Goals of care: Advanced Directive information Advanced Directives 12/13/2015  Does Patient Have a Medical Advance Directive? No  Type of Advance Directive -  Would patient like information on creating a medical advance directive? No - patient declined information     Chief Complaint  Patient presents with  . Acute Visit    abnormal lab results and follow up fall     HPI:  Pt is a 82 y.o. female seen today at [facility] for an acute visit for evaluation of abnormal lab results and follow up fall episode. She is seen in her room today with Husband at bedside. She states slid off the bed at night trying to sit at side of the bed. She denies any injuries. She denies any acute pain, fever or chills. Patient's Husband states patient's Home health Nurse Hinton Dyer  [phone] was suppose to deliver new supplies for C-Pap machine also states patient needs sleep study " it's been several years since she had a sleep study.  Facility Social worker notified to follow up with Home health Agency to inquire on C-Pap supplies. Will plan to schedule an outpatient sleep study for patient upon discharge from Rehab then follow up with PCP. Her recent lab results showed Na 147, CR 1.35, TP 5.6, Alb 3.38 ( 01/03/2016).    Past Medical History:  Diagnosis Date  . Anxiety   . Chronic kidney disease, stage III  (moderate)   . Contracture of palmar fascia   . Cough   . Diabetes mellitus   . Hyperlipidemia   . Hypertension   . Obesity   . Osteoarthritis   . Other specified acquired hypothyroidism   . Otitis externa   . Palpitations   . Peripheral neuropathy ([place])   . Pure hypercholesterolemia   . Secondary hyperparathyroidism (of renal origin)   . Sleep apnea   . Unspecified adjustment reaction   . Unspecified hereditary and idiopathic peripheral neuropathy   . Unspecified urinary incontinence    Past Surgical History:  Procedure Laterality Date  . BACK SURGERY  1990   Ruptured Disc.  Marland Kitchen CARDIAC CATHETERIZATION  03/2008  . COLONOSCOPY    . HAND SURGERY Right   . REPLACEMENT TOTAL KNEE Bilateral   . SHOULDER SURGERY Right 06/2007    Allergies  Allergen Reactions  . Ace Inhibitors     cough  . Ciprofloxacin Hcl   . Quinolones Other (See Comments)    Tendon rupture      Medication List       Accurate as of 01/04/16  5:40 PM. Always use your most recent med list.          acetaminophen 500 MG tablet Commonly known as:  TYLENOL Take 500 mg by mouth every 6 (six) hours as needed.   amLODipine 5 MG tablet Commonly known as:  NORVASC Take 5 mg by mouth daily.   aspirin EC  325 MG tablet Take 1 tablet (325 mg total) by mouth daily.   budesonide 3 MG 24 hr capsule Commonly known as:  ENTOCORT EC Take 2 capsules (6 mg total) by mouth daily.   buPROPion 150 MG 12 hr tablet Commonly known as:  WELLBUTRIN SR TAKE 1 TABLET BY MOUTH 2 TIMES DAILY   Cetirizine HCl 10 MG Caps Take 1 capsule by mouth daily. Reported on 08/17/2015   escitalopram 20 MG tablet Commonly known as:  LEXAPRO Take 1 tablet (20 mg total) by mouth daily.   glipiZIDE 5 MG tablet Commonly known as:  GLUCOTROL TAKE 1 TABLET BY MOUTH ONCE A DAY   imipramine 25 MG tablet Commonly known as:  TOFRANIL Take 1 tablet (25 mg total) by mouth 2 (two) times daily.   LORazepam 0.5 MG tablet Commonly known  as:  ATIVAN TAKE 1 TABLET BY MOUTH TWICE A DAY   montelukast 10 MG tablet Commonly known as:  SINGULAIR TAKE 1 TABLET BY MOUTH ONCE A DAY   polyethylene glycol powder powder Commonly known as:  GLYCOLAX/MIRALAX Take 17 g by mouth daily.   simvastatin 20 MG tablet Commonly known as:  ZOCOR Take 20 mg by mouth at bedtime. Reported on 08/17/2015       Review of Systems  Constitutional: Negative for activity change, appetite change, chills, fatigue and fever.  HENT: Negative for congestion, rhinorrhea, sinus pain, sinus pressure, sneezing and sore throat.   Eyes: Negative.   Respiratory: Negative for cough, choking, chest tightness, shortness of breath and wheezing.   Cardiovascular: Negative for chest pain, palpitations and leg swelling.  Gastrointestinal: Negative for abdominal distention, abdominal pain, constipation, diarrhea, nausea and vomiting.  Endocrine: Negative.   Genitourinary: Negative for dysuria, frequency and urgency.  Musculoskeletal: Positive for gait problem.       Right leg boot   Skin: Negative for color change, pallor, rash and wound.  Neurological: Negative for dizziness, seizures, syncope, weakness, light-headedness and headaches.  Hematological: Does not bruise/bleed easily.  Psychiatric/Behavioral: Negative for agitation, confusion, hallucinations and sleep disturbance. The patient is not nervous/anxious.     Immunization History  Administered Date(s) Administered  . Influenza, High Dose Seasonal PF 12/17/2014, 11/28/2015  . Pneumococcal Conjugate-13 11/16/2013  . Pneumococcal Polysaccharide-23 11/12/2007  . Tdap 03/10/2015  . Zoster 07/27/2010   Pertinent  Health Maintenance Due  Topic Date Due  . FOOT EXAM  12/18/1940  . OPHTHALMOLOGY EXAM  12/18/1940  . URINE MICROALBUMIN  12/18/1940  . HEMOGLOBIN A1C  06/01/2016  . INFLUENZA VACCINE  Completed  . DEXA SCAN  Completed  . PNA vac Low Risk Adult  Completed   Fall Risk  05/06/2015 07/16/2014    Falls in the past year? Yes Yes  Number falls in past yr: 2 or more 1  Injury with Fall? Yes Yes  Follow up - Falls prevention discussed      Vitals:   01/04/16 1100  BP: (!) 143/64  Pulse: 84  Resp: 20  Temp: 97.2 F (36.2 C)  SpO2: 95%  Weight: (!) 331 lb 3.2 oz (150.2 kg)  Height: 5' 9"  (1.753 m)   Body mass index is 48.91 kg/m. Physical Exam  Labs reviewed:  Recent Labs  11/05/15 2100 12/02/15 1241 12/13/15 2342 01/03/16  NA 137 144 137 147  K 3.1* 3.9 3.2* 3.6  CL 98* 97 97*  --   CO2 28 30* 30  --   GLUCOSE 153* 104* 145*  --   BUN 24* 16  23* 19  CREATININE 1.56* 1.25* 1.50* 1.4*  CALCIUM 9.0 9.6 9.2  --     Recent Labs  12/02/15 1241 12/13/15 2342 01/03/16  AST 10 22 10*  ALT 11 16 10   ALKPHOS 70 61 70  BILITOT 0.4 0.5  --   PROT 6.3 6.7  --   ALBUMIN 3.6 3.6  --     Recent Labs  11/05/15 2100 12/13/15 2342 01/03/16  WBC 15.9* 14.5* 7.4  NEUTROABS  --  13.0*  --   HGB 14.5 13.2 11.9*  HCT 42.1 40.6 38  MCV 83.7 85.6  --   PLT 221 212 182   Lab Results  Component Value Date   TSH 3.700 12/20/2014   Lab Results  Component Value Date   HGBA1C 5.7 (H) 12/02/2015   Lab Results  Component Value Date   CHOL 230 (H) 12/02/2015   HDL 45 12/02/2015   LDLCALC 150 (H) 12/02/2015   TRIG 175 (H) 12/02/2015   CHOLHDL 7.0 04/09/2008    Assessment/Plan 1. Moderate protein-calorie malnutrition (HCC) TP 5.6, Alb 3.38 ( 01/03/2016). Encourage oral intake. RD consult for protein supplements. BMP in 01/11/2016.   2. Hypernatremia Na 147 ( 01/03/2016). Encourage Fluid intake. Discussed with patient and husband to increase fluid intake. BMP 01/11/2016.   3. Unsteady gait Continue with PT/OT for ROM, exercise, Gait stability and muscle strengthening. Fall and safety precautions.   4. Falling episodes Slid off the bed during the night.No injuries sustained. Discussed with patient the importance of calling for assistance instead of trying to get  up by herself. Encourage to use call button. Patient verbalized understanding. Continue to monitor.      Family/ staff Communication: Reviewed plan of care with patient, Patient's Husband and facility Nurse supervisor.   Labs/tests ordered:  CBC, BMP 01/11/2016

## 2016-01-11 LAB — CBC AND DIFFERENTIAL
HEMATOCRIT: 40 % (ref 36–46)
HEMOGLOBIN: 12.4 g/dL (ref 12.0–16.0)
Platelets: 223 10*3/uL (ref 150–399)
WBC: 10.9 10^3/mL

## 2016-01-12 ENCOUNTER — Non-Acute Institutional Stay (SKILLED_NURSING_FACILITY): Payer: Medicare Other | Admitting: Family

## 2016-01-12 DIAGNOSIS — N183 Chronic kidney disease, stage 3 unspecified: Secondary | ICD-10-CM

## 2016-01-12 DIAGNOSIS — R21 Rash and other nonspecific skin eruption: Secondary | ICD-10-CM | POA: Diagnosis not present

## 2016-01-12 DIAGNOSIS — F32A Depression, unspecified: Secondary | ICD-10-CM

## 2016-01-12 DIAGNOSIS — F419 Anxiety disorder, unspecified: Secondary | ICD-10-CM | POA: Diagnosis not present

## 2016-01-12 DIAGNOSIS — F329 Major depressive disorder, single episode, unspecified: Secondary | ICD-10-CM

## 2016-01-12 DIAGNOSIS — G4733 Obstructive sleep apnea (adult) (pediatric): Secondary | ICD-10-CM | POA: Diagnosis not present

## 2016-01-12 DIAGNOSIS — R2681 Unsteadiness on feet: Secondary | ICD-10-CM

## 2016-01-12 DIAGNOSIS — S82831D Other fracture of upper and lower end of right fibula, subsequent encounter for closed fracture with routine healing: Secondary | ICD-10-CM

## 2016-01-12 DIAGNOSIS — E1122 Type 2 diabetes mellitus with diabetic chronic kidney disease: Secondary | ICD-10-CM

## 2016-01-12 MED ORDER — TRIAMCINOLONE ACETONIDE 0.1 % EX CREA: 1.0000 "application " | TOPICAL_CREAM | Freq: Two times a day (BID) | CUTANEOUS | 0 refills | Status: DC

## 2016-01-12 NOTE — Progress Notes (Signed)
Location:  [facility] Room Number: [phone]  Place of Service:  SNF [phone])  Provider: Marlowe Sax FNP-C   PCP: Wilhemena Durie, MD Patient Care Team: Jerrol Banana., MD as PCP - General (Family Medicine)  Extended Emergency Contact Information Primary Emergency Contact: Rotan,Robert D Address: [address]          [place], [place] [address] [place] Phone: [phone] Relation: Spouse Secondary Emergency Contact: [place] of [place] Phone: [phone] Relation: Son  Code Status: Full Code  Goals of care:  Advanced Directive information Advanced Directives 12/13/2015  Does Patient Have a Medical Advance Directive? No  Type of Advance Directive -  Would patient like information on creating a medical advance directive? No - patient declined information     Allergies  Allergen Reactions  . Ace Inhibitors     cough  . Ciprofloxacin Hcl   . Quinolones Other (See Comments)    Tendon rupture    Chief Complaint  Patient presents with  . Discharge Note    Discharge home     HPI:  82 y.o. female seen today at [facility] for discharge home.she was here for short term rehabilitation post ED visit on 12/13/2015 post fall with closed fracture of proximal end of right fibula.She had a short leg splint placed that visit in the ED by orthopedic and was placed on antibiotic for UTI.She then went home and is here from 12/30/2015 for rehabilitation.she has a medical history HTN, Hyperlipidemia, type 2 DM, OA, Neuropathy, CKD stage 3, sleep Apnea, Anxiety among other conditions.she is seen in her room today.She denies any acute issues this visit. She has had unremarkable stay here in rehab.She has worked well with PT/OT now stable for discharge home.She will be discharged home with Home health PT/OT to continue with ROM, Exercise, Gait stability and muscle strengthening.She will require  standard WC with Cushion, anti tippers, extended brake handles, removable elevating leg rests  to enable her to maintain current level of independence which cannot be achieved with walker or cane.She will also require a semi [facility] bed  with  rails to alleviate pain by allowing right leg to be repositioned in ways not feasible with a normal bed. Patient suffers from right leg pain and swelling due to recent closed fracture of proximal end of right fibula.Pain episodes frequently require frequent and immediate changes in position which cannot be achieved with a normal bed.Home health services will be arranged by facility social worker prior to discharge. Prescription medication will be written x 1 month then patient to follow up with PCP in 1-2 weeks.Facility staff report no new concerns.     Past Medical History:  Diagnosis Date  . Anxiety   . Chronic kidney disease, stage III (moderate)   . Contracture of palmar fascia   . Cough   . Diabetes mellitus   . Hyperlipidemia   . Hypertension   . Obesity   . Osteoarthritis   . Other specified acquired hypothyroidism   . Otitis externa   . Palpitations   . Peripheral neuropathy ([place])   . Pure hypercholesterolemia   . Secondary hyperparathyroidism (of renal origin)   . Sleep apnea   . Unspecified adjustment reaction   . Unspecified hereditary and idiopathic peripheral neuropathy   . Unspecified urinary incontinence     Past Surgical History:  Procedure Laterality Date  . [place]  Ruptured Disc.  Marland Kitchen CARDIAC CATHETERIZATION  03/2008  . COLONOSCOPY    . HAND SURGERY Right   . REPLACEMENT TOTAL KNEE Bilateral   . SHOULDER SURGERY Right 06/2007      reports that she quit smoking about 18 years ago. Her smoking use included Cigarettes. She has a 10.00 pack-year smoking history. She has never used smokeless tobacco. She reports that she does not drink alcohol or use drugs. Social History   Social History  . Marital  status: Married    Spouse name: N/A  . Number of children: 2  . Years of education: N/A   Occupational History  . Retired [employer]    Worked in Proofreader.    Social History Main Topics  . Smoking status: Former Smoker    Packs/day: 10.00    Years: 1.00    Types: Cigarettes    Quit date: 12/27/1997  . Smokeless tobacco: Never Used  . Alcohol use No  . Drug use: No  . Sexual activity: No   Other Topics Concern  . Not on file   Social History Narrative  . No narrative on file      Allergies  Allergen Reactions  . Ace Inhibitors     cough  . Ciprofloxacin Hcl   . Quinolones Other (See Comments)    Tendon rupture    Pertinent  Health Maintenance Due  Topic Date Due  . FOOT EXAM  12/18/1940  . OPHTHALMOLOGY EXAM  12/18/1940  . URINE MICROALBUMIN  12/18/1940  . HEMOGLOBIN A1C  06/01/2016  . INFLUENZA VACCINE  Completed  . DEXA SCAN  Completed  . PNA vac Low Risk Adult  Completed    Medications:   Medication List       Accurate as of 01/12/16  1:37 PM. Always use your most recent med list.          acetaminophen 500 MG tablet Commonly known as:  TYLENOL Take 500 mg by mouth every 6 (six) hours as needed.   amLODipine 5 MG tablet Commonly known as:  NORVASC Take 5 mg by mouth daily.   aspirin 325 MG EC tablet Take 325 mg by mouth daily.   budesonide 3 MG 24 hr capsule Commonly known as:  ENTOCORT EC Take 2 capsules (6 mg total) by mouth daily.   buPROPion 150 MG 12 hr tablet Commonly known as:  WELLBUTRIN SR TAKE 1 TABLET BY MOUTH 2 TIMES DAILY   Cetirizine HCl 10 MG Caps Take 1 capsule by mouth daily. Reported on 08/17/2015   escitalopram 20 MG tablet Commonly known as:  LEXAPRO Take 1 tablet (20 mg total) by mouth daily.   glipiZIDE 5 MG tablet Commonly known as:  GLUCOTROL TAKE 1 TABLET BY MOUTH ONCE A DAY   imipramine 25 MG tablet Commonly known as:  TOFRANIL Take 1 tablet (25 mg total) by mouth 2 (two) times daily.     LORazepam 0.5 MG tablet Commonly known as:  ATIVAN TAKE 1 TABLET BY MOUTH TWICE A DAY   montelukast 10 MG tablet Commonly known as:  SINGULAIR TAKE 1 TABLET BY MOUTH ONCE A DAY   nitrofurantoin 100 MG capsule Commonly known as:  MACRODANTIN Take 100 mg by mouth 2 (two) times daily.   polyethylene glycol powder powder Commonly known as:  GLYCOLAX/MIRALAX Take 17 g by mouth daily.   saccharomyces boulardii 250 MG capsule Commonly known as:  FLORASTOR Take 250 mg by mouth 2 (two) times daily.   simvastatin 20  MG tablet Commonly known as:  ZOCOR Take 20 mg by mouth at bedtime. Reported on 08/17/2015       Review of Systems  Constitutional: Negative for activity change, appetite change, chills, fatigue and fever.  HENT: Negative for congestion, rhinorrhea, sinus pain, sinus pressure, sneezing and sore throat.   Eyes: Negative.   Respiratory: Negative for cough, choking, chest tightness, shortness of breath and wheezing.   Cardiovascular: Negative for chest pain, palpitations and leg swelling.  Gastrointestinal: Negative for abdominal distention, abdominal pain, constipation, diarrhea, nausea and vomiting.  Endocrine: Negative.   Genitourinary: Negative for dysuria, frequency and urgency.  Musculoskeletal: Positive for gait problem.       Right leg boot   Skin: Negative for color change, pallor, rash and wound.  Neurological: Negative for dizziness, seizures, syncope, weakness, light-headedness and headaches.  Hematological: Does not bruise/bleed easily.  Psychiatric/Behavioral: Negative for agitation, confusion, hallucinations and sleep disturbance. The patient is not nervous/anxious.     Vitals:   01/12/16 1200  BP: (!) 153/76  Pulse: 74  Resp: 18  Temp: 97.8 F (36.6 C)  SpO2: 94%  Weight: (!) 339 lb 9.6 oz (154 kg)  Height: 5' 9"  (1.753 m)   Body mass index is 50.15 kg/m. Physical Exam  Constitutional: She is oriented to person, place, and time. She appears  well-developed and well-nourished. No distress.  HENT:  Head: Normocephalic.  Mouth/Throat: Oropharynx is clear and moist. No oropharyngeal exudate.  Eyes: Conjunctivae and EOM are normal. Pupils are equal, round, and reactive to light. Right eye exhibits no discharge. Left eye exhibits no discharge. No scleral icterus.  Neck: Normal range of motion. No JVD present. No thyromegaly present.  Cardiovascular: Normal rate, regular rhythm, normal heart sounds and intact distal pulses.  Exam reveals no gallop and no friction rub.   No murmur heard. Pulmonary/Chest: Effort normal and breath sounds normal. No respiratory distress. She has no wheezes. She has no rales.  Abdominal: Soft. Bowel sounds are normal. She exhibits no distension. There is no tenderness. There is no rebound and no guarding.  Musculoskeletal: She exhibits no tenderness or deformity.  Moves x 4 extremities except right leg boot in place. Toe pink, warm to touch and moves without any difficulty.   Lymphadenopathy:    She has no cervical adenopathy.  Neurological: She is oriented to person, place, and time.  Skin: Skin is warm and dry. No rash noted. No erythema. No pallor.  Psychiatric: She has a normal mood and affect.    Labs reviewed: Basic Metabolic Panel:  Recent Labs  11/05/15 2100 12/02/15 1241 12/13/15 2342 01/03/16  NA 137 144 137 147  K 3.1* 3.9 3.2* 3.6  CL 98* 97 97*  --   CO2 28 30* 30  --   GLUCOSE 153* 104* 145*  --   BUN 24* 16 23* 19  CREATININE 1.56* 1.25* 1.50* 1.4*  CALCIUM 9.0 9.6 9.2  --    Liver Function Tests:  Recent Labs  12/02/15 1241 12/13/15 2342 01/03/16  AST 10 22 10*  ALT 11 16 10   ALKPHOS 70 61 70  BILITOT 0.4 0.5  --   PROT 6.3 6.7  --   ALBUMIN 3.6 3.6  --    CBC:  Recent Labs  11/05/15 2100 12/13/15 2342 01/03/16  WBC 15.9* 14.5* 7.4  NEUTROABS  --  13.0*  --   HGB 14.5 13.2 11.9*  HCT 42.1 40.6 38  MCV 83.7 85.6  --   PLT  221 212 182   Cardiac  Enzymes:  Recent Labs  11/05/15 2100 12/13/15 2342  TROPONINI <0.03 <0.03    Assessment/Plan:   1. Unsteady gait Has worked well with PT/ OT. Will discharge home PT/OT to continue with ROM, Exercise, Gait stability and muscle strengthening.She will require standard WC with Cushion, anti tippers, extended brake handles, removable elevating leg rests  to enable her to maintain current level of independence which cannot be achieved with walker or cane.She will also require a semi [facility] bed  with  rails to alleviate pain by allowing right leg to be repositioned in ways not feasible with a normal bed. Patient suffers from right leg pain and swelling due to recent closed fracture of proximal end of right fibula.Pain episodes frequently require frequent and immediate changes in position which cannot be achieved with a normal bed. Fall and safety precautions.   2. Closed fracture of proximal end of right fibula with routine healing, unspecified fracture morphology, subsequent encounter Continue to follow up with ortho as directed.   3. Controlled type 2 diabetes mellitus with stage 3 chronic kidney disease, without long-term current use of insulin CBG ranging in the 90's-150's.continue on low calorie diet. Monitor Hgb A1C. On ASA.Annual foot and eye exam deferred to PCP patient was here for short term rehab.   4. Chronic kidney disease (CKD), stage III (moderate) CR stable at baseline. Continue to control high risks factors. BMP in 1-2 weeks with PCP.   5. Obstructive sleep apnea syndrome Follow up with Pulmonary as needed.   6. Anxiety Stable.Continue on Lexapro.   7. Depression Stable. Continue on Remeron and Lexapro. Monitor for mood changes.    Patient is being discharged with the following home health services:   -PT/OT for ROM, Exercise, Gait stability and Muscle strengthening.   Patient is being discharged with the following durable medical equipment:    -[place] with 1/2 rails  - Standard wheelchair with cushion, elevating leg rest, Brake extension and anti-tippers.   Patient has been advised to f/u with their PCP in 1-2 weeks to for a transitions of care visit.Social services at their facility was responsible for arranging this appointment.Pt was provided with adequate prescriptions of noncontrolled medications to reach the scheduled appointment.For controlled substances, a limited supply was provided as appropriate for the individual patient.If the pt normally receives these medications from a pain clinic or has a contract with another physician, these medications should be received from that clinic or physician only).    Future labs/tests needed:  CBC/diff, BMP in 1-2 weeks with PCP

## 2016-01-19 ENCOUNTER — Encounter: Payer: Self-pay | Admitting: Family Medicine

## 2016-01-19 ENCOUNTER — Ambulatory Visit (INDEPENDENT_AMBULATORY_CARE_PROVIDER_SITE_OTHER): Payer: Medicare Other | Admitting: Family Medicine

## 2016-01-19 VITALS — BP 146/66 | HR 78 | Temp 97.6°F | Resp 18

## 2016-01-19 DIAGNOSIS — R531 Weakness: Secondary | ICD-10-CM

## 2016-01-19 DIAGNOSIS — R296 Repeated falls: Secondary | ICD-10-CM | POA: Diagnosis not present

## 2016-01-19 DIAGNOSIS — I10 Essential (primary) hypertension: Secondary | ICD-10-CM

## 2016-01-19 DIAGNOSIS — J40 Bronchitis, not specified as acute or chronic: Secondary | ICD-10-CM

## 2016-01-19 DIAGNOSIS — T148XXA Other injury of unspecified body region, initial encounter: Secondary | ICD-10-CM

## 2016-01-19 DIAGNOSIS — R2681 Unsteadiness on feet: Secondary | ICD-10-CM

## 2016-01-19 MED ORDER — DOXYCYCLINE HYCLATE 100 MG PO TABS: 100.0000 mg | ORAL_TABLET | Freq: Two times a day (BID) | ORAL | 0 refills | Status: DC

## 2016-01-19 NOTE — Progress Notes (Signed)
Subjective:  HPI Pt is here to follow up from being released from [facility].  They had ordered home health for her to have OT and PT, exercise, gait stability and muscle strengthing in the home. [facility] [place], husband wanted someone local. Pt has not heard form any home health facility. They need orders for this to be done. Pt has fallen 22 times in the last 6 months. Also noted she needed CBC and BMP repeated in 1-2 weeks with PCP.   Pt also c/o cough and congestion. Started about 3-4 days ago. She is more short of breath than normal. Denies any nasal congestion. She has been taking cough syrup and it has helped some.  Pt is in wheelchair and her husband brings her In today.   Prior to Admission medications   Medication Sig Start Date End Date Taking? Authorizing Provider  acetaminophen (TYLENOL) 500 MG tablet Take 500 mg by mouth every 6 (six) hours as needed.    Historical Provider, MD  amLODipine (NORVASC) 5 MG tablet Take 5 mg by mouth daily.    Historical Provider, MD  aspirin 325 MG EC tablet Take 325 mg by mouth daily. 01/02/16 02/01/16  Historical Provider, MD  budesonide (ENTOCORT EC) 3 MG 24 hr capsule Take 2 capsules (6 mg total) by mouth daily. 07/05/15   Margarita Rana, MD  buPROPion [facility] SR) 150 MG 12 hr tablet TAKE 1 TABLET BY MOUTH 2 TIMES DAILY 07/25/15   Margarita Rana, MD  cephALEXin [facility]) 500 MG capsule  12/14/15   Historical Provider, MD  Cetirizine HCl 10 MG CAPS Take 1 capsule by mouth daily. Reported on 08/17/2015 06/15/13   Historical Provider, MD  escitalopram (LEXAPRO) 20 MG tablet Take 1 tablet (20 mg total) by mouth daily. 12/17/14   Margarita Rana, MD  glipiZIDE (GLUCOTROL) 5 MG tablet TAKE 1 TABLET BY MOUTH ONCE A DAY 07/25/15   Margarita Rana, MD  imipramine (TOFRANIL) 25 MG tablet Take 1 tablet (25 mg total) by mouth 2 (two) times daily. Patient taking differently: Take 25 mg by mouth at  bedtime.  10/07/15   Khaila Velarde Maceo Pro., MD  LORazepam (ATIVAN) 0.5 MG tablet TAKE 1 TABLET BY MOUTH TWICE A DAY Patient taking differently: TAKE 1 TABLET BY MOUTH TWICE A DAY as needed 01/31/15   Margarita Rana, MD  montelukast (SINGULAIR) 10 MG tablet TAKE 1 TABLET BY MOUTH ONCE A DAY 12/14/15   Breylen Agyeman Maceo Pro., MD  nitrofurantoin (MACRODANTIN) 100 MG capsule Take 100 mg by mouth 2 (two) times daily.    Historical Provider, MD  nitrofurantoin, macrocrystal-monohydrate, (MACROBID) 100 MG capsule  01/13/16   Historical Provider, MD  polyethylene glycol powder (GLYCOLAX/MIRALAX) powder Take 17 g by mouth daily. 05/31/15   Margarita Rana, MD  saccharomyces boulardii (FLORASTOR) 250 MG capsule Take 250 mg by mouth 2 (two) times daily.    Historical Provider, MD  simvastatin (ZOCOR) 20 MG tablet Take 20 mg by mouth at bedtime. Reported on 08/17/2015 07/18/15   Margarita Rana, MD  triamcinolone cream (KENALOG) 0.1 % Apply 1 application topically 2 (two) times daily. Right lateral shin area 01/12/16   Sandrea Hughs, NP    Patient Active Problem List   Diagnosis Date Noted  . Memory loss 07/18/2015  . Depression 01/12/2015  . Shoulder pain 12/17/2014  . Pain in shoulder 08/05/2014  . Gonalgia 07/21/2014  . History of  knee surgery 07/21/2014  . Allergic rhinitis 07/16/2014  . Anxiety 07/16/2014  . Benign hypertension 07/16/2014  . Alteration in bowel elimination: incontinence 07/16/2014  . Chronic kidney disease (CKD), stage III (moderate) 07/16/2014  . Colitis 07/16/2014  . CN (constipation) 07/16/2014  . D (diarrhea) 07/16/2014  . Contracture of palmar fascia (Dupuytren's) 07/16/2014  . Hypercholesteremia 07/16/2014  . Hypoxia 07/16/2014  . Absence of bladder continence 07/16/2014  . Focal lymphocytic colitis 07/16/2014  . Extreme obesity ([place]) 07/16/2014  . Arthritis, degenerative 07/16/2014  . Disorder of peripheral nervous system ([place]) 07/16/2014  . Neuralgia neuritis, sciatic nerve  07/16/2014  . Hyperparathyroidism, secondary ([place]) 07/16/2014  . Apnea, sleep 07/16/2014  . Subclinical hypothyroidism 07/16/2014  . Controlled type 2 diabetes mellitus ([place]) 07/16/2014  . 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 Urinary incontinence without sensory awareness 09/03/2012  . Palpitations 12/28/2010  . SOB (shortness of breath) 12/28/2010  . Overweight 08/02/2009    Past Medical History:  Diagnosis Date  . Anxiety   . Chronic kidney disease, stage III (moderate)   . Contracture of palmar fascia   . Cough   . Diabetes mellitus   . Hyperlipidemia   . Hypertension   . Obesity   . Osteoarthritis   . Other specified acquired hypothyroidism   . Otitis externa   . Palpitations   . Peripheral neuropathy ([place])   . Pure hypercholesterolemia   . Secondary hyperparathyroidism (of renal origin)   . Sleep apnea   . Unspecified adjustment reaction   . Unspecified hereditary and idiopathic peripheral neuropathy   . Unspecified urinary incontinence     Social History   Social History  . Marital status: Married    Spouse name: N/A  . Number of children: 2  . Years of education: N/A   Occupational History  . Retired [employer]    Worked in Proofreader.    Social History Main Topics  . Smoking status: Former Smoker    Packs/day: 10.00    Years: 1.00    Types: Cigarettes    Quit date: 12/27/1997  . Smokeless tobacco: Never Used  . Alcohol use No  . Drug use: No  . Sexual activity: No   Other Topics Concern  . Not on file   Social History Narrative  . No narrative on file    Allergies  Allergen Reactions  . Ace Inhibitors     cough  . Ciprofloxacin Hcl   . Quinolones Other (See Comments)    Tendon rupture    Review of Systems  Constitutional: Positive for malaise/fatigue.  HENT: Negative.   Eyes: Negative.   Respiratory: Positive for cough, shortness of breath and wheezing.   Cardiovascular: Negative.   Gastrointestinal: Negative.     Genitourinary: Negative.   Musculoskeletal: Negative.   Skin: Negative.   Neurological: Positive for weakness.  Endo/Heme/Allergies: Negative.   Psychiatric/Behavioral: Negative.     Immunization History  Administered Date(s) Administered  . Influenza, High Dose Seasonal PF 12/17/2014, 11/28/2015  . Pneumococcal Conjugate-13 11/16/2013  . Pneumococcal Polysaccharide-23 11/12/2007  . Tdap 03/10/2015  . Zoster 07/27/2010    Objective:  BP (!) 146/66 (BP Location: Right Arm, Patient Position: Sitting, Cuff Size: Large)   Pulse 78   Temp 97.6 F (36.4 C) (Oral)   Resp 18   SpO2 90%   Physical Exam  Constitutional: She is oriented to person, place, and time and well-developed, well-nourished, and in no distress.  HENT:  Head: Normocephalic and atraumatic.  Right Ear:  External ear normal.  Left Ear: External ear normal.  Nose: Nose normal.  Eyes: Conjunctivae and EOM are normal. Pupils are equal, round, and reactive to light.  Neck: Normal range of motion. Neck supple.  Cardiovascular: Normal rate, regular rhythm, normal heart sounds and intact distal pulses.   Pulmonary/Chest: Effort normal. She has wheezes (mild expiratory wheezes, worse on the right than left).  Abdominal: Soft.  Musculoskeletal: Normal range of motion.  Neurological: She is alert and oriented to person, place, and time. No cranial nerve deficit. She exhibits normal muscle tone.  Skin: Skin is warm and dry.  Psychiatric: Mood, memory, affect and judgment normal.    Lab Results  Component Value Date   WBC 10.9 01/11/2016   HGB 12.4 01/11/2016   HCT 40 01/11/2016   PLT 223 01/11/2016   GLUCOSE 145 (H) 12/13/2015   CHOL 230 (H) 12/02/2015   TRIG 175 (H) 12/02/2015   HDL 45 12/02/2015   LDLCALC 150 (H) 12/02/2015   TSH 3.700 12/20/2014   INR 1.0 04/08/2008   HGBA1C 5.7 (H) 12/02/2015    CMP     Component Value Date/Time   NA 147 01/03/2016   NA 139 05/28/2014 1023   K 3.6 01/03/2016   K 3.3  (L) 05/28/2014 1023   CL 97 (L) 12/13/2015 2342   CL 101 05/28/2014 1023   CO2 30 12/13/2015 2342   CO2 31 05/28/2014 1023   GLUCOSE 145 (H) 12/13/2015 2342   GLUCOSE 121 (H) 05/28/2014 1023   BUN 19 01/03/2016   BUN 21 (H) 05/28/2014 1023   CREATININE 1.4 (A) 01/03/2016   CREATININE 1.50 (H) 12/13/2015 2342   CREATININE 1.29 (H) 05/28/2014 1023   CALCIUM 9.2 12/13/2015 2342   CALCIUM 8.9 05/28/2014 1023   PROT 6.7 12/13/2015 2342   PROT 6.3 12/02/2015 1241   PROT 6.9 05/28/2014 1023   ALBUMIN 3.6 12/13/2015 2342   ALBUMIN 3.6 12/02/2015 1241   ALBUMIN 3.7 05/28/2014 1023   AST 10 (A) 01/03/2016   AST 17 05/28/2014 1023   ALT 10 01/03/2016   ALT 15 05/28/2014 1023   ALKPHOS 70 01/03/2016   ALKPHOS 54 05/28/2014 1023   BILITOT 0.5 12/13/2015 2342   BILITOT 0.4 12/02/2015 1241   BILITOT 0.6 05/28/2014 1023   GFRNONAA 31 (L) 12/13/2015 2342   GFRNONAA 39 (L) 05/28/2014 1023   GFRAA 36 (L) 12/13/2015 2342   GFRAA 45 (L) 05/28/2014 1023    Assessment and Plan :  1. Weakness generalized  - Ambulatory referral to [place]  2. Recurrent falls  - Ambulatory referral to [place]  3. Fracture   4. Unsteady gait  - Ambulatory referral to Home Health  5. Bronchitis  - doxycycline (VIBRA-TABS) 100 MG tablet; Take 1 tablet (100 mg total) by mouth 2 (two) times daily.  Dispense: 14 tablet; Refill: 0 - CBC with Differential/Platelet  6. Benign hypertension  - Renal function panel 7.Morbid Obesity Contributes greatly to fall risk as pt does not have the trength to carry her bulk.PT should help some.Weight loss would help. 8.TIIDM HPI, Exam, and A&P Transcribed under the direction and in the presence of Camia Dipinto L. Cranford Mon, MD  Electronically Signed: Webb Laws, CMA I have done the exam and reviewed the above chart and it is accurate to the best of my knowledge. Development worker, community has been used in this note in any air is in the dictation or transcription are  unintentional.  Miguel Aschoff MD [facility]  [facility] 01/19/2016 4:00 PM

## 2016-01-20 LAB — CBC WITH DIFFERENTIAL/PLATELET
BASOS: 0 %
Basophils Absolute: 0 10*3/uL (ref 0.0–0.2)
EOS (ABSOLUTE): 1.5 10*3/uL — AB (ref 0.0–0.4)
Eos: 15 %
Hematocrit: 37.4 % (ref 34.0–46.6)
Hemoglobin: 12 g/dL (ref 11.1–15.9)
IMMATURE GRANULOCYTES: 0 %
Immature Grans (Abs): 0 10*3/uL (ref 0.0–0.1)
LYMPHS ABS: 1.1 10*3/uL (ref 0.7–3.1)
Lymphs: 10 %
MCH: 27.8 pg (ref 26.6–33.0)
MCHC: 32.1 g/dL (ref 31.5–35.7)
MCV: 87 fL (ref 79–97)
MONOS ABS: 0.5 10*3/uL (ref 0.1–0.9)
Monocytes: 5 %
NEUTROS PCT: 70 %
Neutrophils Absolute: 7.2 10*3/uL — ABNORMAL HIGH (ref 1.4–7.0)
PLATELETS: 227 10*3/uL (ref 150–379)
RBC: 4.32 x10E6/uL (ref 3.77–5.28)
RDW: 16.1 % — AB (ref 12.3–15.4)
WBC: 10.4 10*3/uL (ref 3.4–10.8)

## 2016-01-20 LAB — RENAL FUNCTION PANEL
Albumin: 3.5 g/dL (ref 3.5–4.7)
BUN / CREAT RATIO: 12 (ref 12–28)
BUN: 14 mg/dL (ref 8–27)
CALCIUM: 9.1 mg/dL (ref 8.7–10.3)
CO2: 28 mmol/L (ref 18–29)
Chloride: 102 mmol/L (ref 96–106)
Creatinine, Ser: 1.17 mg/dL — ABNORMAL HIGH (ref 0.57–1.00)
GFR, EST AFRICAN AMERICAN: 50 mL/min/{1.73_m2} — AB (ref >59)
GFR, EST NON AFRICAN AMERICAN: 43 mL/min/{1.73_m2} — AB (ref >59)
Glucose: 66 mg/dL (ref 65–99)
Phosphorus: 3.3 mg/dL (ref 2.5–4.5)
Potassium: 3.8 mmol/L (ref 3.5–5.2)
SODIUM: 144 mmol/L (ref 134–144)

## 2016-01-25 ENCOUNTER — Telehealth: Payer: Self-pay | Admitting: Family Medicine

## 2016-01-25 ENCOUNTER — Ambulatory Visit: Payer: Medicare Other | Admitting: Family Medicine

## 2016-01-25 NOTE — Telephone Encounter (Signed)
For review. KW

## 2016-01-25 NOTE — Telephone Encounter (Signed)
Glenetta Hew calling from [facility] to report a level 1 medication with Amlodipine & Simvastatin.  Stanton Kidney states they are to call Dr to report a level 1 medication.  CB# [phone].  Thanks CC

## 2016-01-26 NOTE — Telephone Encounter (Signed)
Nurse was advised. KW

## 2016-01-26 NOTE — Telephone Encounter (Signed)
Dosing is ok with combination

## 2016-02-10 ENCOUNTER — Telehealth: Payer: Self-pay | Admitting: Family Medicine

## 2016-02-10 NOTE — Telephone Encounter (Signed)
Wayne with [facility] called to advise pt had a fall transferring to the bedside commode.  Pt did fall to the floor.  This happened yesterday afternoon at home.  CB#[phone]/MW

## 2016-02-10 NOTE — Telephone Encounter (Signed)
FYI information per [facility]

## 2016-02-15 NOTE — Telephone Encounter (Signed)
Haskell with [facility] called today 02/15/16 stating that pt had a fall again.  When he came to the home to do  PT the husband told him that Deborah [name] had fallen this am.  There is no pain now and she was able to do the PT toady  Call back is [phone]  Thanks Con Memos

## 2016-02-16 ENCOUNTER — Telehealth: Payer: Self-pay | Admitting: Family Medicine

## 2016-02-16 NOTE — Telephone Encounter (Signed)
error 

## 2016-02-21 ENCOUNTER — Telehealth: Payer: Self-pay | Admitting: Emergency Medicine

## 2016-02-21 ENCOUNTER — Other Ambulatory Visit: Payer: Self-pay | Admitting: Emergency Medicine

## 2016-02-21 DIAGNOSIS — N390 Urinary tract infection, site not specified: Secondary | ICD-10-CM

## 2016-02-21 NOTE — Telephone Encounter (Signed)
Tried to call pt about urine culture. Tried to call home phone twice and number was busy, then tried to call mobile and LMTCB. Pt culture was positive and he wants to treat her with Nitrofurantion 100 mg BID for 5 days # 10 No refills. Thanks.

## 2016-02-21 NOTE — Telephone Encounter (Signed)
Tried to call pt about urine culture. Tried home phone and was busy (twice) then called mobile and LMTCB. Urine grew out bacteria. Dr. Rosanna Randy wants to treat with Nitrofurantion 100 mg BID for 5 days # 10 no refills.

## 2016-02-23 ENCOUNTER — Telehealth: Payer: Self-pay | Admitting: Family Medicine

## 2016-02-23 MED ORDER — NITROFURANTOIN MONOHYD MACRO 100 MG PO CAPS: 100.0000 mg | ORAL_CAPSULE | Freq: Two times a day (BID) | ORAL | 0 refills | Status: DC

## 2016-02-23 NOTE — Telephone Encounter (Signed)
Deborah [name] advised of below-aa

## 2016-02-23 NOTE — Telephone Encounter (Signed)
Please review-aa 

## 2016-02-23 NOTE — Telephone Encounter (Signed)
Deborah [name] PT with [facility] called saying pt fell yesterday.  Other than brusing no injuries.  Deborah [name] wants to request 3 weeks of PT and medical social work referral due to patient having multiple falls.  For verbal order please call her at [place]

## 2016-02-23 NOTE — Telephone Encounter (Signed)
ok 

## 2016-02-23 NOTE — Telephone Encounter (Signed)
Pt advised and RX sent in-aa

## 2016-02-24 ENCOUNTER — Encounter: Payer: Self-pay | Admitting: Emergency Medicine

## 2016-02-24 ENCOUNTER — Telehealth: Payer: Self-pay | Admitting: Family Medicine

## 2016-02-24 ENCOUNTER — Emergency Department: Payer: Medicare Other

## 2016-02-24 ENCOUNTER — Inpatient Hospital Stay
Admission: EM | Admit: 2016-02-24 | Discharge: 2016-03-02 | DRG: 871 | Disposition: A | Discharge status: Skilled Nursing Facility | Payer: Medicare Other | Attending: Internal Medicine | Admitting: Internal Medicine

## 2016-02-24 DIAGNOSIS — A419 Sepsis, unspecified organism: Principal | ICD-10-CM | POA: Diagnosis present

## 2016-02-24 DIAGNOSIS — G3183 Dementia with Lewy bodies: Secondary | ICD-10-CM | POA: Diagnosis present

## 2016-02-24 DIAGNOSIS — E1140 Type 2 diabetes mellitus with diabetic neuropathy, unspecified: Secondary | ICD-10-CM | POA: Diagnosis present

## 2016-02-24 DIAGNOSIS — Z7189 Other specified counseling: Secondary | ICD-10-CM

## 2016-02-24 DIAGNOSIS — N183 Chronic kidney disease, stage 3 unspecified: Secondary | ICD-10-CM | POA: Diagnosis present

## 2016-02-24 DIAGNOSIS — E1122 Type 2 diabetes mellitus with diabetic chronic kidney disease: Secondary | ICD-10-CM | POA: Diagnosis present

## 2016-02-24 DIAGNOSIS — Z7984 Long term (current) use of oral hypoglycemic drugs: Secondary | ICD-10-CM

## 2016-02-24 DIAGNOSIS — F329 Major depressive disorder, single episode, unspecified: Secondary | ICD-10-CM | POA: Diagnosis present

## 2016-02-24 DIAGNOSIS — Z6841 Body Mass Index (BMI) 40.0 and over, adult: Secondary | ICD-10-CM | POA: Diagnosis not present

## 2016-02-24 DIAGNOSIS — R627 Adult failure to thrive: Secondary | ICD-10-CM | POA: Diagnosis present

## 2016-02-24 DIAGNOSIS — Z515 Encounter for palliative care: Secondary | ICD-10-CM

## 2016-02-24 DIAGNOSIS — F0280 Dementia in other diseases classified elsewhere without behavioral disturbance: Secondary | ICD-10-CM

## 2016-02-24 DIAGNOSIS — Z79899 Other long term (current) drug therapy: Secondary | ICD-10-CM | POA: Diagnosis not present

## 2016-02-24 DIAGNOSIS — R443 Hallucinations, unspecified: Secondary | ICD-10-CM | POA: Diagnosis not present

## 2016-02-24 DIAGNOSIS — N390 Urinary tract infection, site not specified: Secondary | ICD-10-CM | POA: Diagnosis not present

## 2016-02-24 DIAGNOSIS — N3000 Acute cystitis without hematuria: Secondary | ICD-10-CM | POA: Diagnosis present

## 2016-02-24 DIAGNOSIS — F419 Anxiety disorder, unspecified: Secondary | ICD-10-CM | POA: Diagnosis present

## 2016-02-24 DIAGNOSIS — E876 Hypokalemia: Secondary | ICD-10-CM | POA: Diagnosis not present

## 2016-02-24 DIAGNOSIS — E662 Morbid (severe) obesity with alveolar hypoventilation: Secondary | ICD-10-CM | POA: Diagnosis present

## 2016-02-24 DIAGNOSIS — R296 Repeated falls: Secondary | ICD-10-CM | POA: Diagnosis present

## 2016-02-24 DIAGNOSIS — Z96653 Presence of artificial knee joint, bilateral: Secondary | ICD-10-CM | POA: Diagnosis present

## 2016-02-24 DIAGNOSIS — E785 Hyperlipidemia, unspecified: Secondary | ICD-10-CM | POA: Diagnosis present

## 2016-02-24 DIAGNOSIS — G9341 Metabolic encephalopathy: Secondary | ICD-10-CM | POA: Diagnosis present

## 2016-02-24 DIAGNOSIS — I129 Hypertensive chronic kidney disease with stage 1 through stage 4 chronic kidney disease, or unspecified chronic kidney disease: Secondary | ICD-10-CM | POA: Diagnosis present

## 2016-02-24 DIAGNOSIS — I509 Heart failure, unspecified: Secondary | ICD-10-CM | POA: Diagnosis not present

## 2016-02-24 DIAGNOSIS — Z87891 Personal history of nicotine dependence: Secondary | ICD-10-CM | POA: Diagnosis not present

## 2016-02-24 DIAGNOSIS — R531 Weakness: Secondary | ICD-10-CM | POA: Diagnosis present

## 2016-02-24 DIAGNOSIS — Z7401 Bed confinement status: Secondary | ICD-10-CM | POA: Diagnosis not present

## 2016-02-24 DIAGNOSIS — Z66 Do not resuscitate: Secondary | ICD-10-CM | POA: Diagnosis present

## 2016-02-24 DIAGNOSIS — A084 Viral intestinal infection, unspecified: Secondary | ICD-10-CM | POA: Diagnosis present

## 2016-02-24 DIAGNOSIS — R0902 Hypoxemia: Secondary | ICD-10-CM

## 2016-02-24 DIAGNOSIS — B9620 Unspecified Escherichia coli [E. coli] as the cause of diseases classified elsewhere: Secondary | ICD-10-CM | POA: Diagnosis present

## 2016-02-24 DIAGNOSIS — I10 Essential (primary) hypertension: Secondary | ICD-10-CM | POA: Diagnosis present

## 2016-02-24 DIAGNOSIS — E119 Type 2 diabetes mellitus without complications: Secondary | ICD-10-CM

## 2016-02-24 LAB — COMPREHENSIVE METABOLIC PANEL
ALK PHOS: 63 U/L (ref 38–126)
ALT: 13 U/L — ABNORMAL LOW (ref 14–54)
ANION GAP: 11 (ref 5–15)
AST: 23 U/L (ref 15–41)
Albumin: 3.6 g/dL (ref 3.5–5.0)
BUN: 15 mg/dL (ref 6–20)
CALCIUM: 8.8 mg/dL — AB (ref 8.9–10.3)
CO2: 26 mmol/L (ref 22–32)
Chloride: 102 mmol/L (ref 101–111)
Creatinine, Ser: 1.21 mg/dL — ABNORMAL HIGH (ref 0.44–1.00)
GFR, EST AFRICAN AMERICAN: 47 mL/min — AB (ref >60)
GFR, EST NON AFRICAN AMERICAN: 40 mL/min — AB (ref >60)
Glucose, Bld: 166 mg/dL — ABNORMAL HIGH (ref 65–99)
Potassium: 4 mmol/L (ref 3.5–5.1)
SODIUM: 139 mmol/L (ref 135–145)
Total Bilirubin: 1.5 mg/dL — ABNORMAL HIGH (ref 0.3–1.2)
Total Protein: 6.9 g/dL (ref 6.5–8.1)

## 2016-02-24 LAB — URINALYSIS, ROUTINE W REFLEX MICROSCOPIC
BILIRUBIN URINE: NEGATIVE
GLUCOSE, UA: NEGATIVE mg/dL
Ketones, ur: 20 mg/dL — AB
LEUKOCYTES UA: NEGATIVE
NITRITE: POSITIVE — AB
PH: 5 (ref 5.0–8.0)
Protein, ur: 30 mg/dL — AB
SPECIFIC GRAVITY, URINE: 1.017 (ref 1.005–1.030)

## 2016-02-24 LAB — INFLUENZA PANEL BY PCR (TYPE A & B)
INFLAPCR: NEGATIVE
INFLBPCR: NEGATIVE

## 2016-02-24 LAB — LIPASE, BLOOD

## 2016-02-24 LAB — CBC WITH DIFFERENTIAL/PLATELET
BASOS PCT: 0 %
Basophils Absolute: 0 10*3/uL (ref 0–0.1)
EOS ABS: 0 10*3/uL (ref 0–0.7)
Eosinophils Relative: 0 %
HCT: 43.3 % (ref 35.0–47.0)
HEMOGLOBIN: 14.1 g/dL (ref 12.0–16.0)
Lymphocytes Relative: 2 %
Lymphs Abs: 0.2 10*3/uL — ABNORMAL LOW (ref 1.0–3.6)
MCH: 28.1 pg (ref 26.0–34.0)
MCHC: 32.6 g/dL (ref 32.0–36.0)
MCV: 86.3 fL (ref 80.0–100.0)
MONO ABS: 0.3 10*3/uL (ref 0.2–0.9)
Monocytes Relative: 3 %
NEUTROS ABS: 8 10*3/uL — AB (ref 1.4–6.5)
Neutrophils Relative %: 95 %
PLATELETS: 182 10*3/uL (ref 150–440)
RBC: 5.01 MIL/uL (ref 3.80–5.20)
RDW: 17.3 % — ABNORMAL HIGH (ref 11.5–14.5)
WBC: 8.5 10*3/uL (ref 3.6–11.0)

## 2016-02-24 LAB — C DIFFICILE QUICK SCREEN W PCR REFLEX
C Diff antigen: NEGATIVE
C Diff interpretation: NOT DETECTED
C Diff toxin: NEGATIVE

## 2016-02-24 LAB — GLUCOSE, CAPILLARY: GLUCOSE-CAPILLARY: 140 mg/dL — AB (ref 65–99)

## 2016-02-24 LAB — TROPONIN I

## 2016-02-24 MED ORDER — ONDANSETRON HCL 4 MG/2ML IJ SOLN
4.0000 mg | Freq: Once | INTRAMUSCULAR | Status: AC
  Administered 2016-02-24: 4 mg via INTRAVENOUS
  Filled 2016-02-24: qty 2

## 2016-02-24 MED ORDER — LOPERAMIDE HCL 2 MG PO CAPS
2.0000 mg | ORAL_CAPSULE | Freq: Once | ORAL | Status: AC
  Administered 2016-02-24: 2 mg via ORAL
  Filled 2016-02-24: qty 1

## 2016-02-24 MED ORDER — ACETAMINOPHEN 325 MG PO TABS
ORAL_TABLET | ORAL | Status: AC
  Administered 2016-02-24: 650 mg via ORAL
  Filled 2016-02-24: qty 2

## 2016-02-24 MED ORDER — AZITHROMYCIN 500 MG PO TABS
500.0000 mg | ORAL_TABLET | Freq: Once | ORAL | Status: AC
  Administered 2016-02-24: 500 mg via ORAL
  Filled 2016-02-24: qty 1

## 2016-02-24 MED ORDER — DEXTROSE 5 % IV SOLN: 1.0000 g | Freq: Once | INTRAVENOUS | Status: DC

## 2016-02-24 MED ORDER — FUROSEMIDE 10 MG/ML IJ SOLN
40.0000 mg | Freq: Once | INTRAMUSCULAR | Status: AC
  Administered 2016-02-24: 40 mg via INTRAVENOUS
  Filled 2016-02-24: qty 4

## 2016-02-24 MED ORDER — CEFTRIAXONE SODIUM-DEXTROSE 1-3.74 GM-% IV SOLR
1.0000 g | Freq: Once | INTRAVENOUS | Status: AC
  Administered 2016-02-24: 1 g via INTRAVENOUS
  Filled 2016-02-24: qty 50

## 2016-02-24 MED ORDER — ACETAMINOPHEN 325 MG PO TABS
650.0000 mg | ORAL_TABLET | Freq: Once | ORAL | Status: AC
  Administered 2016-02-24: 650 mg via ORAL

## 2016-02-24 MED ORDER — SODIUM CHLORIDE 0.9 % IV BOLUS (SEPSIS)
1000.0000 mL | Freq: Once | INTRAVENOUS | Status: AC
  Administered 2016-02-24: 1000 mL via INTRAVENOUS

## 2016-02-24 NOTE — ED Notes (Signed)
Patient O2 sat decreased to 87% on room air, patient was placed on 2 liters via [place]. Dr. Kerman Passey aware

## 2016-02-24 NOTE — ED Provider Notes (Signed)
[facility] Emergency Department Provider Note  Time seen: 6:12 PM  I have reviewed the triage vital signs and the nursing notes.   HISTORY  Chief Complaint Nausea; Emesis; and Diarrhea    HPI Deborah [name] is a 83 y.o. female with a past medical history of CK D, anxiety, diabetes, hypertension, hyperlipidemia, who presents to the emergency department for nausea vomiting and diarrhea. According to the patient for the past 2-3 days she has been nauseated with vomiting although denies any vomiting today. States she continues to have diarrhea 5 or 6 episodes so far today. Patient was feeling weak and dehydrated so she came to the emergency department for evaluation.Patient has not tried any over-the-counter medications for her symptoms. Denies any known fever. Denies any abdominal pain. Denies any chest pain.  Past Medical History:  Diagnosis Date  . Anxiety   . Chronic kidney disease, stage III (moderate)   . Contracture of palmar fascia   . Cough   . Diabetes mellitus   . Hyperlipidemia   . Hypertension   . Obesity   . Osteoarthritis   . Other specified acquired hypothyroidism   . Otitis externa   . Palpitations   . Peripheral neuropathy ([place])   . Pure hypercholesterolemia   . Secondary hyperparathyroidism (of renal origin)   . Sleep apnea   . Unspecified adjustment reaction   . Unspecified hereditary and idiopathic peripheral neuropathy   . Unspecified urinary incontinence     Patient Active Problem List   Diagnosis Date Noted  . Memory loss 07/18/2015  . Depression 01/12/2015  . Shoulder pain 12/17/2014  . Pain in shoulder 08/05/2014  . Gonalgia 07/21/2014  . History of knee surgery 07/21/2014  . Allergic rhinitis 07/16/2014  . Anxiety 07/16/2014  . Benign hypertension 07/16/2014  . Alteration in bowel elimination: incontinence 07/16/2014  . Chronic kidney disease (CKD), stage III (moderate) 07/16/2014  . Colitis 07/16/2014  . CN  (constipation) 07/16/2014  . D (diarrhea) 07/16/2014  . Contracture of palmar fascia (Dupuytren's) 07/16/2014  . Hypercholesteremia 07/16/2014  . Hypoxia 07/16/2014  . Absence of bladder continence 07/16/2014  . Focal lymphocytic colitis 07/16/2014  . Extreme obesity ([place]) 07/16/2014  . Arthritis, degenerative 07/16/2014  . Disorder of peripheral nervous system ([place]) 07/16/2014  . Neuralgia neuritis, sciatic nerve 07/16/2014  . Hyperparathyroidism, secondary ([place]) 07/16/2014  . Apnea, sleep 07/16/2014  . Subclinical hypothyroidism 07/16/2014  . Controlled type 2 diabetes mellitus ([place]) 07/16/2014  . 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 Urinary incontinence without sensory awareness 09/03/2012  . Palpitations 12/28/2010  . SOB (shortness of breath) 12/28/2010  . Overweight 08/02/2009    Past Surgical History:  Procedure Laterality Date  . BACK SURGERY  1990   Ruptured Disc.  Marland Kitchen CARDIAC CATHETERIZATION  03/2008  . COLONOSCOPY    . HAND SURGERY Right   . REPLACEMENT TOTAL KNEE Bilateral   . SHOULDER SURGERY Right 06/2007    Prior to Admission medications   Medication Sig Start Date End Date Taking? Authorizing Provider  acetaminophen (TYLENOL) 500 MG tablet Take 500 mg by mouth every 6 (six) hours as needed.    Historical Provider, MD  amLODipine (NORVASC) 5 MG tablet Take 5 mg by mouth daily.    Historical Provider, MD  budesonide (ENTOCORT EC) 3 MG 24 hr capsule Take 2 capsules (6 mg total) by mouth daily. 07/05/15   Margarita Rana, MD  buPROPion (WELLBUTRIN SR) 150 MG 12 hr tablet TAKE 1 TABLET BY MOUTH 2 TIMES  DAILY 07/25/15   Margarita Rana, MD  cephALEXin [facility]) 500 MG capsule  12/14/15   Historical Provider, MD  Cetirizine HCl 10 MG CAPS Take 1 capsule by mouth daily. Reported on 08/17/2015 06/15/13   Historical Provider, MD  doxycycline (VIBRA-TABS) 100 MG tablet Take 1 tablet (100 mg total) by mouth 2 (two) times daily. 01/19/16   Richard Maceo Pro., MD  escitalopram (LEXAPRO)  20 MG tablet Take 1 tablet (20 mg total) by mouth daily. 12/17/14   Margarita Rana, MD  glipiZIDE (GLUCOTROL) 5 MG tablet TAKE 1 TABLET BY MOUTH ONCE A DAY 07/25/15   Margarita Rana, MD  imipramine (TOFRANIL) 25 MG tablet Take 1 tablet (25 mg total) by mouth 2 (two) times daily. Patient taking differently: Take 25 mg by mouth at bedtime.  10/07/15   Richard Maceo Pro., MD  LORazepam (ATIVAN) 0.5 MG tablet TAKE 1 TABLET BY MOUTH TWICE A DAY Patient taking differently: TAKE 1 TABLET BY MOUTH TWICE A DAY as needed 01/31/15   Margarita Rana, MD  montelukast (SINGULAIR) 10 MG tablet TAKE 1 TABLET BY MOUTH ONCE A DAY 12/14/15   Richard Maceo Pro., MD  nitrofurantoin (MACRODANTIN) 100 MG capsule Take 100 mg by mouth 2 (two) times daily.    Historical Provider, MD  nitrofurantoin, macrocrystal-monohydrate, (MACROBID) 100 MG capsule Take 1 capsule (100 mg total) by mouth 2 (two) times daily. 02/23/16   Richard Maceo Pro., MD  polyethylene glycol powder (GLYCOLAX/MIRALAX) powder Take 17 g by mouth daily. 05/31/15   Margarita Rana, MD  saccharomyces boulardii (FLORASTOR) 250 MG capsule Take 250 mg by mouth 2 (two) times daily.    Historical Provider, MD  simvastatin (ZOCOR) 20 MG tablet Take 20 mg by mouth at bedtime. Reported on 08/17/2015 07/18/15   Margarita Rana, MD  triamcinolone cream (KENALOG) 0.1 % Apply 1 application topically 2 (two) times daily. Right lateral shin area 01/12/16   Dinah C Ngetich, NP    Allergies  Allergen Reactions  . Ace Inhibitors     cough  . Ciprofloxacin Hcl   . Quinolones Other (See Comments)    Tendon rupture    Family History  Problem Relation Age of Onset  . Heart attack Father   . Hypertension Sister   . Hypertension Sister   . Alzheimer's disease Mother     Social History Social History  Substance Use Topics  . Smoking status: Former Smoker    Packs/day: 10.00    Years: 1.00    Types: Cigarettes    Quit date: 12/27/1997  . Smokeless tobacco: Never Used   . Alcohol use No    Review of Systems Constitutional: Negative for fever. Cardiovascular: Negative for chest pain. Respiratory: Negative for shortness of breath.Negative for cough. Gastrointestinal: Negative for abdominal pain. Positive for nausea, vomiting, diarrhea. Genitourinary: Negative for dysuria. Neurological: Negative for headache 10-point ROS otherwise negative.  ____________________________________________   PHYSICAL EXAM:  VITAL SIGNS: ED Triage Vitals  Enc Vitals Group     BP 02/24/16 1751 100/60     Pulse Rate 02/24/16 1751 97     Resp 02/24/16 1751 16     Temp 02/24/16 1751 100.3 F (37.9 C)     Temp Source 02/24/16 1751 Oral     SpO2 02/24/16 1751 95 %     Weight 02/24/16 1752 (!) 339 lb (153.8 kg)     Height --      Head Circumference --      Peak Flow --  Pain Score --      Pain Loc --      Pain Edu? --      Excl. in [place]? --     Constitutional: Alert and oriented. Well appearing and in no distress. Eyes: Normal exam ENT   Head: Normocephalic and atraumatic.   Mouth/Throat: Mucous membranes are moist. Cardiovascular: Normal rate, regular rhythm. Respiratory: Normal respiratory effort without tachypnea nor retractions. Breath sounds are clear Gastrointestinal: Soft and nontender. No distention.   Musculoskeletal: Nontender with normal range of motion in all extremities.  Neurologic:  Normal speech and language. No gross focal neurologic deficits Skin:  Skin is warm, dry and intact.  Psychiatric: Mood and affect are normal.   ____________________________________________    EKG  EKG reviewed and interpreted as social as normal sinus rhythm at 96 bpm. Narrow QRS, normal axis, largely normal intervals with nonspecific ST changes without ST elevation.  ____________________________________________   INITIAL IMPRESSION / ASSESSMENT AND PLAN / ED COURSE  Pertinent labs & imaging results that were available during my care of the patient  were reviewed by me and considered in my medical decision making (see chart for details).  Patient presents the emergency department generalized weakness, nausea vomiting and diarrhea for the past 2-3 days. Denies any vomiting today states continued diarrhea. Patient has a mild fever 100.3 in the emergency department, unknown to the patient. We will check labs, chest x-ray, influenza and urinalysis.  Patient's urinalysis is nitrite positive. We will start the patient on IV Rocephin. Overall she appears well, normal blood work including white blood cell count. I discussed the patient with the husband and son who essentially said no acute change but instead a progressive decline in mental status over the past 8 months with significant worsening dementia. Husband states she was kicked out of short-term rehabilitation for not showing progression/improvement. She had home health care until this week when the home health care aide to see quit because they stated they could no longer provide care and they could not move her. Husband states there is no way he can care for her and he is desperately seeking help and having her placed into a nursing facility. States he does not feel he could take her home today. As the patient's workup is largely normal besides a urinary tract infection we will cover with antibiotics, while awaiting social work consultation tomorrow.   Patient continues to desat into the 80s on room air repeat temperatures 100.7. Chest x-ray shows vascular congestion. Influenza swab is negative. Urinalysis consistent with urinary tract infection. We will cover with antibiotics, start Lasix and admitted to the hospital.     ____________________________________________   FINAL CLINICAL IMPRESSION(S) / ED DIAGNOSES  Nausea vomiting diarrhea UTI pulmonary edema fever   Harvest Dark, MD 02/24/16 2131

## 2016-02-24 NOTE — ED Notes (Signed)
Fall safety alarm was placed on pt. Fall mats were placed for pt per RN request

## 2016-02-24 NOTE — ED Notes (Addendum)
Pt took meds whole and drank cup of water. Husband made aware of patient being admitted to the floor.

## 2016-02-24 NOTE — H&P (Signed)
[place] [facility] [place] NAME: Deborah [name]    MR#:  [id number]  DATE OF BIRTH:  07/10/1930  DATE OF ADMISSION:  02/24/2016  PRIMARY CARE PHYSICIAN: Deborah Durie, MD   REQUESTING/REFERRING PHYSICIAN: Kerman Passey, MD  CHIEF COMPLAINT:   Chief Complaint  Patient presents with  . Nausea  . Emesis  . Diarrhea    HISTORY OF PRESENT ILLNESS:  Deborah [name]  is a 83 y.o. female who presents with Nausea vomiting and diarrhea. On evaluation in the ED she was found to have sepsis due to urinary tract infection. Hospitals were called for admission.  PAST MEDICAL HISTORY:   Past Medical History:  Diagnosis Date  . Anxiety   . Chronic kidney disease, stage III (moderate)   . Contracture of palmar fascia   . Cough   . Diabetes mellitus   . Hyperlipidemia   . Hypertension   . Obesity   . Osteoarthritis   . Other specified acquired hypothyroidism   . Otitis externa   . Palpitations   . Peripheral neuropathy ([place])   . Pure hypercholesterolemia   . Secondary hyperparathyroidism (of renal origin)   . Sleep apnea   . Unspecified adjustment reaction   . Unspecified hereditary and idiopathic peripheral neuropathy   . Unspecified urinary incontinence     PAST SURGICAL HISTORY:   Past Surgical History:  Procedure Laterality Date  . BACK SURGERY  1990   Ruptured Disc.  [facility] Kitchen CARDIAC CATHETERIZATION  03/2008  . COLONOSCOPY    . HAND SURGERY Right   . REPLACEMENT TOTAL KNEE Bilateral   . SHOULDER SURGERY Right 06/2007    SOCIAL HISTORY:   Social History  Substance Use Topics  . Smoking status: Former Smoker    Packs/day: 10.00    Years: 1.00    Types: Cigarettes    Quit date: 12/27/1997  . Smokeless tobacco: Never Used  . Alcohol use No    FAMILY HISTORY:   Family History  Problem Relation Age of Onset  . Heart attack Father   . Hypertension Sister   . Hypertension Sister   . Alzheimer's disease Mother     DRUG  ALLERGIES:   Allergies  Allergen Reactions  . Ace Inhibitors     cough  . Ciprofloxacin Hcl   . Quinolones Other (See Comments)    Tendon rupture    MEDICATIONS AT HOME:   Prior to Admission medications   Medication Sig Start Date End Date Taking? Authorizing Provider  acetaminophen (TYLENOL) 500 MG tablet Take 500 mg by mouth every 6 (six) hours as needed.    Historical Provider, MD  amLODipine (NORVASC) 5 MG tablet Take 5 mg by mouth daily.    Historical Provider, MD  budesonide (ENTOCORT EC) 3 MG 24 hr capsule Take 2 capsules (6 mg total) by mouth daily. 07/05/15   Margarita Rana, MD  buPROPion [facility] SR) 150 MG 12 hr tablet TAKE 1 TABLET BY MOUTH 2 TIMES DAILY 07/25/15   Margarita Rana, MD  cephALEXin [facility]) 500 MG capsule  12/14/15   Historical Provider, MD  Cetirizine HCl 10 MG CAPS Take 1 capsule by mouth daily. Reported on 08/17/2015 06/15/13   Historical Provider, MD  doxycycline (VIBRA-TABS) 100 MG tablet Take 1 tablet (100 mg total) by mouth 2 (two) times daily. 01/19/16   Richard Maceo Pro., MD  escitalopram (LEXAPRO) 20 MG tablet Take 1 tablet (20 mg total) by mouth daily. 12/17/14   Margarita Rana,  MD  glipiZIDE (GLUCOTROL) 5 MG tablet TAKE 1 TABLET BY MOUTH ONCE A DAY 07/25/15   Margarita Rana, MD  imipramine (TOFRANIL) 25 MG tablet Take 1 tablet (25 mg total) by mouth 2 (two) times daily. Patient taking differently: Take 25 mg by mouth at bedtime.  10/07/15   Richard Maceo Pro., MD  LORazepam (ATIVAN) 0.5 MG tablet TAKE 1 TABLET BY MOUTH TWICE A DAY Patient taking differently: TAKE 1 TABLET BY MOUTH TWICE A DAY as needed 01/31/15   Margarita Rana, MD  montelukast (SINGULAIR) 10 MG tablet TAKE 1 TABLET BY MOUTH ONCE A DAY 12/14/15   Richard Maceo Pro., MD  nitrofurantoin (MACRODANTIN) 100 MG capsule Take 100 mg by mouth 2 (two) times daily.    Historical Provider, MD  nitrofurantoin, macrocrystal-monohydrate, (MACROBID) 100 MG capsule Take 1 capsule (100 mg total) by mouth  2 (two) times daily. 02/23/16   Richard Maceo Pro., MD  polyethylene glycol powder (GLYCOLAX/MIRALAX) powder Take 17 g by mouth daily. 05/31/15   Margarita Rana, MD  saccharomyces boulardii (FLORASTOR) 250 MG capsule Take 250 mg by mouth 2 (two) times daily.    Historical Provider, MD  simvastatin (ZOCOR) 20 MG tablet Take 20 mg by mouth at bedtime. Reported on 08/17/2015 07/18/15   Margarita Rana, MD  triamcinolone cream (KENALOG) 0.1 % Apply 1 application topically 2 (two) times daily. Right lateral shin area 01/12/16   Dinah C Ngetich, NP    REVIEW OF SYSTEMS:  Review of Systems  Unable to perform ROS: Acuity of condition     VITAL SIGNS:   Vitals:   02/24/16 2103 02/24/16 2105 02/24/16 2130 02/24/16 2211  BP:  127/68 128/89 (!) 95/52  Pulse: 90 92 89 89  Resp: 20 20 (!) 22 19  Temp:  (!) 100.7 F (38.2 C)    TempSrc:  Oral    SpO2: (!) 88% 96% 94% 92%  Weight:       Wt Readings from Last 3 Encounters:  02/24/16 (!) 153.8 kg (339 lb)  01/12/16 (!) 154 kg (339 lb 9.6 oz)  01/04/16 (!) 150.2 kg (331 lb 3.2 oz)    PHYSICAL EXAMINATION:  Physical Exam  Vitals reviewed. Constitutional: She appears well-developed and well-nourished. No distress.  HENT:  Head: Normocephalic and atraumatic.  Mouth/Throat: Oropharynx is clear and moist.  Eyes: Conjunctivae and EOM are normal. Pupils are equal, round, and reactive to light. No scleral icterus.  Neck: Normal range of motion. Neck supple. No JVD present. No thyromegaly present.  Cardiovascular: Normal rate, regular rhythm and intact distal pulses.  Exam reveals no gallop and no friction rub.   No murmur heard. Respiratory: Effort normal and breath sounds normal. No respiratory distress. She has no wheezes. She has no rales.  GI: Soft. Bowel sounds are normal. She exhibits no distension. There is no tenderness.  Musculoskeletal: Normal range of motion. She exhibits no edema.  No arthritis, no gout  Lymphadenopathy:    She has no  cervical adenopathy.  Neurological: She is alert. No cranial nerve deficit.  Unable to fully assess due to patient condition  Skin: Skin is warm and dry. No rash noted. No erythema.  Psychiatric:  Unable to assess due to patient condition    LABORATORY PANEL:   CBC  Recent Labs Lab 02/24/16 1808  WBC 8.5  HGB 14.1  HCT 43.3  PLT 182   ------------------------------------------------------------------------------------------------------------------  Chemistries   Recent Labs Lab 02/24/16 1808  NA 139  K 4.0  CL 102  CO2 26  GLUCOSE 166*  BUN 15  CREATININE 1.21*  CALCIUM 8.8*  AST 23  ALT 13*  ALKPHOS 63  BILITOT 1.5*   ------------------------------------------------------------------------------------------------------------------  Cardiac Enzymes  Recent Labs Lab 02/24/16 1808  TROPONINI <0.03   ------------------------------------------------------------------------------------------------------------------  RADIOLOGY:  Dg Chest Portable 1 View  Result Date: 02/24/2016 CLINICAL DATA:  Hypoxia. EXAM: PORTABLE CHEST 1 VIEW COMPARISON:  12/14/2015 FINDINGS: 2106 hours. The cardio pericardial silhouette is enlarged. There is pulmonary vascular congestion without overt pulmonary edema. No focal airspace consolidation. No pleural effusion. The visualized bony structures of the thorax are intact. Telemetry leads overlie the chest. IMPRESSION: Cardiomegaly with vascular congestion. Electronically Signed   By: Misty Stanley M.D.   On: 02/24/2016 21:20    EKG:   Orders placed or performed during the hospital encounter of 02/24/16  . EKG 12-Lead  . EKG 12-Lead    IMPRESSION AND PLAN:  Principal Problem:   Sepsis ([place]) - IV antibiotics given in the ED and continued on admission. Patient was nitrite positive on UA. Urine culture sent, blood culture sent. She is hemodynamically stable. Active Problems:   UTI (urinary tract infection) - antibiotics and cultures  as above   Anxiety - home dose anxiolytics   Benign hypertension - stable, continue home meds   Controlled type 2 diabetes mellitus (HCC) - sliding scale insulin with corresponding glucose checks   Chronic kidney disease (CKD), stage III (moderate) - at baseline, avoid nephrotoxins and monitor  All the records are reviewed and case discussed with ED provider. Management plans discussed with the patient and/or family.  DVT PROPHYLAXIS: SubQ heparin  GI PROPHYLAXIS: None  ADMISSION STATUS: Inpatient  CODE STATUS: Full Code Status History    This patient does not have a recorded code status. Please follow your organizational policy for patients in this situation.      TOTAL TIME TAKING CARE OF THIS PATIENT: 45 minutes.    Mariaeduarda Defranco [place] 02/24/2016, 10:16 PM  [facility]  Office  [phone]  CC: Primary care physician; Deborah Durie, MD

## 2016-02-24 NOTE — ED Notes (Addendum)
Bed alarm, Floor pads placed tdue to high fall risk. Husband leaving at this time and taking all her belongings. Offered something to eat and drink pt refused.

## 2016-02-24 NOTE — ED Notes (Signed)
Hooked pt up to monitor.

## 2016-02-24 NOTE — Telephone Encounter (Signed)
Ria Comment states she was not able to get into the home this week and will continue to try.  Ria Comment stated pt keeps having UTI's and wants to see if pt can be referred to urology.

## 2016-02-24 NOTE — ED Notes (Signed)
Patient husband stated to this RN that for the past 6 months patients condition has been declining. Patient husband states that patient has fallen 3 times in the last 2 weeks. Patient has dementia and husband states that he is unable to care for patient any longer at home and would like to get a social worker consult to have patient placed.   Dr. Kerman Passey notified of above.

## 2016-02-24 NOTE — ED Notes (Signed)
Attempting to wean o2 at this time per Dr. Kerman Passey

## 2016-02-24 NOTE — ED Notes (Signed)
Pt has had three loose stool incontinet of b/b

## 2016-02-24 NOTE — ED Triage Notes (Signed)
Patient to ED via EMS for N/V/D x 3 days. Patient states that she has not had any OTC medications. Patient in NAD at this time.

## 2016-02-24 NOTE — ED Notes (Signed)
Per angela rn pt was placed on 02 at 2L via  [place] due to sat dropping to 88%

## 2016-02-25 ENCOUNTER — Inpatient Hospital Stay (HOSPITAL_COMMUNITY)
Admit: 2016-02-25 | Discharge: 2016-02-25 | Disposition: A | Discharge status: Home / Self Care | Payer: Medicare Other | Attending: Internal Medicine | Admitting: Internal Medicine

## 2016-02-25 DIAGNOSIS — I509 Heart failure, unspecified: Secondary | ICD-10-CM

## 2016-02-25 LAB — BASIC METABOLIC PANEL
Anion gap: 8 (ref 5–15)
BUN: 18 mg/dL (ref 6–20)
CALCIUM: 8.2 mg/dL — AB (ref 8.9–10.3)
CO2: 28 mmol/L (ref 22–32)
CREATININE: 1.26 mg/dL — AB (ref 0.44–1.00)
Chloride: 104 mmol/L (ref 101–111)
GFR calc Af Amer: 44 mL/min — ABNORMAL LOW (ref >60)
GFR, EST NON AFRICAN AMERICAN: 38 mL/min — AB (ref >60)
Glucose, Bld: 86 mg/dL (ref 65–99)
Potassium: 2.5 mmol/L — CL (ref 3.5–5.1)
SODIUM: 140 mmol/L (ref 135–145)

## 2016-02-25 LAB — MAGNESIUM: MAGNESIUM: 1.7 mg/dL (ref 1.7–2.4)

## 2016-02-25 LAB — GLUCOSE, CAPILLARY
GLUCOSE-CAPILLARY: 82 mg/dL (ref 65–99)
Glucose-Capillary: 123 mg/dL — ABNORMAL HIGH (ref 65–99)
Glucose-Capillary: 70 mg/dL (ref 65–99)
Glucose-Capillary: 74 mg/dL (ref 65–99)
Glucose-Capillary: 86 mg/dL (ref 65–99)

## 2016-02-25 LAB — CBC
HCT: 36.9 % (ref 35.0–47.0)
Hemoglobin: 12.2 g/dL (ref 12.0–16.0)
MCH: 28.2 pg (ref 26.0–34.0)
MCHC: 33.2 g/dL (ref 32.0–36.0)
MCV: 85.2 fL (ref 80.0–100.0)
PLATELETS: 152 10*3/uL (ref 150–440)
RBC: 4.34 MIL/uL (ref 3.80–5.20)
RDW: 16.8 % — AB (ref 11.5–14.5)
WBC: 4 10*3/uL (ref 3.6–11.0)

## 2016-02-25 LAB — ECHOCARDIOGRAM COMPLETE
Height: 66 in
WEIGHTICAEL: 5292.8 [oz_av]

## 2016-02-25 LAB — AMMONIA: Ammonia: 11 umol/L (ref 9–35)

## 2016-02-25 MED ORDER — BUPROPION HCL ER (SR) 150 MG PO TB12
150.0000 mg | ORAL_TABLET | Freq: Two times a day (BID) | ORAL | Status: DC
  Administered 2016-02-25 – 2016-03-01 (×12): 150 mg via ORAL
  Filled 2016-02-25 (×13): qty 1

## 2016-02-25 MED ORDER — ONDANSETRON HCL 4 MG PO TABS: 4.0000 mg | ORAL_TABLET | Freq: Four times a day (QID) | ORAL | Status: DC | PRN

## 2016-02-25 MED ORDER — IMIPRAMINE HCL 25 MG PO TABS
25.0000 mg | ORAL_TABLET | Freq: Every day | ORAL | Status: DC
  Administered 2016-02-25 – 2016-03-01 (×7): 25 mg via ORAL
  Filled 2016-02-25 (×8): qty 1

## 2016-02-25 MED ORDER — SODIUM CHLORIDE 0.9% FLUSH
3.0000 mL | Freq: Two times a day (BID) | INTRAVENOUS | Status: DC
  Administered 2016-02-25 – 2016-03-02 (×16): 3 mL via INTRAVENOUS

## 2016-02-25 MED ORDER — ESCITALOPRAM OXALATE 10 MG PO TABS
20.0000 mg | ORAL_TABLET | Freq: Every day | ORAL | Status: DC
  Administered 2016-02-25 – 2016-03-02 (×7): 20 mg via ORAL
  Filled 2016-02-25 (×7): qty 2

## 2016-02-25 MED ORDER — PERFLUTREN LIPID MICROSPHERE
1.0000 mL | INTRAVENOUS | Status: AC | PRN
  Administered 2016-02-25: 7 mL via INTRAVENOUS
  Filled 2016-02-25: qty 10

## 2016-02-25 MED ORDER — INSULIN ASPART 100 UNIT/ML ~~LOC~~ SOLN: 0.0000 [IU] | Freq: Every day | SUBCUTANEOUS | Status: DC

## 2016-02-25 MED ORDER — SACCHAROMYCES BOULARDII 250 MG PO CAPS
250.0000 mg | ORAL_CAPSULE | Freq: Two times a day (BID) | ORAL | Status: DC
  Administered 2016-02-25 – 2016-03-02 (×14): 250 mg via ORAL
  Filled 2016-02-25 (×15): qty 1

## 2016-02-25 MED ORDER — ACETAMINOPHEN 650 MG RE SUPP: 650.0000 mg | Freq: Four times a day (QID) | RECTAL | Status: DC | PRN

## 2016-02-25 MED ORDER — LORAZEPAM 1 MG PO TABS
1.0000 mg | ORAL_TABLET | Freq: Three times a day (TID) | ORAL | Status: DC | PRN
  Administered 2016-02-26 (×3): 1 mg via ORAL
  Filled 2016-02-25 (×3): qty 1

## 2016-02-25 MED ORDER — LOPERAMIDE HCL 2 MG PO CAPS
2.0000 mg | ORAL_CAPSULE | Freq: Four times a day (QID) | ORAL | Status: DC | PRN
  Administered 2016-02-25 – 2016-02-26 (×3): 2 mg via ORAL
  Filled 2016-02-25 (×3): qty 1

## 2016-02-25 MED ORDER — MEROPENEM-SODIUM CHLORIDE 1 GM/50ML IV SOLR
1.0000 g | Freq: Three times a day (TID) | INTRAVENOUS | Status: DC
  Administered 2016-02-25 – 2016-02-28 (×11): 1 g via INTRAVENOUS
  Filled 2016-02-25 (×13): qty 50

## 2016-02-25 MED ORDER — HEPARIN SODIUM (PORCINE) 5000 UNIT/ML IJ SOLN
5000.0000 [IU] | Freq: Three times a day (TID) | INTRAMUSCULAR | Status: DC
  Administered 2016-02-25 – 2016-03-02 (×18): 5000 [IU] via SUBCUTANEOUS
  Filled 2016-02-25 (×20): qty 1

## 2016-02-25 MED ORDER — INSULIN ASPART 100 UNIT/ML ~~LOC~~ SOLN: 0.0000 [IU] | Freq: Three times a day (TID) | SUBCUTANEOUS | Status: DC

## 2016-02-25 MED ORDER — ACETAMINOPHEN 325 MG PO TABS
650.0000 mg | ORAL_TABLET | Freq: Four times a day (QID) | ORAL | Status: DC | PRN
Start: 2016-02-25 — End: 2016-03-02

## 2016-02-25 MED ORDER — POLYETHYLENE GLYCOL 3350 17 G PO PACK: 17.0000 g | PACK | Freq: Every day | ORAL | Status: DC

## 2016-02-25 MED ORDER — MEROPENEM-SODIUM CHLORIDE 500 MG/50ML IV SOLR
500.0000 mg | Freq: Three times a day (TID) | INTRAVENOUS | Status: DC
  Filled 2016-02-25 (×2): qty 50

## 2016-02-25 MED ORDER — SODIUM CHLORIDE 0.9 % IV SOLN
INTRAVENOUS | Status: DC
  Administered 2016-02-25: 01:00:00 via INTRAVENOUS

## 2016-02-25 MED ORDER — LORAZEPAM 0.5 MG PO TABS: 0.5000 mg | ORAL_TABLET | Freq: Two times a day (BID) | ORAL | Status: DC | PRN

## 2016-02-25 MED ORDER — POTASSIUM CHLORIDE CRYS ER 20 MEQ PO TBCR: 40.0000 meq | EXTENDED_RELEASE_TABLET | ORAL | Status: DC

## 2016-02-25 MED ORDER — SODIUM CHLORIDE 0.9 % IV SOLN
30.0000 meq | Freq: Once | INTRAVENOUS | Status: AC
  Administered 2016-02-25: 10:00:00 30 meq via INTRAVENOUS
  Filled 2016-02-25: qty 15

## 2016-02-25 MED ORDER — SIMVASTATIN 20 MG PO TABS
20.0000 mg | ORAL_TABLET | Freq: Every day | ORAL | Status: DC
  Administered 2016-02-25 – 2016-03-01 (×7): 20 mg via ORAL
  Filled 2016-02-25 (×7): qty 1

## 2016-02-25 MED ORDER — ONDANSETRON HCL 4 MG/2ML IJ SOLN: 4.0000 mg | Freq: Four times a day (QID) | INTRAMUSCULAR | Status: DC | PRN

## 2016-02-25 MED ORDER — POTASSIUM CHLORIDE CRYS ER 20 MEQ PO TBCR
40.0000 meq | EXTENDED_RELEASE_TABLET | ORAL | Status: AC
  Administered 2016-02-25 (×2): 40 meq via ORAL
  Filled 2016-02-25 (×2): qty 2

## 2016-02-25 MED ORDER — ORAL CARE MOUTH RINSE
15.0000 mL | Freq: Two times a day (BID) | OROMUCOSAL | Status: DC
  Administered 2016-02-25 – 2016-03-01 (×12): 15 mL via OROMUCOSAL

## 2016-02-25 MED ORDER — ENSURE ENLIVE PO LIQD
237.0000 mL | Freq: Two times a day (BID) | ORAL | Status: DC
  Administered 2016-02-25 – 2016-03-02 (×10): 237 mL via ORAL

## 2016-02-25 MED ORDER — MONTELUKAST SODIUM 10 MG PO TABS
10.0000 mg | ORAL_TABLET | Freq: Every day | ORAL | Status: DC
  Administered 2016-02-25 – 2016-03-02 (×7): 10 mg via ORAL
  Filled 2016-02-25 (×7): qty 1

## 2016-02-25 MED ORDER — BUDESONIDE 3 MG PO CPEP
6.0000 mg | ORAL_CAPSULE | Freq: Every day | ORAL | Status: DC
  Administered 2016-02-25 – 2016-03-02 (×7): 6 mg via ORAL
  Filled 2016-02-25 (×7): qty 2

## 2016-02-25 MED ORDER — POLYETHYLENE GLYCOL 3350 17 GM/SCOOP PO POWD
17.0000 g | Freq: Every day | ORAL | Status: DC
  Filled 2016-02-25: qty 255

## 2016-02-25 NOTE — Progress Notes (Signed)
*  PRELIMINARY RESULTS* Echocardiogram 2D Echocardiogram has been performed. Definity Contrast was used on this study.  Deborah [name] 02/25/2016, 1:54 PM

## 2016-02-25 NOTE — Progress Notes (Signed)
Deborah [name] at [place] NAME: Deborah [name]    MR#:  [id number]  DATE OF BIRTH:  December 27, 1930  SUBJECTIVE:  CHIEF COMPLAINT:   Chief Complaint  Patient presents with  . Nausea  . Emesis  . Diarrhea   -Patient admitted from home with multiple falls. Has underlying dementia. -Continues to have diarrhea.  REVIEW OF SYSTEMS:  Review of Systems  Unable to perform ROS: Dementia    DRUG ALLERGIES:   Allergies  Allergen Reactions  . Ace Inhibitors Cough  . Ciprofloxacin Hcl Other (See Comments)    Reaction: unknown  . Quinolones Other (See Comments)    Reaction: Tendon rupture    VITALS:  Blood pressure (!) 116/59, pulse 74, temperature 98.6 F (37 C), temperature source Oral, resp. rate 20, height 5' 6"  (1.676 m), weight (!) 150 kg (330 lb 12.8 oz), SpO2 91 %.  PHYSICAL EXAMINATION:  Physical Exam   GENERAL:  83 y.o.-year-old obese patient lying in the bed with no acute distress.  EYES: Pupils equal, round, reactive to light and accommodation. No scleral icterus. Extraocular muscles intact.  HEENT: Head atraumatic, normocephalic. Oropharynx and nasopharynx clear.  NECK:  Supple, no jugular venous distention. No thyroid enlargement, no tenderness.  LUNGS: Normal breath sounds bilaterally, no wheezing, rales,rhonchi or crepitation. No use of accessory muscles of respiration.  CARDIOVASCULAR: S1, S2 normal. No murmurs, rubs, or gallops.  ABDOMEN: Soft, obese abdomen, nontender, nondistended. Bowel sounds present. No organomegaly or mass.  EXTREMITIES: No pedal edema, cyanosis, or clubbing.  NEUROLOGIC: No new focal neurological deficits, follows commands PSYCHIATRIC: The patient is alert and oriented to self.  SKIN: No obvious rash, lesion, or ulcer.    LABORATORY PANEL:   CBC  Recent Labs Lab 02/25/16 0735  WBC 4.0  HGB 12.2  HCT 36.9  PLT 152    ------------------------------------------------------------------------------------------------------------------  Chemistries   Recent Labs Lab 02/24/16 1808 02/25/16 0735  NA 139 140  K 4.0 2.5*  CL 102 104  CO2 26 28  GLUCOSE 166* 86  BUN 15 18  CREATININE 1.21* 1.26*  CALCIUM 8.8* 8.2*  MG  --  1.7  AST 23  --   ALT 13*  --   ALKPHOS 63  --   BILITOT 1.5*  --    ------------------------------------------------------------------------------------------------------------------  Cardiac Enzymes  Recent Labs Lab 02/24/16 1808  TROPONINI <0.03   ------------------------------------------------------------------------------------------------------------------  RADIOLOGY:  Dg Chest Portable 1 View  Result Date: 02/24/2016 CLINICAL DATA:  Hypoxia. EXAM: PORTABLE CHEST 1 VIEW COMPARISON:  12/14/2015 FINDINGS: 2106 hours. The cardio pericardial silhouette is enlarged. There is pulmonary vascular congestion without overt pulmonary edema. No focal airspace consolidation. No pleural effusion. The visualized bony structures of the thorax are intact. Telemetry leads overlie the chest. IMPRESSION: Cardiomegaly with vascular congestion. Electronically Signed   By: Misty Stanley M.D.   On: 02/24/2016 21:20    EKG:   Orders placed or performed during the hospital encounter of 02/24/16  . EKG 12-Lead  . EKG 12-Lead    ASSESSMENT AND PLAN:   83 year old female with past medical history significant for dementia, anxiety and depression, hypertension, diabetes, CK D, neuropathy presents from home secondary to nausea vomiting and diarrhea and noted to have multiple falls.  #1 nausea vomiting/diarrhea-C. difficile is negative. Nausea vomiting has resolved. -On liquid diet at this time. Continues to have diarrhea. Check GI panel. -Imodium as needed.  #2 acute cystitis-urine cultures are pending. Continue meropenem  #3 hypokalemia-secondary to  worsening diarrhea. Being replaced  appropriately.  #4 altered mental status-likely metabolic encephalopathy from infection and also underlying dementia worsening. -Check ammonia level. No focal deficits. Continue to monitor closely  #5 depression and anxiety-continue home medications. Patient on Wellbutrin and Lexapro. Also use Ativan as needed  #6 hypertension-blood pressure is normal. Continue to hold Norvasc  #7 diabetes mellitus-hold glipizide as poor oral intake and sugars have been on low normal side. Continue sliding scale insulin.  #8 DVT prophylaxis-on subcutaneous heparin  Physical therapy consulted. Family interested in placement. Social worker aware.   Discussed with husband today.  All the records are reviewed and case discussed with Care Management/Social Workerr. Management plans discussed with the patient, family and they are in agreement.  CODE STATUS: Full Code  TOTAL TIME TAKING CARE OF THIS PATIENT: 37 minutes.   POSSIBLE D/C IN 2 DAYS, DEPENDING ON CLINICAL CONDITION.   Gladstone Lighter M.D on 02/25/2016 at 1:29 PM  Between 7am to 6pm - Pager - [phone]  After 6pm go to [email or web address] - password [facility]  Office  [phone]  CC: Primary care physician; Wilhemena Durie, MD

## 2016-02-25 NOTE — Progress Notes (Signed)
Notified by lab that stool positive for noravirus. Pt was immediately placed in contact enteric precautions with visiting son/pt informed with isolation precaution education done with teach back by son. Husband left to go home with nausea and "sick" with son updating pt's husband of positive noravirus. Son states he dad replied he has the same thing pt has. Education done.

## 2016-02-25 NOTE — Clinical Social Work Note (Signed)
Clinical Social Work Assessment  Patient Details  Name: Deborah [name] MRN: [id number] Date of Birth: 1931/01/23  Date of referral:  02/25/16               Reason for consult:  Facility Placement                Permission sought to share information with:  Chartered certified accountant granted to share information::  Yes, Verbal Permission Granted  Name::        Agency::     Relationship::     Contact Information:     Housing/Transportation Living arrangements for the past 2 months:  Single Family Home Source of Information:  Spouse Patient Interpreter Needed:  None Criminal Activity/Legal Involvement Pertinent to Current Situation/Hospitalization:  No - Comment as needed Significant Relationships:  Adult Children, Spouse Lives with:  Spouse Do you feel safe going back to the place where you live?  Yes (The patient feels safe. The patient's spouse does not feel that she will be safe.) Need for family participation in patient care:  Yes (Comment)  Care giving concerns:  Patient's spouse requested CSW assist with ALF placement.   Social Worker assessment / plan:  CSW visited the patient's spouse in the room while the patient was in a procedure. Herbie Baltimore gave verbal permission to send ALF referral to all [place] ALFs that have memory care abilities. In the past 6 months, the patient has had 27 falls according to [place], and she has presented with significant memory decline. Herbie Baltimore is himself an older adult and feels that he is no longer able to care for her in the home. Until last week, Jack was visiting for [place]; however, that has been discontinued due to lack of progress after 20 weeks of care. Prior to [place], the patient was at [facility] for [place] where Herbie Baltimore felt she was not cared for at all.  The client is dependent with all ADLs except for feeding. The client is dependent with all IADLs.  CSW advised Herbie Baltimore about costs for ALFs and offered financial  counseling information as well as a list of area facilities so that he could call and/or visit to assist with making choices.    Employment status:  Retired Nurse, adult PT Recommendations:  Not assessed at this time Information / Referral to community resources:  Other (Comment Required) (ALF)  Patient/Family's Response to care:  Herbie Baltimore thanked CSW for assistance.  Patient/Family's Understanding of and Emotional Response to Diagnosis, Current Treatment, and Prognosis:  Herbie Baltimore and his family are aware of continued memory deficiencies and are in agreement with considering ALF placement.  Emotional Assessment Appearance:   (Patient was in a procedure.) Attitude/Demeanor/Rapport:   (Patient was in a procedure.) Affect (typically observed):   (Patient was in a procedure.) Orientation:  Oriented to Self Alcohol / Substance use:  Never Used Psych involvement (Current and /or in the community):  No (Comment)  Discharge Needs  Concerns to be addressed:  Decision making concerns, Cognitive Concerns, Financial / Insurance Concerns, Care Coordination, Discharge Planning Concerns Readmission within the last 30 days:  No Current discharge risk:  Chronically ill Barriers to Discharge:  Continued Medical Work up   [employer], LCSW 02/25/2016, 2:24 PM

## 2016-02-25 NOTE — Clinical Social Work Note (Signed)
CSW received consult about possible placement for this patient. CSW is following.  Santiago Bumpers, MSW, Latanya Presser [phone]

## 2016-02-25 NOTE — Progress Notes (Signed)
ANTIBIOTIC CONSULT NOTE - INITIAL  Pharmacy Consult for Meropenem Indication: UTI  Allergies  Allergen Reactions  . Ace Inhibitors Cough  . Ciprofloxacin Hcl Other (See Comments)    Reaction: unknown  . Quinolones Other (See Comments)    Reaction: Tendon rupture    Patient Measurements: Weight: (!) 339 lb (153.8 kg) Adjusted Body Weight:   Vital Signs: Temp: 99.7 F (37.6 C) (01/13 0007) Temp Source: Oral (01/13 0007) BP: 109/46 (01/13 0007) Pulse Rate: 85 (01/13 0007) Intake/Output from previous day: No intake/output data recorded. Intake/Output from this shift: No intake/output data recorded.  Labs:  Recent Labs  02/24/16 1808  WBC 8.5  HGB 14.1  PLT 182  CREATININE 1.21*   Estimated Creatinine Clearance: 56.3 mL/min (by C-G formula based on SCr of 1.21 mg/dL (H)). No results for input(s): VANCOTROUGH, VANCOPEAK, VANCORANDOM, GENTTROUGH, GENTPEAK, GENTRANDOM, TOBRATROUGH, TOBRAPEAK, TOBRARND, AMIKACINPEAK, AMIKACINTROU, AMIKACIN in the last 72 hours.   Microbiology: Recent Results (from the past 720 hour(s))  C difficile quick scan w PCR reflex     Status: None   Collection Time: 02/24/16 10:59 PM  Result Value Ref Range Status   C Diff antigen NEGATIVE NEGATIVE Final   C Diff toxin NEGATIVE NEGATIVE Final   C Diff interpretation No C. difficile detected.  Final    Medical History: Past Medical History:  Diagnosis Date  . Anxiety   . Chronic kidney disease, stage III (moderate)   . Contracture of palmar fascia   . Cough   . Diabetes mellitus   . Hyperlipidemia   . Hypertension   . Obesity   . Osteoarthritis   . Other specified acquired hypothyroidism   . Otitis externa   . Palpitations   . Peripheral neuropathy ([place])   . Pure hypercholesterolemia   . Secondary hyperparathyroidism (of renal origin)   . Sleep apnea   . Unspecified adjustment reaction   . Unspecified hereditary and idiopathic peripheral neuropathy   . Unspecified urinary  incontinence     Medications:  Prescriptions Prior to Admission  Medication Sig Dispense Refill Last Dose  . acetaminophen (TYLENOL) 500 MG tablet Take 500 mg by mouth every 6 (six) hours as needed for mild pain.    prn at prn  . amLODipine (NORVASC) 5 MG tablet Take 5 mg by mouth 2 (two) times daily.    02/24/2016 at Unknown time  . aspirin EC 81 MG tablet Take 81 mg by mouth at bedtime.   02/23/2016 at Unknown time  . budesonide (ENTOCORT EC) 3 MG 24 hr capsule Take 2 capsules (6 mg total) by mouth daily. (Patient taking differently: Take 3 mg by mouth 2 (two) times daily. ) 60 capsule 5 02/24/2016 at Unknown time  . buPROPion (WELLBUTRIN SR) 150 MG 12 hr tablet TAKE 1 TABLET BY MOUTH 2 TIMES DAILY 60 tablet 5 02/24/2016 at Unknown time  . cetirizine (ZYRTEC) 10 MG tablet Take 10 mg by mouth at bedtime. Reported on 08/17/2015   02/23/2016 at Unknown time  . escitalopram (LEXAPRO) 20 MG tablet Take 1 tablet (20 mg total) by mouth daily. 30 tablet 5 02/24/2016 at Unknown time  . glipiZIDE (GLUCOTROL) 5 MG tablet TAKE 1 TABLET BY MOUTH ONCE A DAY 90 tablet 3 02/24/2016 at Unknown time  . imipramine (TOFRANIL) 25 MG tablet Take 1 tablet (25 mg total) by mouth 2 (two) times daily. (Patient taking differently: Take 25 mg by mouth at bedtime. ) 60 tablet 5 02/23/2016 at Unknown time  . LORazepam (ATIVAN)  0.5 MG tablet Take 0.5 mg by mouth 2 (two) times daily as needed for anxiety.   prn at prn  . nitrofurantoin, macrocrystal-monohydrate, (MACROBID) 100 MG capsule Take 1 capsule (100 mg total) by mouth 2 (two) times daily. 10 capsule 0 unknown at unknown  . simvastatin (ZOCOR) 20 MG tablet Take 20 mg by mouth at bedtime. Reported on 08/17/2015 1 tablet 1 02/23/2016 at Unknown time   Assessment: Urine Cx growing E Coli sens to imipenem.  CrCl = 56.3 ml/min   Goal of Therapy:  resolution of infection  Plan:  Expected duration 7 days with resolution of temperature and/or normalization of WBC   Meropenem 1 gm  IV Q8H ordered to start 1/13 @ 00:30.   Gladie Gravette D 02/25/2016,12:15 AM

## 2016-02-25 NOTE — Progress Notes (Signed)
Lab advised of critical K+ of 2.5. Paged Dr Tressia Miners with result. Will FU.

## 2016-02-25 NOTE — NC FL2 (Signed)
[place] LEVEL OF CARE SCREENING TOOL     IDENTIFICATION  Patient Name: Deborah [name] Birthdate: [birth year] Sex: female Admission Date (Current Location): 02/24/2016  [facility] and [place] Number:  Engineering geologist and Address:  [facility], [address] [place],  [address]      Provider Number: [id number]  Attending Physician Name and Address:  Gladstone Lighter, MD  Relative Name and Phone Number:       Current Level of Care: Hospital Recommended Level of Care: [place] Prior Approval Number:    Date Approved/Denied:   PASRR Number:    Discharge Plan: Other (Comment) (ALF)    Current Diagnoses: Patient Active Problem List   Diagnosis Date Noted  . Sepsis ([place]) 02/24/2016  . UTI (urinary tract infection) 02/24/2016  . Memory loss 07/18/2015  . Depression 01/12/2015  . Shoulder pain 12/17/2014  . Pain in shoulder 08/05/2014  . Gonalgia 07/21/2014  . History of knee surgery 07/21/2014  . Allergic rhinitis 07/16/2014  . Anxiety 07/16/2014  . Benign hypertension 07/16/2014  . Alteration in bowel elimination: incontinence 07/16/2014  . Chronic kidney disease (CKD), stage III (moderate) 07/16/2014  . Colitis 07/16/2014  . CN (constipation) 07/16/2014  . D (diarrhea) 07/16/2014  . Contracture of palmar fascia (Dupuytren's) 07/16/2014  . Hypercholesteremia 07/16/2014  . Hypoxia 07/16/2014  . Absence of bladder continence 07/16/2014  . Focal lymphocytic colitis 07/16/2014  . Extreme obesity ([place]) 07/16/2014  . Arthritis, degenerative 07/16/2014  . Disorder of peripheral nervous system ([place]) 07/16/2014  . Neuralgia neuritis, sciatic nerve 07/16/2014  . Hyperparathyroidism, secondary ([place]) 07/16/2014  . Apnea, sleep 07/16/2014  . Subclinical hypothyroidism 07/16/2014  . Controlled type 2 diabetes mellitus ([place]) 07/16/2014  . 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 Urinary incontinence without sensory  awareness 09/03/2012  . Palpitations 12/28/2010  . SOB (shortness of breath) 12/28/2010  . Overweight 08/02/2009    Orientation RESPIRATION BLADDER Height & Weight     Self  Normal Continent (Uses adult diapers PRN) Weight: (!) 330 lb 12.8 oz (150 kg) Height:  5' 6"  (167.6 cm)  BEHAVIORAL SYMPTOMS/MOOD NEUROLOGICAL BOWEL NUTRITION STATUS  Other (Comment) (Significant fall risk)   Incontinent, Continent (Uses adult diapers PRN)    AMBULATORY STATUS COMMUNICATION OF NEEDS Skin   Extensive Assist Verbally Normal                       Personal Care Assistance Level of Assistance  Bathing, Dressing Bathing Assistance: Maximum assistance   Dressing Assistance: Maximum assistance     Functional Limitations Info             SPECIAL CARE FACTORS FREQUENCY                       Contractures Contractures Info: Present    Additional Factors Info  Allergies   Allergies Info: Ace Inhibitors, Ciprofloxacin Hcl, Quinolones           Current Medications (02/25/2016):  This is the current hospital active medication list Current Facility-Administered Medications  Medication Dose Route Frequency Provider Last Rate Last Dose  . acetaminophen (TYLENOL) tablet 650 mg  650 mg Oral Q6H PRN Lance Coon, MD       Or  . acetaminophen (TYLENOL) suppository 650 mg  650 mg Rectal Q6H PRN Lance Coon, MD      . budesonide (ENTOCORT EC) 24 hr capsule 6 mg  6 mg Oral Daily Shanon Brow  Jannifer Franklin, MD   6 mg at 02/25/16 0859  . buPROPion [facility] SR) 12 hr tablet 150 mg  150 mg Oral BID Lance Coon, MD   150 mg at 02/25/16 0857  . escitalopram (LEXAPRO) tablet 20 mg  20 mg Oral Daily Lance Coon, MD   20 mg at 02/25/16 0858  . feeding supplement (ENSURE ENLIVE) (ENSURE ENLIVE) liquid 237 mL  237 mL Oral BID BM Gladstone Lighter, MD   237 mL at 02/25/16 1352  . heparin injection 5,000 Units  5,000 Units Subcutaneous Q8H Lance Coon, MD   5,000 Units at 02/25/16 1358  . imipramine  (TOFRANIL) tablet 25 mg  25 mg Oral QHS Lance Coon, MD   25 mg at 02/25/16 0041  . insulin aspart (novoLOG) injection 0-5 Units  0-5 Units Subcutaneous QHS Lance Coon, MD      . insulin aspart (novoLOG) injection 0-9 Units  0-9 Units Subcutaneous TID WC Lance Coon, MD      . loperamide (IMODIUM) capsule 2 mg  2 mg Oral Q6H PRN Gladstone Lighter, MD      . LORazepam (ATIVAN) tablet 1 mg  1 mg Oral TID PRN Gladstone Lighter, MD      . MEDLINE mouth rinse  15 mL Mouth Rinse BID Gladstone Lighter, MD   15 mL at 02/25/16 1012  . meropenem (MERREM) IVPB SOLR 1 g  1 g Intravenous Q8H Lance Coon, MD   1 g at 02/25/16 0900  . montelukast (SINGULAIR) tablet 10 mg  10 mg Oral Daily Lance Coon, MD   10 mg at 02/25/16 0908  . ondansetron (ZOFRAN) tablet 4 mg  4 mg Oral Q6H PRN Lance Coon, MD       Or  . ondansetron [facility]) injection 4 mg  4 mg Intravenous Q6H PRN Lance Coon, MD      . saccharomyces boulardii [facility]) capsule 250 mg  250 mg Oral BID Lance Coon, MD   250 mg at 02/25/16 0859  . simvastatin (ZOCOR) tablet 20 mg  20 mg Oral QHS Lance Coon, MD   20 mg at 02/25/16 0041  . sodium chloride flush (NS) 0.9 % injection 3 mL  3 mL Intravenous Q12H Lance Coon, MD   3 mL at 02/25/16 1006   Facility-Administered Medications Ordered in Other Encounters  Medication Dose Route Frequency Provider Last Rate Last Dose  . perflutren lipid microspheres (DEFINITY) IV suspension  1-10 mL Intravenous PRN Lance Coon, MD   7 mL at 02/25/16 1344     Discharge Medications: Please see discharge summary for a list of discharge medications.  Relevant Imaging Results:  Relevant Lab Results:   Additional Information SS# [id number]  Zettie Pho, LCSW

## 2016-02-25 NOTE — Progress Notes (Addendum)
Oral and IV K supplementation given and tolerated well. Pt remains confused except to self. Having watery diarrhea- will start imodium after stool specimen obtained if able. Husband with pt at long intervals. Has aversion to solid food-refuses and spits it out if assistance provided. MD notified with diet modified with supplements started. ECHO done. IVF's discontinued; 2+ pitting edema in LE's. Social work in. Pt observed to use flopping type motion to work herself to edge of bed starting she was going to get up; high risk for fall with pt educated to call and not attempt to get OOB. Social work in to visit pt/husband. Requires total care except to drink from cup with straw.

## 2016-02-26 LAB — GASTROINTESTINAL PANEL BY PCR, STOOL (REPLACES STOOL CULTURE)
ADENOVIRUS F40/41: NOT DETECTED
Astrovirus: NOT DETECTED
CRYPTOSPORIDIUM: NOT DETECTED
Campylobacter species: NOT DETECTED
Cyclospora cayetanensis: NOT DETECTED
ENTEROAGGREGATIVE E COLI (EAEC): NOT DETECTED
ENTEROPATHOGENIC E COLI (EPEC): NOT DETECTED
Entamoeba histolytica: NOT DETECTED
Enterotoxigenic E coli (ETEC): NOT DETECTED
GIARDIA LAMBLIA: NOT DETECTED
NOROVIRUS GI/GII: DETECTED — AB
Plesimonas shigelloides: NOT DETECTED
ROTAVIRUS A: NOT DETECTED
Salmonella species: NOT DETECTED
Sapovirus (I, II, IV, and V): NOT DETECTED
Shiga like toxin producing E coli (STEC): NOT DETECTED
Shigella/Enteroinvasive E coli (EIEC): NOT DETECTED
Vibrio cholerae: NOT DETECTED
Vibrio species: NOT DETECTED
YERSINIA ENTEROCOLITICA: NOT DETECTED

## 2016-02-26 LAB — BASIC METABOLIC PANEL
Anion gap: 7 (ref 5–15)
BUN: 16 mg/dL (ref 6–20)
CALCIUM: 8.8 mg/dL — AB (ref 8.9–10.3)
CO2: 30 mmol/L (ref 22–32)
CREATININE: 1.14 mg/dL — AB (ref 0.44–1.00)
Chloride: 103 mmol/L (ref 101–111)
GFR calc Af Amer: 50 mL/min — ABNORMAL LOW (ref >60)
GFR calc non Af Amer: 43 mL/min — ABNORMAL LOW (ref >60)
Glucose, Bld: 83 mg/dL (ref 65–99)
Potassium: 3.3 mmol/L — ABNORMAL LOW (ref 3.5–5.1)
Sodium: 140 mmol/L (ref 135–145)

## 2016-02-26 LAB — GLUCOSE, CAPILLARY
Glucose-Capillary: 70 mg/dL (ref 65–99)
Glucose-Capillary: 70 mg/dL (ref 65–99)
Glucose-Capillary: 77 mg/dL (ref 65–99)
Glucose-Capillary: 74 mg/dL (ref 65–99)
Glucose-Capillary: 90 mg/dL (ref 65–99)
Glucose-Capillary: 88 mg/dL (ref 65–99)

## 2016-02-26 MED ORDER — POTASSIUM CHLORIDE CRYS ER 20 MEQ PO TBCR
40.0000 meq | EXTENDED_RELEASE_TABLET | Freq: Once | ORAL | Status: AC
  Administered 2016-02-26: 12:00:00 40 meq via ORAL
  Filled 2016-02-26: qty 2

## 2016-02-26 MED ORDER — RISPERIDONE 0.5 MG PO TBDP
0.5000 mg | ORAL_TABLET | Freq: Two times a day (BID) | ORAL | Status: DC
  Administered 2016-02-26 – 2016-02-27 (×3): 0.5 mg via ORAL
  Filled 2016-02-26 (×3): qty 1

## 2016-02-26 NOTE — Progress Notes (Signed)
[place] NAME: Deborah [name]    MR#:  [id number]  DATE OF BIRTH:  October 28, 1930  SUBJECTIVE:  CHIEF COMPLAINT:   Chief Complaint  Patient presents with  . Nausea  . Emesis  . Diarrhea   -more alert but agitated today - complains of nausea/gagging, diarrhea is improving - norovirus positive on stool studies  REVIEW OF SYSTEMS:  Review of Systems  Unable to perform ROS: Dementia    DRUG ALLERGIES:   Allergies  Allergen Reactions  . Ace Inhibitors Cough  . Ciprofloxacin Hcl Other (See Comments)    Reaction: unknown  . Quinolones Other (See Comments)    Reaction: Tendon rupture    VITALS:  Blood pressure (!) 119/45, pulse 86, temperature 97.4 F (36.3 C), resp. rate (!) 24, height 5' 6"  (1.676 m), weight (!) 150 kg (330 lb 12.8 oz), SpO2 92 %.  PHYSICAL EXAMINATION:  Physical Exam   GENERAL:  83 y.o.-year-old obese patient lying in the bed with no acute distress.  EYES: Pupils equal, round, reactive to light and accommodation. No scleral icterus. Extraocular muscles intact.  HEENT: Head atraumatic, normocephalic. Oropharynx and nasopharynx clear.  NECK:  Supple, no jugular venous distention. No thyroid enlargement, no tenderness.  LUNGS: Normal breath sounds bilaterally, no wheezing, rales,rhonchi or crepitation. No use of accessory muscles of respiration.  CARDIOVASCULAR: S1, S2 normal. No murmurs, rubs, or gallops.  ABDOMEN: Soft, obese abdomen, nontender, nondistended. Bowel sounds present. No organomegaly or mass.  EXTREMITIES: No pedal edema, cyanosis, or clubbing.  NEUROLOGIC: No new focal neurological deficits, follows commands PSYCHIATRIC: The patient is alert and oriented to self.  SKIN: No obvious rash, lesion, or ulcer.    LABORATORY PANEL:   CBC  Recent Labs Lab 02/25/16 0735  WBC 4.0  HGB 12.2  HCT 36.9  PLT 152    ------------------------------------------------------------------------------------------------------------------  Chemistries   Recent Labs Lab 02/24/16 1808 02/25/16 0735 02/26/16 0946  NA 139 140 140  K 4.0 2.5* 3.3*  CL 102 104 103  CO2 26 28 30   GLUCOSE 166* 86 83  BUN 15 18 16   CREATININE 1.21* 1.26* 1.14*  CALCIUM 8.8* 8.2* 8.8*  MG  --  1.7  --   AST 23  --   --   ALT 13*  --   --   ALKPHOS 63  --   --   BILITOT 1.5*  --   --    ------------------------------------------------------------------------------------------------------------------  Cardiac Enzymes  Recent Labs Lab 02/24/16 1808  TROPONINI <0.03   ------------------------------------------------------------------------------------------------------------------  RADIOLOGY:  Dg Chest Portable 1 View  Result Date: 02/24/2016 CLINICAL DATA:  Hypoxia. EXAM: PORTABLE CHEST 1 VIEW COMPARISON:  12/14/2015 FINDINGS: 2106 hours. The cardio pericardial silhouette is enlarged. There is pulmonary vascular congestion without overt pulmonary edema. No focal airspace consolidation. No pleural effusion. The visualized bony structures of the thorax are intact. Telemetry leads overlie the chest. IMPRESSION: Cardiomegaly with vascular congestion. Electronically Signed   By: Misty Stanley M.D.   On: 02/24/2016 21:20    EKG:   Orders placed or performed during the hospital encounter of 02/24/16  . EKG 12-Lead  . EKG 12-Lead    ASSESSMENT AND PLAN:   83 year old female with past medical history significant for dementia, anxiety and depression, hypertension, diabetes, CK D, neuropathy presents from home secondary to nausea vomiting and diarrhea and noted to have multiple falls.  #1 Acute viral gastroenteritis- Still nauseous, vomiting is resolved. Also having some  loose stools. -C. difficile is negative. GI panel with no norovirus. Continue symptomatic treatment -Imodium as needed. - family also having similar  symptoms now  #2 acute cystitis-urine cultures are pending. Continue meropenem  #3 hypokalemia- improving after replacement. secondary to diarrhea.  One dose today  #4 altered mental status-likely metabolic encephalopathy from infection and also underlying dementia worsening. -normal ammonia level. No focal deficits. Continue to monitor closely Also underlying dementia Low dose risperidone bid today for agitation  #5 depression and anxiety-continue home medications. Patient on Wellbutrin and Lexapro. Also use Ativan as needed  #6 hypertension-blood pressure is normal. Continue to hold Norvasc  #7 diabetes mellitus-hold glipizide as poor oral intake and sugars have been on low normal side.  Continue sliding scale insulin. - also poor oral intake  #8 DVT prophylaxis-on subcutaneous heparin  Physical therapy consulted. Family interested in placement. Social worker aware. Likely assisted living placement   .  All the records are reviewed and case discussed with Care Management/Social Workerr. Management plans discussed with the patient, family and they are in agreement.  CODE STATUS: Full Code  TOTAL TIME TAKING CARE OF THIS PATIENT: 37 minutes.   POSSIBLE D/C IN 2 DAYS, DEPENDING ON CLINICAL CONDITION.   Gladstone Lighter M.D on 02/26/2016 at 11:57 AM  Between 7am to 6pm - Pager - [phone]  After 6pm go to [email or web address] - password [facility]  Office  [phone]  CC: Primary care physician; Wilhemena Durie, MD

## 2016-02-26 NOTE — Progress Notes (Signed)
Assisted pt with meal- having visual hallucinations of people in room and talking to them/ "see the roses" at this time; "need to get up and take the children to school".  Pt drank her tea, 1/2 milk, 3 bites mashed potatoes, 1 bite meat, 1 bite carrot, 5 bites of pudding. Needs much encouragement to eat.  Reoriented/ reassured. Reeducating pt not to try to get OOB; "call, don't fall."

## 2016-02-26 NOTE — Progress Notes (Signed)
Contact enteric precautions continued. Pt more confused, irritable needing much redirection, support, reorientation; music added. Pulls 02 off with frequent reapplications; pulled telemetry off with MD order to discontinued. FSBS's lower normal with decreased appetite today- needs much encouragement to drink po's; refuses foods with any texture/substance. Son in and states pt takes ativan at home usually about twice a day and especially at bedtime- ativan given with slight decrease in agitation/picking at clothes, etc.  Pt agreed to labs. No diarrhea to this point today. Contiues to void incontinent.

## 2016-02-26 NOTE — Progress Notes (Signed)
Legrand Como, son, reports pt took off her wedding band and laid it in the bed. He has it and he is going to take if home - ring is band type with white and yellow.

## 2016-02-27 LAB — BASIC METABOLIC PANEL
ANION GAP: 9 (ref 5–15)
BUN: 14 mg/dL (ref 6–20)
CALCIUM: 9 mg/dL (ref 8.9–10.3)
CO2: 25 mmol/L (ref 22–32)
Chloride: 105 mmol/L (ref 101–111)
Creatinine, Ser: 0.96 mg/dL (ref 0.44–1.00)
GFR, EST NON AFRICAN AMERICAN: 53 mL/min — AB (ref >60)
GLUCOSE: 82 mg/dL (ref 65–99)
POTASSIUM: 4.1 mmol/L (ref 3.5–5.1)
Sodium: 139 mmol/L (ref 135–145)

## 2016-02-27 LAB — GLUCOSE, CAPILLARY
GLUCOSE-CAPILLARY: 72 mg/dL (ref 65–99)
GLUCOSE-CAPILLARY: 81 mg/dL (ref 65–99)
Glucose-Capillary: 83 mg/dL (ref 65–99)
Glucose-Capillary: 75 mg/dL (ref 65–99)

## 2016-02-27 MED ORDER — OLANZAPINE 10 MG PO TABS
5.0000 mg | ORAL_TABLET | Freq: Every day | ORAL | Status: DC
  Administered 2016-02-27 – 2016-02-29 (×3): 5 mg via ORAL
  Filled 2016-02-27 (×3): qty 1

## 2016-02-27 MED ORDER — HALOPERIDOL LACTATE 5 MG/ML IJ SOLN
2.0000 mg | Freq: Four times a day (QID) | INTRAMUSCULAR | Status: DC | PRN
  Administered 2016-02-29: 2 mg via INTRAVENOUS
  Filled 2016-02-27: qty 1

## 2016-02-27 MED ORDER — HALOPERIDOL LACTATE 5 MG/ML IJ SOLN
2.0000 mg | Freq: Once | INTRAMUSCULAR | Status: AC
  Administered 2016-02-27: 14:00:00 2 mg via INTRAVENOUS
  Filled 2016-02-27: qty 1

## 2016-02-27 NOTE — Progress Notes (Signed)
PT is recommending SNF. SNF search started. Blue Medicare SNF authorization was started through [facility].   [employer], LCSW [phone]

## 2016-02-27 NOTE — Evaluation (Signed)
Physical Therapy Evaluation Patient Details Name: Deborah [name] MRN: [id number] DOB: December 25, 1930 Today's Date: 02/27/2016   History of Present Illness  Pt admitted to hospital w/ sepsis due to UTI. Has history of obesity.  Clinical Impression  Pt lethargic throughout evaluation but able to follow basic commands to participate. Pt's strength is grossly 4/5 for UE and Le. Pt able to perfrom supine therex with frequent verbal cues and min guarding. Sensation appears WFLs. She requires max assist x2 and frequent verbal cues to move from supine to EOB. OOB activities were not assessed due to poor sitting balance and bed mobility. Pt was able to sit at EOB with max assist but progressed to min assist as patient became more alert, she demonstrated a posterior lean. Pt able to perform reaching activities in sitting with max assist and progressed to min assist as she corrected her posterior lean. Pt demonstrates deceased functional mobility due to poor overall strength and obesity. She will benefit from skilled PT to improve functional status.      Follow Up Recommendations SNF    Equipment Recommendations  Other (comment) (will need to consult family member on home equipment needs)    Recommendations for Other Services       Precautions / Restrictions Precautions Precautions: Fall Restrictions Weight Bearing Restrictions: No      Mobility  Bed Mobility Overal bed mobility: Needs Assistance Bed Mobility: Supine to Sit     Supine to sit: +2 for physical assistance;Max assist     General bed mobility comments: requires frequent verbal cues to assist with bed mobility, able to initiate motion but requires assistance due to weakness  Transfers                 General transfer comment: Did not attempt  Ambulation/Gait             General Gait Details: Did not attempt  Stairs            Wheelchair Mobility    Modified Rankin (Stroke Patients Only)        Balance Overall balance assessment: Needs assistance;History of Falls Sitting-balance support: Bilateral upper extremity supported;Feet supported Sitting balance-Leahy Scale: Poor Sitting balance - Comments: Pt required max assist to maintain sitting balance but progressed to min assist with reaching exercises  Postural control: Posterior lean     Standing balance comment: Did not attempt                             Pertinent Vitals/Pain Pain Assessment: No/denies pain    Home Living Family/patient expects to be discharged to:: Private residence Living Arrangements: Spouse/significant other                    Prior Function Level of Independence: Needs assistance               Hand Dominance        Extremity/Trunk Assessment   Upper Extremity Assessment Upper Extremity Assessment: Generalized weakness (grossly 4/5)    Lower Extremity Assessment Lower Extremity Assessment: Generalized weakness (Grossly 4-/5)       Communication   Communication: Other (comment) (pt confused )  Cognition Arousal/Alertness: Lethargic Behavior During Therapy: Restless Overall Cognitive Status: No family/caregiver present to determine baseline cognitive functioning                 General Comments: pt disoriented to place and unable to follow complex commands  General Comments      Exercises Other Exercises Other Exercises: Sitting reaching exercises, 1x10 to improve dynamic sitting balance and posture. Required max assist but progressed to min assist  Other Exercises: supine therex, AROM, 1x10; SLR, hip abd, SAQs, Heel Slides to improve LE strength for functional mobility    Assessment/Plan    PT Assessment Patient needs continued PT services  PT Problem List Decreased strength;Decreased activity tolerance;Decreased balance;Decreased mobility;Obesity;Decreased range of motion;Decreased cognition;Decreased knowledge of use of DME          PT  Treatment Interventions DME instruction;Gait training;Functional mobility training;Balance training;Therapeutic exercise;Therapeutic activities;Patient/family education;Stair training    PT Goals (Current goals can be found in the Care Plan section)  Acute Rehab PT Goals Patient Stated Goal: Unable to assess due to lethargy  PT Goal Formulation: Patient unable to participate in goal setting    Frequency Min 2X/week   Barriers to discharge Inaccessible home environment;Decreased caregiver support      Co-evaluation               End of Session   Activity Tolerance: Patient limited by lethargy Patient left: in bed;with call bell/phone within reach;with bed alarm set           Time: 0935-1004 PT Time Calculation (min) (ACUTE ONLY): 29 min   Charges:         PT G Codes:        Maddox Bratcher 02/27/2016, 11:00 AM

## 2016-02-27 NOTE — NC FL2 (Signed)
[place] LEVEL OF CARE SCREENING TOOL     IDENTIFICATION  Patient Name: Deborah [name] Birthdate: 05/27/1930 Sex: female Admission Date (Current Location): 02/24/2016  [place] and [place] Number:  Engineering geologist and Address:  [facility], [address], [place], [place] [address]      Provider Number: [id number]  Attending Physician Name and Address:  Gladstone Lighter, MD  Relative Name and Phone Number:       Current Level of Care: Hospital Recommended Level of Care: SNF  Prior Approval Number:    Date Approved/Denied:   PASRR Number:   [id number]  Discharge Plan: SNF     Current Diagnoses: Patient Active Problem List   Diagnosis Date Noted  . Sepsis ([place]) 02/24/2016  . UTI (urinary tract infection) 02/24/2016  . Memory loss 07/18/2015  . Depression 01/12/2015  . Shoulder pain 12/17/2014  . Pain in shoulder 08/05/2014  . Gonalgia 07/21/2014  . History of knee surgery 07/21/2014  . Allergic rhinitis 07/16/2014  . Anxiety 07/16/2014  . Benign hypertension 07/16/2014  . Alteration in bowel elimination: incontinence 07/16/2014  . Chronic kidney disease (CKD), stage III (moderate) 07/16/2014  . Colitis 07/16/2014  . CN (constipation) 07/16/2014  . D (diarrhea) 07/16/2014  . Contracture of palmar fascia (Dupuytren's) 07/16/2014  . Hypercholesteremia 07/16/2014  . Hypoxia 07/16/2014  . Absence of bladder continence 07/16/2014  . Focal lymphocytic colitis 07/16/2014  . Extreme obesity ([place]) 07/16/2014  . Arthritis, degenerative 07/16/2014  . Disorder of peripheral nervous system ([place]) 07/16/2014  . Neuralgia neuritis, sciatic nerve 07/16/2014  . Hyperparathyroidism, secondary ([place]) 07/16/2014  . Apnea, sleep 07/16/2014  . Subclinical hypothyroidism 07/16/2014  . Controlled type 2 diabetes mellitus ([place]) 07/16/2014  . 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 Urinary incontinence without sensory awareness 09/03/2012  .  Palpitations 12/28/2010  . SOB (shortness of breath) 12/28/2010  . Overweight 08/02/2009    Orientation RESPIRATION BLADDER Height & Weight     Self  Normal Continent (Uses adult diapers PRN) Weight: (!) 330 lb 12.8 oz (150 kg) Height:  5' 6"  (167.6 cm)  BEHAVIORAL SYMPTOMS/MOOD NEUROLOGICAL BOWEL NUTRITION STATUS  Other (Comment) (Significant fall risk)   Incontinent, Continent (Uses adult diapers PRN)  DYS 2  AMBULATORY STATUS COMMUNICATION OF NEEDS Skin   Extensive Assist Verbally Normal                       Personal Care Assistance Level of Assistance  Bathing, Dressing Bathing Assistance: Maximum assistance   Dressing Assistance: Maximum assistance     Functional Limitations Info             SPECIAL CARE FACTORS FREQUENCY   PT and OT     4-5 times per week                 Contractures Contractures Info: Present    Additional Factors Info  Allergies   Allergies Info: Ace Inhibitors, Ciprofloxacin Hcl, Quinolones    Isolation for norovirus.          Current Medications (02/27/2016):  This is the current hospital active medication list Current Facility-Administered Medications  Medication Dose Route Frequency Provider Last Rate Last Dose  . acetaminophen (TYLENOL) tablet 650 mg  650 mg Oral Q6H PRN Lance Coon, MD       Or  . acetaminophen (TYLENOL) suppository 650 mg  650 mg Rectal Q6H PRN Lance Coon, MD      . budesonide (ENTOCORT  EC) 24 hr capsule 6 mg  6 mg Oral Daily Lance Coon, MD   6 mg at 02/27/16 1026  . buPROPion [facility] SR) 12 hr tablet 150 mg  150 mg Oral BID Lance Coon, MD   150 mg at 02/27/16 1026  . escitalopram (LEXAPRO) tablet 20 mg  20 mg Oral Daily Lance Coon, MD   20 mg at 02/27/16 1026  . feeding supplement (ENSURE ENLIVE) (ENSURE ENLIVE) liquid 237 mL  237 mL Oral BID BM Gladstone Lighter, MD   237 mL at 02/27/16 1314  . haloperidol lactate (HALDOL) injection 2 mg  2 mg Intravenous Q6H PRN Gladstone Lighter, MD       . heparin injection 5,000 Units  5,000 Units Subcutaneous Q8H Lance Coon, MD   5,000 Units at 02/27/16 1321  . imipramine (TOFRANIL) tablet 25 mg  25 mg Oral QHS Lance Coon, MD   25 mg at 02/26/16 2201  . insulin aspart (novoLOG) injection 0-5 Units  0-5 Units Subcutaneous QHS Lance Coon, MD      . insulin aspart (novoLOG) injection 0-9 Units  0-9 Units Subcutaneous TID WC Lance Coon, MD      . loperamide (IMODIUM) capsule 2 mg  2 mg Oral Q6H PRN Gladstone Lighter, MD   2 mg at 02/26/16 2202  . MEDLINE mouth rinse  15 mL Mouth Rinse BID Gladstone Lighter, MD   15 mL at 02/27/16 0930  . meropenem (MERREM) IVPB SOLR 1 g  1 g Intravenous Q8H Lance Coon, MD   1 g at 02/27/16 1714  . montelukast (SINGULAIR) tablet 10 mg  10 mg Oral Daily Lance Coon, MD   10 mg at 02/27/16 1026  . OLANZapine (ZYPREXA) tablet 5 mg  5 mg Oral QHS Gladstone Lighter, MD      . ondansetron [facility]) tablet 4 mg  4 mg Oral Q6H PRN Lance Coon, MD       Or  . ondansetron [facility] injection 4 mg  4 mg Intravenous Q6H PRN Lance Coon, MD      . saccharomyces boulardii [facility]) capsule 250 mg  250 mg Oral BID Lance Coon, MD   250 mg at 02/27/16 1026  . simvastatin (ZOCOR) tablet 20 mg  20 mg Oral QHS Lance Coon, MD   20 mg at 02/26/16 2202  . sodium chloride flush (NS) 0.9 % injection 3 mL  3 mL Intravenous Q12H Lance Coon, MD   3 mL at 02/27/16 0930     Discharge Medications: Please see discharge summary for a list of discharge medications.  Relevant Imaging Results:  Relevant Lab Results:   Additional Information SS# [id number]  Norabelle Kondo, Veronia Beets, [place]

## 2016-02-27 NOTE — Progress Notes (Signed)
[facility]  Patient Status: Active  [facility] is providing the following services: SN/PT  If patient discharges after hours, please call [phone].   Deborah [name] 02/27/2016, 4:43 PM

## 2016-02-27 NOTE — Progress Notes (Signed)
[facility] [place] NAME: Deborah [name]    MR#:  [id number]  DATE OF BIRTH:  13-Jan-1931  SUBJECTIVE:  CHIEF COMPLAINT:   Chief Complaint  Patient presents with  . Nausea  . Emesis  . Diarrhea   -Alert but very confused and delirious. Hallucinating. -Unable to get out of bed with physical therapy.  REVIEW OF SYSTEMS:  Review of Systems  Unable to perform ROS: Dementia    DRUG ALLERGIES:   Allergies  Allergen Reactions  . Ace Inhibitors Cough  . Ciprofloxacin Hcl Other (See Comments)    Reaction: unknown  . Quinolones Other (See Comments)    Reaction: Tendon rupture    VITALS:  Blood pressure (!) 138/109, pulse 76, temperature 97.3 F (36.3 C), temperature source Axillary, resp. rate 16, height 5' 6"  (1.676 m), weight (!) 150 kg (330 lb 12.8 oz), SpO2 97 %.  PHYSICAL EXAMINATION:  Physical Exam   GENERAL:  83 y.o.-year-old obese patient lying in the bed with no acute distress.  EYES: Pupils equal, round, reactive to light and accommodation. No scleral icterus. Extraocular muscles intact.  HEENT: Head atraumatic, normocephalic. Oropharynx and nasopharynx clear.  NECK:  Supple, no jugular venous distention. No thyroid enlargement, no tenderness.  LUNGS: Normal breath sounds bilaterally, no wheezing, rales,rhonchi or crepitation. No use of accessory muscles of respiration.  CARDIOVASCULAR: S1, S2 normal. No murmurs, rubs, or gallops.  ABDOMEN: Soft, obese abdomen, nontender, nondistended. Bowel sounds present. No organomegaly or mass.  EXTREMITIES: No pedal edema, cyanosis, or clubbing.  NEUROLOGIC: No new focal neurological deficits, follows commands PSYCHIATRIC: The patient is alert and oriented to self. Very confused and agitated SKIN: No obvious rash, lesion, or ulcer.    LABORATORY PANEL:   CBC  Recent Labs Lab 02/25/16 0735  WBC 4.0  HGB 12.2  HCT 36.9  PLT 152    ------------------------------------------------------------------------------------------------------------------  Chemistries   Recent Labs Lab 02/24/16 1808 02/25/16 0735  02/27/16 0550  NA 139 140  < > 139  K 4.0 2.5*  < > 4.1  CL 102 104  < > 105  CO2 26 28  < > 25  GLUCOSE 166* 86  < > 82  BUN 15 18  < > 14  CREATININE 1.21* 1.26*  < > 0.96  CALCIUM 8.8* 8.2*  < > 9.0  MG  --  1.7  --   --   AST 23  --   --   --   ALT 13*  --   --   --   ALKPHOS 63  --   --   --   BILITOT 1.5*  --   --   --   < > = values in this interval not displayed. ------------------------------------------------------------------------------------------------------------------  Cardiac Enzymes  Recent Labs Lab 02/24/16 1808  TROPONINI <0.03   ------------------------------------------------------------------------------------------------------------------  RADIOLOGY:  No results found.  EKG:   Orders placed or performed during the hospital encounter of 02/24/16  . EKG 12-Lead  . EKG 12-Lead    ASSESSMENT AND PLAN:   83 year old female with past medical history significant for dementia, anxiety and depression, hypertension, diabetes, CK D, neuropathy presents from home secondary to nausea vomiting and diarrhea and noted to have multiple falls.  #1 Acute viral gastroenteritis- Still nauseous, vomiting is resolved. Improving. -C. difficile is negative. GI panel with no norovirus. Continue symptomatic treatment -Imodium as needed. - family also having similar symptoms now  #2 acute cystitis-urine cultures are growing  Escherichia coli.. Continue meropenem  #3 hypokalemia- improving after replacement. secondary to diarrhea.   #4 metabolic encephalopathy And acute delirium-from infection and also underlying dementia worsening. -normal ammonia level. No focal deficits. Not responded to risperidone. Added Zyprexa for tonight Also underlying dementia When necessary Haldol.  #5  depression and anxiety-continue home medications. Patient on Wellbutrin and Lexapro.   #6 hypertension-blood pressure is normal. Continue to hold Norvasc  #7 diabetes mellitus-hold glipizide as poor oral intake and sugars have been on low normal side.  Continue sliding scale insulin. - also poor oral intake  #8 DVT prophylaxis-on subcutaneous heparin  Physical therapy consulted. Family interested in placement. Social worker aware.  .  All the records are reviewed and case discussed with Care Management/Social Workerr. Management plans discussed with the patient, family and they are in agreement.  CODE STATUS: Full Code  TOTAL TIME TAKING CARE OF THIS PATIENT: 37 minutes.   POSSIBLE D/C IN 1-2 DAYS, DEPENDING ON CLINICAL CONDITION.   Gladstone Lighter M.D on 02/27/2016 at 4:44 PM  Between 7am to 6pm - Pager - [phone]  After 6pm go to [email or web address] - password [email or web address]  Office  [phone]  CC: Primary care physician; Wilhemena Durie, MD

## 2016-02-27 NOTE — Care Management (Signed)
Admitted to [facility] with the diagnosis of sepsis. Lives with husband, Herbie Baltimore, [phone]). Sees Dr. Rosanna Randy as primary care physician. Home Health per [facility] for skilled nursing facility. Frequent falls in the home. Bedside Commode in the home.  Physical therapy evaluation completed. Recommending skilled nursing facility.  Shelbie Ammons RN MSN CCM Care Management

## 2016-02-27 NOTE — Care Management Important Message (Signed)
Important Message  Patient Details  Name: Deborah [name] MRN: [id number] Date of Birth: Sep 15, 1930   Medicare Important Message Given:  Yes    Shelbie Ammons, RN 02/27/2016, 11:18 AM

## 2016-02-27 NOTE — Telephone Encounter (Signed)
Please review-aa 

## 2016-02-28 DIAGNOSIS — N390 Urinary tract infection, site not specified: Secondary | ICD-10-CM

## 2016-02-28 DIAGNOSIS — G3183 Dementia with Lewy bodies: Secondary | ICD-10-CM

## 2016-02-28 DIAGNOSIS — Z515 Encounter for palliative care: Secondary | ICD-10-CM

## 2016-02-28 DIAGNOSIS — Z7189 Other specified counseling: Secondary | ICD-10-CM

## 2016-02-28 DIAGNOSIS — Z66 Do not resuscitate: Secondary | ICD-10-CM

## 2016-02-28 DIAGNOSIS — F0280 Dementia in other diseases classified elsewhere without behavioral disturbance: Secondary | ICD-10-CM

## 2016-02-28 LAB — GLUCOSE, CAPILLARY
GLUCOSE-CAPILLARY: 62 mg/dL — AB (ref 65–99)
GLUCOSE-CAPILLARY: 91 mg/dL (ref 65–99)
GLUCOSE-CAPILLARY: 89 mg/dL (ref 65–99)
GLUCOSE-CAPILLARY: 113 mg/dL — AB (ref 65–99)
Glucose-Capillary: 61 mg/dL — ABNORMAL LOW (ref 65–99)
Glucose-Capillary: 69 mg/dL (ref 65–99)
Glucose-Capillary: 78 mg/dL (ref 65–99)

## 2016-02-28 MED ORDER — DEXTROSE 50 % IV SOLN
INTRAVENOUS | Status: AC
  Filled 2016-02-28: qty 50

## 2016-02-28 MED ORDER — DEXTROSE 5 % IV SOLN
1.0000 g | INTRAVENOUS | Status: DC
  Administered 2016-02-28: 1 g via INTRAVENOUS
  Filled 2016-02-28 (×2): qty 10

## 2016-02-28 MED ORDER — DEXTROSE 50 % IV SOLN
25.0000 mL | Freq: Once | INTRAVENOUS | Status: AC
  Administered 2016-02-28: 10:00:00 25 mL via INTRAVENOUS

## 2016-02-28 NOTE — Progress Notes (Signed)
[facility] at [place] NAME: Deborah [name]    MR#:  [id number]  DATE OF BIRTH:  08/22/1930  SUBJECTIVE:  CHIEF COMPLAINT:   Chief Complaint  Patient presents with  . Nausea  . Emesis  . Diarrhea   -Alert but very confused and delirious. Hallucinating. -husband at bedside, extremely poor po intake, low sugars - family worried about Bipap  REVIEW OF SYSTEMS:  Review of Systems  Unable to perform ROS: Dementia    DRUG ALLERGIES:   Allergies  Allergen Reactions  . Ace Inhibitors Cough  . Ciprofloxacin Hcl Other (See Comments)    Reaction: unknown  . Quinolones Other (See Comments)    Reaction: Tendon rupture    VITALS:  Blood pressure (!) 155/68, pulse 85, temperature 98.3 F (36.8 C), resp. rate 20, height 5' 6"  (1.676 m), weight (!) 150 kg (330 lb 12.8 oz), SpO2 95 %.  PHYSICAL EXAMINATION:  Physical Exam   GENERAL:  83 y.o.-year-old obese patient lying in the bed with no acute distress.  EYES: Pupils equal, round, reactive to light and accommodation. No scleral icterus. Extraocular muscles intact.  HEENT: Head atraumatic, normocephalic. Oropharynx and nasopharynx clear.  NECK:  Supple, no jugular venous distention. No thyroid enlargement, no tenderness.  LUNGS: Normal breath sounds bilaterally, no wheezing, rales,rhonchi or crepitation. No use of accessory muscles of respiration.  CARDIOVASCULAR: S1, S2 normal. No murmurs, rubs, or gallops.  ABDOMEN: Soft, obese abdomen, nontender, nondistended. Bowel sounds present. No organomegaly or mass.  EXTREMITIES: No pedal edema, cyanosis, or clubbing.  NEUROLOGIC: No new focal neurological deficits, follows commands PSYCHIATRIC: The patient is alert and oriented to self. Very confused and agitated SKIN: No obvious rash, lesion, or ulcer.    LABORATORY PANEL:   CBC  Recent Labs Lab 02/25/16 0735  WBC 4.0  HGB 12.2  HCT 36.9  PLT 152    ------------------------------------------------------------------------------------------------------------------  Chemistries   Recent Labs Lab 02/24/16 1808 02/25/16 0735  02/27/16 0550  NA 139 140  < > 139  K 4.0 2.5*  < > 4.1  CL 102 104  < > 105  CO2 26 28  < > 25  GLUCOSE 166* 86  < > 82  BUN 15 18  < > 14  CREATININE 1.21* 1.26*  < > 0.96  CALCIUM 8.8* 8.2*  < > 9.0  MG  --  1.7  --   --   AST 23  --   --   --   ALT 13*  --   --   --   ALKPHOS 63  --   --   --   BILITOT 1.5*  --   --   --   < > = values in this interval not displayed. ------------------------------------------------------------------------------------------------------------------  Cardiac Enzymes  Recent Labs Lab 02/24/16 1808  TROPONINI <0.03   ------------------------------------------------------------------------------------------------------------------  RADIOLOGY:  No results found.  EKG:   Orders placed or performed during the hospital encounter of 02/24/16  . EKG 12-Lead  . EKG 12-Lead    ASSESSMENT AND PLAN:   83 year old female with past medical history significant for dementia, anxiety and depression, hypertension, diabetes, CK D, neuropathy presents from home secondary to nausea vomiting and diarrhea and noted to have multiple falls.  #1 Acute viral gastroenteritis- Still nauseous, vomiting is resolved. Improving. -C. difficile is negative. GI panel with no norovirus. Continue symptomatic treatment -Imodium as needed.  #2 acute cystitis-urine cultures are growing Escherichia coli.. Change meropenem to  rocephin  #3  Poor appetite- failure to thrive- palliative care consult If not improving, consider hospice  #4 metabolic encephalopathy And acute delirium-from infection and also underlying dementia worsening. -normal ammonia level. No focal deficits. Not responded to risperidone. Added Zyprexa Also underlying dementia When necessary Haldol. CPAP at bedtime  #5  depression and anxiety-continue home medications. Patient on Wellbutrin and Lexapro.   #6 hypertension-blood pressure is normal. Continue to hold Norvasc  #7 diabetes mellitus-hold glipizide as poor oral intake and sugars have been on low normal side.  - also poor oral intake  #8 DVT prophylaxis-on subcutaneous heparin  Physical therapy consulted. Family interested in placement. Social worker aware. Needs long term care- but if poor oral intake- consider hospice .  All the records are reviewed and case discussed with Care Management/Social Workerr. Management plans discussed with the patient, family and they are in agreement.  CODE STATUS: DNR  TOTAL TIME TAKING CARE OF THIS PATIENT: 37 minutes.   POSSIBLE D/C IN 1-2 DAYS, DEPENDING ON CLINICAL CONDITION.   Gladstone Lighter M.D on 02/28/2016 at 4:15 PM  Between 7am to 6pm - Pager - [phone]  After 6pm go to [email or web address] - password [email or web address]  Office  [phone]  CC: Primary care physician; Wilhemena Durie, MD

## 2016-02-28 NOTE — Progress Notes (Signed)
Inpatient Diabetes Program Recommendations  AACE/ADA: New Consensus Statement on Inpatient Glycemic Control (2015)  Target Ranges:  Prepandial:   less than 140 mg/dL      Peak postprandial:   less than 180 mg/dL (1-2 hours)      Critically ill patients:  140 - 180 mg/dL   Results for Deborah [name], Deborah [name] (MRN [id number]) as of 02/28/2016 12:27  Ref. Range 02/27/2016 07:42 02/27/2016 11:54 02/27/2016 16:44 02/27/2016 21:00 02/28/2016 07:51 02/28/2016 08:37 02/28/2016 09:19 02/28/2016 10:09 02/28/2016 11:43  Glucose-Capillary Latest Ref Range: 65 - 99 mg/dL 83 72 81 75 61 (L) 69 62 (L) 91 89   Review of Glycemic Control  Diabetes history: DM2 Outpatient Diabetes medications: Glipizide 5 mg daily Current orders for Inpatient glycemic control: Novolog 0-9 units TID with meals, Novolog 0-5 units QHS  Inpatient Diabetes Program Recommendations:  Insulin-Correction: Patient has not received any insulin or hypoglycemic medications since being admitted but experiencing recurrent hypoglycemia. Please consider discontinuing Novolog correction and continue CBG monitoring.  Thanks, Barnie Alderman, RN, MSN, CDE Diabetes Coordinator Inpatient Diabetes Program [phone] (Team Pager from 8am to 5pm)

## 2016-02-28 NOTE — Consult Note (Signed)
Consultation Note Date: 02/28/2016   Patient Name: Deborah [name]  DOB: [birth year]  MRN: [id number]  Age / Sex: 83 y.o., female  PCP: Jerrol Banana., MD Referring Physician: Gladstone Lighter, MD  Reason for Consultation: Establishing goals of care  HPI/Patient Profile: 83 y.o. female with past medical history of dementia, diabetes mellitus, neuropathy, chronic kidney disease stage III, hypertension, hyperlipidemia, anxiety, and depression admitted on 02/24/2016 with nausea, vomiting, and diarrhea. GI/cdiff panel negative-acute viral gastroenteritis. Positive for UTI-receiving meropenem. Patient with metabolic encephalopathy from UTI with underlying dementia. Palliative medicine consultation for goals of care.    Clinical Assessment and Goals of Care: I have reviewed medical records, discussed with Dr. Tressia Miners, and met with husband, Herbie Baltimore in family room to discuss diagnosis, [place], EOL wishes, disposition and options.  I introduced Palliative Medicine as specialized medical care for people living with serious illness. It focuses on providing relief from the symptoms and stress of a serious illness. The goal is to improve quality of life for both the patient and the family.  Discussed a brief life review of the patient. Mr. and Mrs. Casher have been married for 57 years. She worked at [employer] in [employer] for her entire career. Herbie Baltimore speaks of her being very Human resources officer and witty." Before her health decline from dementia, she loved to craft and cook. They have two children. Herbie Baltimore tells me of her decline in the last year. She has been attempting to work with physical therapists and has been to rehab but has basically been bed bound within the last 3-4 months. She has increased confusion and has not fed herself in over two weeks. Herbie Baltimore speaks of his challenges of caring for her 24/7 with worsening dementia  and that "I am 26 and just can't do it anymore." His biggest concern is placing her in a memory care unit or long term care facility.   Discussed the natural trajectory of dementia. Husband understands and "wouldn't be surprised if she was gone in the next four months." He becomes tearful during the conversation.  Herbie Baltimore tells me they do not have POA paperwork but might have a living will. Explained that he automatically becomes her POA as spouse and with no other documented POA in place. I asked Herbie Baltimore if her heart would stop, and she naturally died would he want [place] to resuscitate her or put her on life support? He tells me she would not want this. Educated on this being DNR status and that I agreed with his decision explaining the unlikelihood of her surviving resuscitation or coming off a ventilator. Also that it is not recommended with her age and chronic co-morbidities. He agrees with DNR. Also tells me that he would never want a feeding tube to extend her life.   Palliative Care services outpatient were explained and offered. Herbie Baltimore agrees with palliative to follow on discharge and open to hospice if she should decline and as disease progresses.   Questions and concerns were addressed. Hard Choices copy given.  SUMMARY OF RECOMMENDATIONS    Discussed and educated on code status. Changed to DNR/DNI. Also, husband would NOT want PEG tube.   Husband's main concern is having her placed in a memory care unit or long term care facility. He can no longer care for her at home.   Agreeable with palliative services to follow on discharge. Social work notified.  If patient declines, husband would be open to hospice services at facility.   PMT will continue to shadow chart and follow-up with family if necessary.   Code Status/Advance Care Planning:  DNR   Symptom Management:   Per attending  Palliative Prophylaxis:   Aspiration, Delirium Protocol, Frequent Pain Assessment, Oral Care and  Turn Reposition  Psycho-social/Spiritual:   Desire for further Chaplaincy support:yes  Additional Recommendations: Caregiving  Support/Resources and Education on Hospice  Prognosis:   Unable to determine  Discharge Planning: To Be Determined      Primary Diagnoses: Present on Admission: . Sepsis ([place]) . Anxiety . Chronic kidney disease (CKD), stage III (moderate) . UTI (urinary tract infection) . Benign hypertension   I have reviewed the medical record, interviewed the patient and family, and examined the patient. The following aspects are pertinent.  Past Medical History:  Diagnosis Date  . Anxiety   . Chronic kidney disease, stage III (moderate)   . Contracture of palmar fascia   . Cough   . Diabetes mellitus   . Hyperlipidemia   . Hypertension   . Obesity   . Osteoarthritis   . Other specified acquired hypothyroidism   . Otitis externa   . Palpitations   . Peripheral neuropathy ([place])   . Pure hypercholesterolemia   . Secondary hyperparathyroidism (of renal origin)   . Sleep apnea   . Unspecified adjustment reaction   . Unspecified hereditary and idiopathic peripheral neuropathy   . Unspecified urinary incontinence    Social History   Social History  . Marital status: Married    Spouse name: N/A  . Number of children: 2  . Years of education: N/A   Occupational History  . Retired [employer]    Worked in Proofreader.    Social History Main Topics  . Smoking status: Former Smoker    Packs/day: 10.00    Years: 1.00    Types: Cigarettes    Quit date: 12/27/1997  . Smokeless tobacco: Never Used  . Alcohol use No  . Drug use: No  . Sexual activity: No   Other Topics Concern  . None   Social History Narrative  . None   Family History  Problem Relation Age of Onset  . Heart attack Father   . Hypertension Sister   . Hypertension Sister   . Alzheimer's disease Mother    Scheduled Meds: . budesonide  6 mg Oral Daily  . buPROPion   150 mg Oral BID  . escitalopram  20 mg Oral Daily  . feeding supplement (ENSURE ENLIVE)  237 mL Oral BID BM  . heparin  5,000 Units Subcutaneous Q8H  . imipramine  25 mg Oral QHS  . insulin aspart  0-5 Units Subcutaneous QHS  . insulin aspart  0-9 Units Subcutaneous TID WC  . mouth rinse  15 mL Mouth Rinse BID  . meropenem  1 g Intravenous Q8H  . montelukast  10 mg Oral Daily  . OLANZapine  5 mg Oral QHS  . saccharomyces boulardii  250 mg Oral BID  . simvastatin  20 mg Oral QHS  . sodium  chloride flush  3 mL Intravenous Q12H   Continuous Infusions: PRN Meds:.acetaminophen **OR** acetaminophen, haloperidol lactate, loperamide, ondansetron **OR** ondansetron (ZOFRAN) IV Medications Prior to Admission:  Prior to Admission medications   Medication Sig Start Date End Date Taking? Authorizing Provider  acetaminophen (TYLENOL) 500 MG tablet Take 500 mg by mouth every 6 (six) hours as needed for mild pain.    Yes Historical Provider, MD  amLODipine (NORVASC) 5 MG tablet Take 5 mg by mouth 2 (two) times daily.    Yes Historical Provider, MD  aspirin EC 81 MG tablet Take 81 mg by mouth at bedtime.   Yes Historical Provider, MD  budesonide (ENTOCORT EC) 3 MG 24 hr capsule Take 2 capsules (6 mg total) by mouth daily. Patient taking differently: Take 3 mg by mouth 2 (two) times daily.  07/05/15  Yes Margarita Rana, MD  buPROPion [facility] SR) 150 MG 12 hr tablet TAKE 1 TABLET BY MOUTH 2 TIMES DAILY 07/25/15  Yes Margarita Rana, MD  cetirizine (ZYRTEC) 10 MG tablet Take 10 mg by mouth at bedtime. Reported on 08/17/2015 06/15/13  Yes Historical Provider, MD  escitalopram (LEXAPRO) 20 MG tablet Take 1 tablet (20 mg total) by mouth daily. 12/17/14  Yes Margarita Rana, MD  glipiZIDE (GLUCOTROL) 5 MG tablet TAKE 1 TABLET BY MOUTH ONCE A DAY 07/25/15  Yes Margarita Rana, MD  imipramine (TOFRANIL) 25 MG tablet Take 1 tablet (25 mg total) by mouth 2 (two) times daily. Patient taking differently: Take 25 mg by mouth  at bedtime.  10/07/15  Yes Richard Maceo Pro., MD  LORazepam (ATIVAN) 0.5 MG tablet Take 0.5 mg by mouth 2 (two) times daily as needed for anxiety.   Yes Historical Provider, MD  nitrofurantoin, macrocrystal-monohydrate, (MACROBID) 100 MG capsule Take 1 capsule (100 mg total) by mouth 2 (two) times daily. 02/23/16  Yes Richard Maceo Pro., MD  simvastatin (ZOCOR) 20 MG tablet Take 20 mg by mouth at bedtime. Reported on 08/17/2015 07/18/15  Yes Margarita Rana, MD   Allergies  Allergen Reactions  . Ace Inhibitors Cough  . Ciprofloxacin Hcl Other (See Comments)    Reaction: unknown  . Quinolones Other (See Comments)    Reaction: Tendon rupture   Review of Systems  Unable to perform ROS: Mental status change   Physical Exam  Constitutional: She is easily aroused. She appears ill.  HENT:  Head: Normocephalic and atraumatic.  Cardiovascular: Regular rhythm and normal heart sounds.   Pulmonary/Chest: Effort normal. She has decreased breath sounds.  Abdominal: Normal appearance. There is no tenderness.  Neurological: She is easily aroused. She is disoriented.  Skin: Skin is warm and dry. There is pallor.  Psychiatric: Her speech is delayed. Cognition and memory are impaired. She is inattentive.  Nursing note and vitals reviewed.  Vital Signs: BP (!) 155/68 (BP Location: Right Arm)   Pulse 85   Temp 98.3 F (36.8 C)   Resp 20   Ht _0  (1.676 m)   Wt (!) 150 kg (330 lb 12.8 oz)   SpO2 95%   BMI 53.39 kg/m  Pain Assessment: No/denies pain   Pain Score: 0-No pain   SpO2: SpO2: 95 % O2 Device:SpO2: 95 % O2 Flow Rate: .O2 Flow Rate (L/min): 1.5 L/min  IO: Intake/output summary:   Intake/Output Summary (Last 24 hours) at 02/28/16 1252 Last data filed at 02/28/16 0300  Gross per 24 hour  Intake  150 ml  Output                0 ml  Net              150 ml    LBM: Last BM Date: 02/28/16 Baseline Weight: Weight: (!) 153.8 kg (339 lb) Most recent weight: Weight: (!)  150 kg (330 lb 12.8 oz)     Palliative Assessment/Data: PPS 30%   Flowsheet Rows   Flowsheet Row Most Recent Value  Intake Tab  Referral Department  Hospitalist  Unit at Time of Referral  Oncology Unit  Palliative Care Primary Diagnosis  Neurology  Date Notified  02/27/16  Palliative Care Type  New Palliative care  Reason for referral  Clarify Goals of Care  Date of Admission  02/24/16  Date first seen by Palliative Care  02/28/16  # of days Palliative referral response time  1 Day(s)  # of days IP prior to Palliative referral  3  Clinical Assessment  Palliative Performance Scale Score  30%  Psychosocial & Spiritual Assessment  Palliative Care Outcomes  Patient/Family meeting held?  Yes  Who was at the meeting?  husband  Palliative Care Outcomes  Clarified goals of care, Provided psychosocial or spiritual support, Changed CPR status, Linked to palliative care logitudinal support, Counseled regarding hospice, ACP counseling assistance      Time In: 1100 Time Out: 1215 Time Total: 34mn Greater than 50%  of this time was spent counseling and coordinating care related to the above assessment and plan.  Signed by:  MIhor Dow FNP-C Palliative Medicine Team  Phone: [phone]: [phone]  Please contact Palliative Medicine Team phone at [phone] questions and concerns.  For individual provider: See AShea Evans

## 2016-02-28 NOTE — Progress Notes (Signed)
Hypoglycemic Event  CBG: 61 Treatment: 2 Orange juice CBG up to 69, so gave another orange juice CBG back down to 62 so gave 56m D50 IV  Symptoms: pt was asymptomatic Follow-up CBG: Time:0837, 0919, 1009 CBG Result:69, 62, 91  Possible Reasons for Event: pt not eating well  Comments/MD notified:Dr. KTressia Minerspaged to make aware, still awaiting call back    MPrince Solian RN

## 2016-02-28 NOTE — Clinical Social Work Note (Signed)
CSW received phone call from [facility] from [place] ext [phone] with insurance authorization number.  Insurance authorization number is [id number], CSW updated [facility] which is the facility that patient's family chose.    CSW spoke to patient's husband who said he would like to have patient go to SNF and then transition to long term care.  Patient's husband was explained how to apply for Medicaid and also how to transition to long term care from short term rehab.  Patient's husband asked to have CSW speak to his son Legrand Como [phone] to explain the process for applying for long term care Medicaid and how rehab will pay for stay at SNF.  CSW spoke to patient's son who said he has already set up an appointment to speak with an Elder Care lawyer to help with the process of applying for Medicaid and setting up POA per patient's husband's request.  CSW to continue to follow patient's progress throughout discharge planning.  Jones Broom. Norval Morton, MSW, [place]  02/28/2016 5:41 PM

## 2016-02-28 NOTE — Progress Notes (Signed)
ANTIBIOTIC CONSULT NOTE - INITIAL  Pharmacy Consult for Rocephin Indication: UTI   Allergies  Allergen Reactions  . Ace Inhibitors Cough  . Ciprofloxacin Hcl Other (See Comments)    Reaction: unknown  . Quinolones Other (See Comments)    Reaction: Tendon rupture    Patient Measurements: Height: 5' 6"  (167.6 cm) Weight: (!) 330 lb 12.8 oz (150 kg) IBW/kg (Calculated) : 59.3 Adjusted Body Weight:   Vital Signs: Temp: 98.3 F (36.8 C) (01/16 0510) BP: 155/68 (01/16 0510) Pulse Rate: 85 (01/16 0510) Intake/Output from previous day: 01/15 0701 - 01/16 0700 In: 150 [IV Piggyback:150] Out: -  Intake/Output from this shift: No intake/output data recorded.  Labs:  Recent Labs  02/26/16 0946 02/27/16 0550  CREATININE 1.14* 0.96   Estimated Creatinine Clearance: 67 mL/min (by C-G formula based on SCr of 0.96 mg/dL). No results for input(s): VANCOTROUGH, VANCOPEAK, VANCORANDOM, GENTTROUGH, GENTPEAK, GENTRANDOM, TOBRATROUGH, TOBRAPEAK, TOBRARND, AMIKACINPEAK, AMIKACINTROU, AMIKACIN in the last 72 hours.   Microbiology: Recent Results (from the past 720 hour(s))  Urine culture     Status: Abnormal (Preliminary result)   Collection Time: 02/24/16  7:15 PM  Result Value Ref Range Status   Specimen Description URINE, RANDOM  Final   Special Requests NONE  Final   Culture (A)  Final    >=100,000 COLONIES/mL ESCHERICHIA COLI >=100,000 COLONIES/mL GRAM POSITIVE COCCI IDENTIFICATION TO FOLLOW Performed at [facility]    Report Status PENDING  Incomplete  C difficile quick scan w PCR reflex     Status: None   Collection Time: 02/24/16 10:59 PM  Result Value Ref Range Status   C Diff antigen NEGATIVE NEGATIVE Final   C Diff toxin NEGATIVE NEGATIVE Final   C Diff interpretation No C. difficile detected.  Final  Gastrointestinal Panel by PCR , Stool     Status: Abnormal   Collection Time: 02/25/16  2:55 PM  Result Value Ref Range Status   Campylobacter species NOT  DETECTED NOT DETECTED Final   Plesimonas shigelloides NOT DETECTED NOT DETECTED Final   Salmonella species NOT DETECTED NOT DETECTED Final   Yersinia enterocolitica NOT DETECTED NOT DETECTED Final   Vibrio species NOT DETECTED NOT DETECTED Final   Vibrio cholerae NOT DETECTED NOT DETECTED Final   Enteroaggregative E coli (EAEC) NOT DETECTED NOT DETECTED Final   Enteropathogenic E coli (EPEC) NOT DETECTED NOT DETECTED Final   Enterotoxigenic E coli (ETEC) NOT DETECTED NOT DETECTED Final   Shiga like toxin producing E coli (STEC) NOT DETECTED NOT DETECTED Final   Shigella/Enteroinvasive E coli (EIEC) NOT DETECTED NOT DETECTED Final   Cryptosporidium NOT DETECTED NOT DETECTED Final   Cyclospora cayetanensis NOT DETECTED NOT DETECTED Final   Entamoeba histolytica NOT DETECTED NOT DETECTED Final   Giardia lamblia NOT DETECTED NOT DETECTED Final   Adenovirus F40/41 NOT DETECTED NOT DETECTED Final   Astrovirus NOT DETECTED NOT DETECTED Final   Norovirus GI/GII DETECTED (A) NOT DETECTED Final    Comment: CRITICAL RESULT CALLED TO, READ BACK BY AND VERIFIED WITH: JANE HODGE 02/25/16 1648 SDR    Rotavirus A NOT DETECTED NOT DETECTED Final   Sapovirus (I, II, IV, and V) NOT DETECTED NOT DETECTED Final    Medical History: Past Medical History:  Diagnosis Date  . Anxiety   . Chronic kidney disease, stage III (moderate)   . Contracture of palmar fascia   . Cough   . Diabetes mellitus   . Hyperlipidemia   . Hypertension   . Obesity   .  Osteoarthritis   . Other specified acquired hypothyroidism   . Otitis externa   . Palpitations   . Peripheral neuropathy ([place])   . Pure hypercholesterolemia   . Secondary hyperparathyroidism (of renal origin)   . Sleep apnea   . Unspecified adjustment reaction   . Unspecified hereditary and idiopathic peripheral neuropathy   . Unspecified urinary incontinence     Medications:  Prescriptions Prior to Admission  Medication Sig Dispense Refill Last Dose   . acetaminophen (TYLENOL) 500 MG tablet Take 500 mg by mouth every 6 (six) hours as needed for mild pain.    prn at prn  . amLODipine (NORVASC) 5 MG tablet Take 5 mg by mouth 2 (two) times daily.    02/24/2016 at Unknown time  . aspirin EC 81 MG tablet Take 81 mg by mouth at bedtime.   02/23/2016 at Unknown time  . budesonide (ENTOCORT EC) 3 MG 24 hr capsule Take 2 capsules (6 mg total) by mouth daily. (Patient taking differently: Take 3 mg by mouth 2 (two) times daily. ) 60 capsule 5 02/24/2016 at Unknown time  . buPROPion (WELLBUTRIN SR) 150 MG 12 hr tablet TAKE 1 TABLET BY MOUTH 2 TIMES DAILY 60 tablet 5 02/24/2016 at Unknown time  . cetirizine (ZYRTEC) 10 MG tablet Take 10 mg by mouth at bedtime. Reported on 08/17/2015   02/23/2016 at Unknown time  . escitalopram (LEXAPRO) 20 MG tablet Take 1 tablet (20 mg total) by mouth daily. 30 tablet 5 02/24/2016 at Unknown time  . glipiZIDE (GLUCOTROL) 5 MG tablet TAKE 1 TABLET BY MOUTH ONCE A DAY 90 tablet 3 02/24/2016 at Unknown time  . imipramine (TOFRANIL) 25 MG tablet Take 1 tablet (25 mg total) by mouth 2 (two) times daily. (Patient taking differently: Take 25 mg by mouth at bedtime. ) 60 tablet 5 02/23/2016 at Unknown time  . LORazepam (ATIVAN) 0.5 MG tablet Take 0.5 mg by mouth 2 (two) times daily as needed for anxiety.   prn at prn  . nitrofurantoin, macrocrystal-monohydrate, (MACROBID) 100 MG capsule Take 1 capsule (100 mg total) by mouth 2 (two) times daily. 10 capsule 0 unknown at unknown  . simvastatin (ZOCOR) 20 MG tablet Take 20 mg by mouth at bedtime. Reported on 08/17/2015 1 tablet 1 02/23/2016 at Unknown time   Scheduled:  . budesonide  6 mg Oral Daily  . buPROPion  150 mg Oral BID  . cefTRIAXone (ROCEPHIN)  IV  1 g Intravenous Q24H  . escitalopram  20 mg Oral Daily  . feeding supplement (ENSURE ENLIVE)  237 mL Oral BID BM  . heparin  5,000 Units Subcutaneous Q8H  . imipramine  25 mg Oral QHS  . insulin aspart  0-5 Units Subcutaneous QHS  .  insulin aspart  0-9 Units Subcutaneous TID WC  . mouth rinse  15 mL Mouth Rinse BID  . montelukast  10 mg Oral Daily  . OLANZapine  5 mg Oral QHS  . saccharomyces boulardii  250 mg Oral BID  . simvastatin  20 mg Oral QHS  . sodium chloride flush  3 mL Intravenous Q12H   Assessment: Pharmacy consulted to dose and monitor rocephin in this 83 year old female  Goal of Therapy:    Plan:  Will start Rocephin 1 g IV q24 hours.    Rashauna Tep D 02/28/2016,4:27 PM

## 2016-02-29 LAB — GLUCOSE, CAPILLARY
GLUCOSE-CAPILLARY: 79 mg/dL (ref 65–99)
GLUCOSE-CAPILLARY: 83 mg/dL (ref 65–99)
Glucose-Capillary: 83 mg/dL (ref 65–99)
Glucose-Capillary: 86 mg/dL (ref 65–99)

## 2016-02-29 LAB — CBC
HEMATOCRIT: 38.7 % (ref 35.0–47.0)
Hemoglobin: 12.6 g/dL (ref 12.0–16.0)
MCH: 28 pg (ref 26.0–34.0)
MCHC: 32.7 g/dL (ref 32.0–36.0)
MCV: 85.6 fL (ref 80.0–100.0)
PLATELETS: 162 10*3/uL (ref 150–440)
RBC: 4.52 MIL/uL (ref 3.80–5.20)
RDW: 16.5 % — AB (ref 11.5–14.5)
WBC: 4.8 10*3/uL (ref 3.6–11.0)

## 2016-02-29 LAB — URINE CULTURE: Culture: >=100000 — AB

## 2016-02-29 MED ORDER — CEFTRIAXONE SODIUM-DEXTROSE 1-3.74 GM-% IV SOLR
1.0000 g | INTRAVENOUS | Status: DC
  Administered 2016-02-29 – 2016-03-01 (×2): 1 g via INTRAVENOUS
  Filled 2016-02-29 (×2): qty 50

## 2016-02-29 NOTE — Progress Notes (Signed)
Dr. Tressia Miners made aware pt's CBG 83, no new orders.  Clarise Cruz, RN

## 2016-02-29 NOTE — Progress Notes (Signed)
Inpatient Diabetes Program Recommendations  AACE/ADA: New Consensus Statement on Inpatient Glycemic Control (2015)  Target Ranges:  Prepandial:   less than 140 mg/dL      [facility] postprandial:   less than 180 mg/dL (1-2 hours)      Critically ill patients:  140 - 180 mg/dL   Results for Deborah [name], Deborah [name] (MRN [id number]) as of 02/29/2016 12:14  Ref. Range 02/28/2016 07:51 02/28/2016 08:37 02/28/2016 09:19 02/28/2016 10:09 02/28/2016 11:43 02/28/2016 16:34 02/28/2016 21:12 02/29/2016 07:38 02/29/2016 11:52 02/29/2016 11:55  Glucose-Capillary Latest Ref Range: 65 - 99 mg/dL 61 (L) 69 62 (L) 91 89 113 (H) 78 79 <10 (LL) 83   Review of Glycemic Control  Diabetes history: DM2 Outpatient Diabetes medications: Glipizide 5 mg daily Current orders for Inpatient glycemic control: Novolog 0-9 units TID with meals, Novolog 0-5 units QHS  Inpatient Diabetes Program Recommendations:  Insulin-Correction: Patient has not received any insulin or hypoglycemic medications since being admitted but experiencing recurrent hypoglycemia. Please discontinue Novolog correction and continue CBG monitoring. Inpatient Consult: If patient continues to have recurrent hypoglycemia, MD may want to call Dr. Gabriel Carina or Dr. Graceann Congress (local Endocrinologist) and ask them to review chart for their input and recommendations. MD, please note that neither Dr. Gabriel Carina or Dr. Graceann Congress come into the hospital an longer (with exception that Dr. Gabriel Carina will come to assist with her patients on an insulin pump) and will need to be called directly by MD to discuss patient.  Thanks, Barnie Alderman, RN, MSN, CDE Diabetes Coordinator Inpatient Diabetes Program [phone] (Team Pager from 8am to 5pm)

## 2016-02-29 NOTE — Progress Notes (Signed)
Deborah [name] at [place] NAME: Deborah [name]    MR#:  [id number]  DATE OF BIRTH:  [birth year]  SUBJECTIVE:  CHIEF COMPLAINT:   Chief Complaint  Patient presents with  . Nausea  . Emesis  . Diarrhea   -remains confused, delirious and agitated - Husband at bedside, poor oral intake- low sugars  REVIEW OF SYSTEMS:  Review of Systems  Unable to perform ROS: Dementia    DRUG ALLERGIES:   Allergies  Allergen Reactions  . Ace Inhibitors Cough  . Ciprofloxacin Hcl Other (See Comments)    Reaction: unknown  . Quinolones Other (See Comments)    Reaction: Tendon rupture    VITALS:  Blood pressure (!) 139/43, pulse 97, temperature 97.5 F (36.4 C), temperature source Oral, resp. rate 18, height 5' 6"  (1.676 m), weight (!) 150 kg (330 lb 12.8 oz), SpO2 97 %.  PHYSICAL EXAMINATION:  Physical Exam   GENERAL:  83 y.o.-year-old obese patient lying in the bed with no acute distress.  EYES: Pupils equal, round, reactive to light and accommodation. No scleral icterus. Extraocular muscles intact.  HEENT: Head atraumatic, normocephalic. Oropharynx and nasopharynx clear.  NECK:  Supple, no jugular venous distention. No thyroid enlargement, no tenderness.  LUNGS: Normal breath sounds bilaterally, no wheezing, rales,rhonchi or crepitation. No use of accessory muscles of respiration.  CARDIOVASCULAR: S1, S2 normal. No murmurs, rubs, or gallops.  ABDOMEN: Soft, obese abdomen, nontender, nondistended. Bowel sounds present. No organomegaly or mass.  EXTREMITIES: No pedal edema, cyanosis, or clubbing.  NEUROLOGIC: No new focal neurological deficits, follows commands PSYCHIATRIC: The patient is alert and oriented to self. Very confused and agitated SKIN: No obvious rash, lesion, or ulcer.    LABORATORY PANEL:   CBC  Recent Labs Lab 02/29/16 0358  WBC 4.8  HGB 12.6  HCT 38.7  PLT 162    ------------------------------------------------------------------------------------------------------------------  Chemistries   Recent Labs Lab 02/24/16 1808 02/25/16 0735  02/27/16 0550  NA 139 140  < > 139  K 4.0 2.5*  < > 4.1  CL 102 104  < > 105  CO2 26 28  < > 25  GLUCOSE 166* 86  < > 82  BUN 15 18  < > 14  CREATININE 1.21* 1.26*  < > 0.96  CALCIUM 8.8* 8.2*  < > 9.0  MG  --  1.7  --   --   AST 23  --   --   --   ALT 13*  --   --   --   ALKPHOS 63  --   --   --   BILITOT 1.5*  --   --   --   < > = values in this interval not displayed. ------------------------------------------------------------------------------------------------------------------  Cardiac Enzymes  Recent Labs Lab 02/24/16 1808  TROPONINI <0.03   ------------------------------------------------------------------------------------------------------------------  RADIOLOGY:  No results found.  EKG:   Orders placed or performed during the hospital encounter of 02/24/16  . EKG 12-Lead  . EKG 12-Lead    ASSESSMENT AND PLAN:   83 year old female with past medical history significant for dementia, anxiety and depression, hypertension, diabetes, CK D, neuropathy presents from home secondary to nausea vomiting and diarrhea and noted to have multiple falls.  #1 Acute viral gastroenteritis-  Improving. -C. difficile is negative. GI panel with no norovirus. Continue symptomatic treatment -Imodium as needed.  #2 acute cystitis-urine cultures are growing Escherichia coli.. on rocephin now  #3  Poor appetite- failure to thrive-  palliative care consulted - sugars have been running low- if continues to have poor oral intake- discuss hospice  #4 metabolic encephalopathy And acute delirium-from infection and also underlying dementia worsening. -normal ammonia level. No focal deficits. Not responded to risperidone, ativan or haldol. On Zyprexa with not much improvement either CPAP at bedtime Psych  consult for delirium and agitation  #5 depression and anxiety-continue home medications. Patient on Wellbutrin and Lexapro.   #6 hypertension-blood pressure is normal. Continue to hold Norvasc  #7 diabetes mellitus-hold glipizide as poor oral intake and sugars have been on low normal side.  - also poor oral intake  #8 DVT prophylaxis-on subcutaneous heparin  Physical therapy consulted. Family interested in placement. Social worker aware. Needs long term care- but if poor oral intake- consider hospice .  All the records are reviewed and case discussed with Care Management/Social Workerr. Management plans discussed with the patient, family and they are in agreement.  CODE STATUS: DNR  TOTAL TIME TAKING CARE OF THIS PATIENT: 33 minutes.   POSSIBLE D/C in 1-2 days, DEPENDING ON CLINICAL CONDITION.   Gladstone Lighter M.D on 02/29/2016 at 2:08 PM  Between 7am to 6pm - Pager - [phone]  After 6pm go to [email or web address] - password [email or web address]  Office  [phone]  CC: Primary care physician; Wilhemena Durie, MD

## 2016-03-01 LAB — GLUCOSE, CAPILLARY
GLUCOSE-CAPILLARY: 75 mg/dL (ref 65–99)
GLUCOSE-CAPILLARY: 104 mg/dL — AB (ref 65–99)
Glucose-Capillary: 71 mg/dL (ref 65–99)
Glucose-Capillary: 99 mg/dL (ref 65–99)

## 2016-03-01 MED ORDER — QUETIAPINE FUMARATE 25 MG PO TABS
25.0000 mg | ORAL_TABLET | Freq: Every day | ORAL | Status: DC
  Administered 2016-03-01: 25 mg via ORAL
  Filled 2016-03-01: qty 1

## 2016-03-01 MED ORDER — MIRTAZAPINE 15 MG PO TABS
7.5000 mg | ORAL_TABLET | Freq: Every day | ORAL | Status: DC
  Administered 2016-03-01: 21:00:00 7.5 mg via ORAL
  Filled 2016-03-01: qty 1

## 2016-03-01 NOTE — Clinical Social Work Note (Addendum)
CSW spoke to patient's husband Deborah [name] and her son Deborah [name] in regards to SNF offer.  Patient's husband does not want her to go to [facility] now.  Patient's husband is requesting [facility] [place].  CSW informed patient's husband that CSW will speak with [facility] to see if they are able to accept patient and if insurance will approve her to go to new SNF preference.  CSW explained to patient that even though [facility] will accept her for short term rehab, they can not guarantee a long term care bed.  Patient's husband and son were both made aware of this and are still in agreement to going to [facility].  CSW also informed patient's family that she will be in her copay days in 5 days, CSW explained to patient's husband and son that the facility will discuss with them how to pay for the copays.  CSW was also informed that patient's family will be meeting with a Elder Engineer, mining to begin process for applying for Medicaid since patient will need long term care at a SNF.  CSW informed patient's family that if patient becomes a long term care resident at a SNF, they would have to usually pay a set number of weeks ahead of time and if Medicaid is approved they can be reimbursed, but family is still speaking with a lawyer to confirm this.    CSW contacted [facility] and spoke to [place] at [phone] ext [phone], she received updated clinical notes and gave authorization number of [id number] for patient to go to [facility].  CSW spoke to [facility] and they can accept patient on Friday if she is medically ready for discharge and orders have been received.  CSW is requesting Palliative to follow patient at SNF per recommendatino from Palliative team at hospital.  CSW to continue to follow patient's progress throughout discharge planning.  Jones Broom. [place], MSW, [place]  03/01/2016 4:00pm

## 2016-03-01 NOTE — Care Management Important Message (Signed)
Important Message  Patient Details  Name: Deborah [name] MRN: [id number] Date of Birth: 11/16/1930   Medicare Important Message Given:  Yes    Shelbie Ammons, RN 03/01/2016, 9:58 AM

## 2016-03-01 NOTE — Progress Notes (Signed)
Physical Therapy Treatment Patient Details Name: Deborah [name] MRN: [id number] DOB: 01-Jun-1930 Today's Date: 03/01/2016    History of Present Illness Pt admitted to hospital w/ sepsis due to UTI. Has history of obesity.    PT Comments    Pt in bed.  Keeps eyes closed unless cues when she briefly opens them.  Bed mobility with mas a x 2.  Sitting with cga x 2 with occasional post lean.  Pt was able to stand this am with max a x 2 to stand and mod a x 2 to maintain balance with hand held assist.  Attempted sidestepping at edge of bed but she was unable to maintain balance even with assist.  Participated in exercises as described below.    Follow Up Recommendations  SNF     Equipment Recommendations       Recommendations for Other Services       Precautions / Restrictions Precautions Precautions: Fall Restrictions Weight Bearing Restrictions: No    Mobility  Bed Mobility Overal bed mobility: Needs Assistance Bed Mobility: Supine to Sit;Sit to Supine     Supine to sit: +2 for physical assistance;Max assist Sit to supine: +2 for physical assistance;Max assist   General bed mobility comments: requires frequent verbal cues to assist with bed mobility, able to initiate motion but requires assistance due to weakness  Transfers Overall transfer level: Needs assistance Equipment used: 2 person hand held assist Transfers: Sit to/from Stand           General transfer comment: able to clear bed and stand fully this am but significant post and left lean. attemtped stepping in place but unable to maintain balance with asisst.  Ambulation/Gait             General Gait Details: Did not attempt   Stairs            Wheelchair Mobility    Modified Rankin (Stroke Patients Only)       Balance Overall balance assessment: Needs assistance;History of Falls Sitting-balance support: Bilateral upper extremity supported;Feet supported Sitting balance-Leahy Scale:  Fair Sitting balance - Comments: able to remain sitting without assist today.   Standing balance support: Bilateral upper extremity supported Standing balance-Leahy Scale: Poor Standing balance comment: able to remain standing with assist but unable to step in place                    Cognition Arousal/Alertness: Lethargic Behavior During Therapy: Flat affect Overall Cognitive Status: No family/caregiver present to determine baseline cognitive functioning                      Exercises Other Exercises Other Exercises: supine therex, AROM, 1x10; SLR, hip abd, SAQs, Heel Slides to improve LE strength for functional mobility     General Comments        Pertinent Vitals/Pain Pain Assessment: No/denies pain    Home Living                      Prior Function            PT Goals (current goals can now be found in the care plan section)      Frequency    Min 2X/week      PT Plan      Co-evaluation             End of Session Equipment Utilized During Treatment: Gait belt Activity Tolerance: Patient limited by  lethargy Patient left: in bed;with call bell/phone within reach;with bed alarm set     Time: 1030-1053 PT Time Calculation (min) (ACUTE ONLY): 23 min  Charges:  $Therapeutic Exercise: 8-22 mins $Therapeutic Activity: 8-22 mins                    G Codes:      Chesley Noon 2016-03-30, 10:58 AM

## 2016-03-01 NOTE — Progress Notes (Signed)
[facility] [place] NAME: Deborah [name]    MR#:  [id number]  DATE OF BIRTH:  07/05/1930  SUBJECTIVE:  CHIEF COMPLAINT:   Chief Complaint  Patient presents with  . Nausea  . Emesis  . Diarrhea   - sleeping this AM, received haldol last night - awaiting placement  REVIEW OF SYSTEMS:  Review of Systems  Unable to perform ROS: Dementia    DRUG ALLERGIES:   Allergies  Allergen Reactions  . Ace Inhibitors Cough  . Ciprofloxacin Hcl Other (See Comments)    Reaction: unknown  . Quinolones Other (See Comments)    Reaction: Tendon rupture    VITALS:  Blood pressure (!) 158/64, pulse 79, temperature 97.7 F (36.5 C), temperature source Oral, resp. rate 20, height 5' 6"  (1.676 m), weight (!) 150 kg (330 lb 12.8 oz), SpO2 94 %.  PHYSICAL EXAMINATION:  Physical Exam   GENERAL:  83 year old obese patient lying in the bed with no acute distress.  EYES: Pupils equal, round, reactive to light and accommodation. No scleral icterus. Extraocular muscles intact.  HEENT: Head atraumatic, normocephalic. Oropharynx and nasopharynx clear.  NECK:  Supple, no jugular venous distention. No thyroid enlargement, no tenderness.  LUNGS: Normal breath sounds bilaterally, no wheezing, rales,rhonchi or crepitation. No use of accessory muscles of respiration.  CARDIOVASCULAR: S1, S2 normal. No murmurs, rubs, or gallops.  ABDOMEN: Soft, obese abdomen, nontender, nondistended. Bowel sounds present. No organomegaly or mass.  EXTREMITIES: No pedal edema, cyanosis, or clubbing.  NEUROLOGIC: No new focal neurological deficits, follows commands PSYCHIATRIC: The patient is sleeping today,easily arousable. SKIN: No obvious rash, lesion, or ulcer.    LABORATORY PANEL:   CBC  Recent Labs Lab 02/29/16 0358  WBC 4.8  HGB 12.6  HCT 38.7  PLT 162    ------------------------------------------------------------------------------------------------------------------  Chemistries   Recent Labs Lab 02/24/16 1808 02/25/16 0735  02/27/16 0550  NA 139 140  < > 139  K 4.0 2.5*  < > 4.1  CL 102 104  < > 105  CO2 26 28  < > 25  GLUCOSE 166* 86  < > 82  BUN 15 18  < > 14  CREATININE 1.21* 1.26*  < > 0.96  CALCIUM 8.8* 8.2*  < > 9.0  MG  --  1.7  --   --   AST 23  --   --   --   ALT 13*  --   --   --   ALKPHOS 63  --   --   --   BILITOT 1.5*  --   --   --   < > = values in this interval not displayed. ------------------------------------------------------------------------------------------------------------------  Cardiac Enzymes  Recent Labs Lab 02/24/16 1808  TROPONINI <0.03   ------------------------------------------------------------------------------------------------------------------  RADIOLOGY:  No results found.  EKG:   Orders placed or performed during the hospital encounter of 02/24/16  . EKG 12-Lead  . EKG 12-Lead    ASSESSMENT AND PLAN:   83 year old female with past medical history significant for dementia, anxiety and depression, hypertension, diabetes, CK D, neuropathy presents from home secondary to nausea vomiting and diarrhea and noted to have multiple falls.  #1 Acute viral gastroenteritis-  Improving. -C. difficile is negative. GI panel with no norovirus. Continue symptomatic treatment -Imodium as needed.  #2 acute cystitis-urine cultures are growing Escherichia coli.. on rocephin now  #3  Poor appetite- failure to thrive- palliative care consulted - sugars have been running low-  if continues to have poor oral intake- discuss hospice  #4 metabolic encephalopathy And acute delirium-from infection and also underlying dementia worsening. -normal ammonia level. No focal deficits. Not responded to risperidone, ativan or haldol. On Zyprexa with not much improvement either CPAP at bedtime Psych  consult for delirium and agitation  #5 depression and anxiety-continue home medications. Patient on Wellbutrin and Lexapro.   #6 hypertension-blood pressure is normal. Continue to hold Norvasc  #7 diabetes mellitus-hold glipizide as poor oral intake and sugars have been on low normal side.  - also poor oral intake  #8 DVT prophylaxis-on subcutaneous heparin  Physical therapy consulted. Family interested in placement. Social worker aware. Needs long term care .  All the records are reviewed and case discussed with Care Management/Social Workerr. Management plans discussed with the patient, family and they are in agreement.  CODE STATUS: DNR  TOTAL TIME TAKING CARE OF THIS PATIENT: 33 minutes.   POSSIBLE D/C in 1-2 days, DEPENDING ON CLINICAL CONDITION.   Gladstone Lighter M.D on 03/01/2016 at 7:47 AM  Between 7am to 6pm - Pager - [phone]  After 6pm go to [email or web address] - password [email or web address]  Office  [phone]  CC: Primary care physician; Wilhemena Durie, MD

## 2016-03-01 NOTE — Progress Notes (Signed)
New referral for palliative services at [facility] following discharge received from [place]. Discharge is planned for tomorrow. Referral made aware. Thank you. Flo Shanks Rn, BSN, [facility] liaison [phone]

## 2016-03-01 NOTE — Progress Notes (Signed)
Daily Progress Note   Patient Name: Deborah [name]       Date: 03/01/2016 DOB: 09-Sep-1930  Age: 83 y.o. MRN#: [id number] Attending Physician: Gladstone Lighter, MD Primary Care Physician: Wilhemena Durie, MD Admit Date: 02/24/2016  Reason for Consultation/Follow-up: Establishing goals of care   Subjective: Upon arrival to room, husband and son at bedside. Patient drowsy but easily wakes to voice. She is disoriented. Per chart review and discussion with RN, blood sugars stable and patient ate breakfast. No signs or symptoms of pain.   Husband and son speak of their frustrations with her having to go to rehab before long term care bed. "She has been [place] for three months with no improvement." Discussed that unfortunately this is the process and I will ask social work to further discuss this with them. Explained palliative services to follow outpatient and transition to hospice services when appropriate. They are agreeable with this plan. They understand her dementia is worsening and she requires more care that they cannot provide for her at home. Discussed and completed MOST form- DNR/DNI, no feeding tube, hospitalization/antibiotics/IVF for time trial if she should decline.   Length of Stay: 6  Current Medications: Scheduled Meds:  . budesonide  6 mg Oral Daily  . cefTRIAXone  1 g Intravenous Q24H  . escitalopram  20 mg Oral Daily  . feeding supplement (ENSURE ENLIVE)  237 mL Oral BID BM  . heparin  5,000 Units Subcutaneous Q8H  . imipramine  25 mg Oral QHS  . mouth rinse  15 mL Mouth Rinse BID  . mirtazapine  7.5 mg Oral QHS  . montelukast  10 mg Oral Daily  . QUEtiapine  25 mg Oral QHS  . saccharomyces boulardii  250 mg Oral BID  . simvastatin  20 mg Oral QHS  . sodium chloride  flush  3 mL Intravenous Q12H    Continuous Infusions:   PRN Meds: acetaminophen **OR** acetaminophen, haloperidol lactate, loperamide, ondansetron **OR** ondansetron (ZOFRAN) IV  Physical Exam  Constitutional: She is easily aroused.  HENT:  Head: Normocephalic and atraumatic.  Cardiovascular: Regular rhythm and normal heart sounds.   Pulmonary/Chest: Effort normal. She has decreased breath sounds.  Abdominal: Normal appearance.  Neurological: She is alert and easily aroused. She is disoriented.  Skin: Skin is warm and dry. There is  pallor.  Psychiatric: Her speech is delayed. Cognition and memory are impaired. She is inattentive.  Nursing note and vitals reviewed.   Vital Signs: BP (!) 144/59   Pulse 75   Temp 98.7 F (37.1 C) (Oral)   Resp 20   Ht 5' 6"  (1.676 m)   Wt (!) 150 kg (330 lb 12.8 oz)   SpO2 100%   BMI 53.39 kg/m  SpO2: SpO2: 100 % O2 Device: O2 Device: Not Delivered O2 Flow Rate: O2 Flow Rate (L/min): 1.5 L/min  Intake/output summary:   Intake/Output Summary (Last 24 hours) at 03/01/16 1715 Last data filed at 02/29/16 1820  Gross per 24 hour  Intake               50 ml  Output                0 ml  Net               50 ml   LBM: Last BM Date: 03/01/16 Baseline Weight: Weight: (!) 153.8 kg (339 lb) Most recent weight: Weight: (!) 150 kg (330 lb 12.8 oz)       Palliative Assessment/Data: PPS 30%   Flowsheet Rows   Flowsheet Row Most Recent Value  Intake Tab  Referral Department  Hospitalist  Unit at Time of Referral  Oncology Unit  Palliative Care Primary Diagnosis  Neurology  Date Notified  02/27/16  Palliative Care Type  New Palliative care  Reason for referral  Clarify Goals of Care  Date of Admission  02/24/16  Date first seen by Palliative Care  02/28/16  # of days Palliative referral response time  1 Day(s)  # of days IP prior to Palliative referral  3  Clinical Assessment  Palliative Performance Scale Score  30%  Psychosocial &  Spiritual Assessment  Palliative Care Outcomes  Patient/Family meeting held?  Yes  Who was at the meeting?  husband and son   Palliative Care Outcomes  Clarified goals of care, Counseled regarding hospice, Provided end of life care assistance, Provided psychosocial or spiritual support, Completed durable DNR, Linked to palliative care logitudinal support, ACP counseling assistance      Patient Active Problem List   Diagnosis Date Noted  . Lewy body dementia without behavioral disturbance   . Palliative care by specialist   . DNR (do not resuscitate)   . Goals of care, counseling/discussion   . Sepsis ([place]) 02/24/2016  . Urinary tract infection without hematuria 02/24/2016  . Memory loss 07/18/2015  . Depression 01/12/2015  . Shoulder pain 12/17/2014  . Pain in shoulder 08/05/2014  . Gonalgia 07/21/2014  . History of knee surgery 07/21/2014  . Allergic rhinitis 07/16/2014  . Anxiety 07/16/2014  . Benign hypertension 07/16/2014  . Alteration in bowel elimination: incontinence 07/16/2014  . Chronic kidney disease (CKD), stage III (moderate) 07/16/2014  . Colitis 07/16/2014  . CN (constipation) 07/16/2014  . D (diarrhea) 07/16/2014  . Contracture of palmar fascia (Dupuytren's) 07/16/2014  . Hypercholesteremia 07/16/2014  . Hypoxia 07/16/2014  . Absence of bladder continence 07/16/2014  . Focal lymphocytic colitis 07/16/2014  . Extreme obesity ([place]) 07/16/2014  . Arthritis, degenerative 07/16/2014  . Disorder of peripheral nervous system ([place]) 07/16/2014  . Neuralgia neuritis, sciatic nerve 07/16/2014  . Hyperparathyroidism, secondary ([place]) 07/16/2014  . Apnea, sleep 07/16/2014  . Subclinical hypothyroidism 07/16/2014  . Controlled type 2 diabetes mellitus ([place]) 07/16/2014  . 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 Urinary incontinence without sensory awareness 09/03/2012  .  Palpitations 12/28/2010  . SOB (shortness of breath) 12/28/2010  . Overweight 08/02/2009    Palliative  Care Assessment & Plan   Patient Profile: 83 y.o. female with past medical history of dementia, diabetes mellitus, neuropathy, chronic kidney disease stage III, hypertension, hyperlipidemia, anxiety, and depression admitted on 02/24/2016 with nausea, vomiting, and diarrhea. GI/cdiff panel negative-acute viral gastroenteritis. Positive for UTI-receiving meropenem. Patient with metabolic encephalopathy from UTI with underlying dementia. Palliative medicine consultation for goals of care.    Assessment: Metabolic encephalopathy Acute viral gastroenteritis UTI Dementia Falls  Recommendations/Plan:  DNR/DNI  Durable DNR and MOST form completed and placed in chart.   Plan is SNF with palliative to follow-family agreeable.   PMT will continue to shadow chart and follow up if patient should decline.   Code Status: DNR/DNI   Code Status Orders        Start     Ordered   02/28/16 1152  Do not attempt resuscitation (DNR)  Continuous    Question Answer Comment  In the event of cardiac or respiratory ARREST Do not call a "code blue"   In the event of cardiac or respiratory ARREST Do not perform Intubation, CPR, defibrillation or ACLS   In the event of cardiac or respiratory ARREST Use medication by any route, position, wound care, and other measures to relive pain and suffering. May use oxygen, suction and manual treatment of airway obstruction as needed for comfort.      02/28/16 1151    Code Status History    Date Active Date Inactive Code Status Order ID Comments User Context   02/25/2016 12:05 AM 02/28/2016 11:51 AM Full Code [id number]  Lance Coon, MD Inpatient       Prognosis:   Unable to determine  Discharge Planning:  [place] for rehab with Palliative care service follow-up  Care plan was discussed with family, RN, SW, RN CM, and Dr. Tressia Miners   Thank you for allowing the Palliative Medicine Team to assist in the care of this patient.   Time In:  1145 Time Out: 1220 Total Time 74mn Prolonged Time Billed  no       Greater than 50%  of this time was spent counseling and coordinating care related to the above assessment and plan.  MIhor Dow FNP-C Palliative Medicine Team  Phone: [phone]: [phone] Please contact Palliative Medicine Team phone at [phone] questions and concerns.

## 2016-03-01 NOTE — Consult Note (Signed)
[facility] Consult   Reason for Consult:  Consult for 83 year old woman currently in the hospital with urinary tract infection and multiple chronic medical problems. Consult about agitation and confusion Referring Physician:  Tressia Miners Patient Identification: Deborah [name] MRN:  [id number] Principal Diagnosis: Lewy body dementia without behavioral disturbance Diagnosis:   Patient Active Problem List   Diagnosis Date Noted  . Lewy body dementia without behavioral disturbance [G31.83, F02.80]   . Palliative care by specialist [Z51.5]   . DNR (do not resuscitate) [Z66]   . Goals of care, counseling/discussion [Z71.89]   . Sepsis ([place]) [A41.9] 02/24/2016  . Urinary tract infection without hematuria [N39.0] 02/24/2016  . Memory loss [R41.3] 07/18/2015  . Depression [F32.9] 01/12/2015  . Shoulder pain [M25.519] 12/17/2014  . Pain in shoulder [M25.519] 08/05/2014  . Gonalgia [M25.569] 07/21/2014  . History of knee surgery [Z98.890] 07/21/2014  . Allergic rhinitis [J30.9] 07/16/2014  . Anxiety [F41.9] 07/16/2014  . Benign hypertension [I10] 07/16/2014  . Alteration in bowel elimination: incontinence [R15.9] 07/16/2014  . Chronic kidney disease (CKD), stage III (moderate) [N18.3] 07/16/2014  . Colitis [K52.9] 07/16/2014  . CN (constipation) [K59.00] 07/16/2014  . D (diarrhea) [R19.7] 07/16/2014  . Contracture of palmar fascia (Dupuytren's) [M72.0] 07/16/2014  . Hypercholesteremia [E78.00] 07/16/2014  . Hypoxia [R09.02] 07/16/2014  . Absence of bladder continence [R32] 07/16/2014  . Focal lymphocytic colitis [K52.832] 07/16/2014  . Extreme obesity ([place]) [E66.01] 07/16/2014  . Arthritis, degenerative [M19.90] 07/16/2014  . Disorder of peripheral nervous system ([place]) [G64] 07/16/2014  . Neuralgia neuritis, sciatic nerve [M54.30] 07/16/2014  . Hyperparathyroidism, secondary ([place]) [N25.81] 07/16/2014  . Apnea, sleep [G47.30] 07/16/2014  . Subclinical hypothyroidism [E03.9]  07/16/2014  . Controlled type 2 diabetes mellitus ([place]) [E11.9] 07/16/2014  . Detrusor dyssynergia [N32.81] 09/03/2012  . Urinary incontinence without sensory awareness [N39.42] 09/03/2012  . Palpitations [R00.2] 12/28/2010  . SOB (shortness of breath) [R06.02] 12/28/2010  . Overweight [E66.3] 08/02/2009    Total Time spent with patient: 1 hour  Subjective:   Deborah [name] is a 83 y.o. female patient admitted with I don't know".  HPI:  Patient interviewed. Also spoke with the husband who was in the room. Reviewed the chart and labs and MRI scan. 83 year old woman who apparently doesn't have a past psychiatric history. She is currently in the hospital for multiple medical problems and poor oral intake. Reportedly she has been confused and agitated at times and medications have been only partially helpful. The patient herself was not able to give much information. She is confused and a little bit delirious and demented. Does not know where she is right now. Husband tells me that for about 6 months there has been a rapid decline in her overall mental state. She has had a profound cognitive decline and been more and more confused. She went from being able to ambulate without much difficulty to announcing that she could not walk at all very quickly. Seen multiple practitioners including a neurologist and feel that having gotten a clear explanation. Patient has been treated with antidepressant medicine for years and recently there've been some adjustments to that but it does not seem to have made a clear difference.  Social history: Patient lives at home with her husband. She had previously worked doing a Herbalist job and home husband emphasizes that her baseline intelligence was very good. She has at least 1 adult son and I briefly met.  Medical history: Morbid obesity. Diabetes. Hypertension. Urinary tract infection  Substance  abuse history: No known current or past substance abuse  issues  Past Psychiatric History: Patient has no history of hospitalization no history of suicide attempts no history of psychotic disorder. Had been on Lexapro for "years" for treatment of anxiety and depression. Husband is unclear as to how well it hadn't worked. Recently she has also been started on bupropion apparently in an attempt to treat the depression. Not clear that it has made any difference. In the hospital she has been put on olanzapine at night probably for depression as well as agitation and sleep.  Risk to Self: Is patient at risk for suicide?: No Risk to Others:   Prior Inpatient Therapy:   Prior Outpatient Therapy:    Past Medical History:  Past Medical History:  Diagnosis Date  . Anxiety   . Chronic kidney disease, stage III (moderate)   . Contracture of palmar fascia   . Cough   . Diabetes mellitus   . Hyperlipidemia   . Hypertension   . Obesity   . Osteoarthritis   . Other specified acquired hypothyroidism   . Otitis externa   . Palpitations   . Peripheral neuropathy ([place])   . Pure hypercholesterolemia   . Secondary hyperparathyroidism (of renal origin)   . Sleep apnea   . Unspecified adjustment reaction   . Unspecified hereditary and idiopathic peripheral neuropathy   . Unspecified urinary incontinence     Past Surgical History:  Procedure Laterality Date  . BACK SURGERY  1990   Ruptured Disc.  Marland Kitchen CARDIAC CATHETERIZATION  03/2008  . COLONOSCOPY    . HAND SURGERY Right   . REPLACEMENT TOTAL KNEE Bilateral   . SHOULDER SURGERY Right 06/2007   Family History:  Family History  Problem Relation Age of Onset  . Heart attack Father   . Hypertension Sister   . Hypertension Sister   . Alzheimer's disease Mother    Family Psychiatric  History: No known family history of mental illness Social History:  History  Alcohol Use No     History  Drug Use No    Social History   Social History  . Marital status: Married    Spouse name: N/A  . Number of  children: 2  . Years of education: N/A   Occupational History  . Retired [employer]    Worked in Proofreader.    Social History Main Topics  . Smoking status: Former Smoker    Packs/day: 10.00    Years: 1.00    Types: Cigarettes    Quit date: 12/27/1997  . Smokeless tobacco: Never Used  . Alcohol use No  . Drug use: No  . Sexual activity: No   Other Topics Concern  . None   Social History Narrative  . None   Additional Social History:    Allergies:   Allergies  Allergen Reactions  . Ace Inhibitors Cough  . Ciprofloxacin Hcl Other (See Comments)    Reaction: unknown  . Quinolones Other (See Comments)    Reaction: Tendon rupture    Labs:  Results for orders placed or performed during the hospital encounter of 02/24/16 (from the past 48 hour(s))  Glucose, capillary     Status: None   Collection Time: 02/28/16  9:12 PM  Result Value Ref Range   Glucose-Capillary 78 65 - 99 mg/dL  CBC     Status: Abnormal   Collection Time: 02/29/16  3:58 AM  Result Value Ref Range   WBC 4.8 3.6 - 11.0 K/uL  RBC 4.52 3.80 - 5.20 MIL/uL   Hemoglobin 12.6 12.0 - 16.0 g/dL   HCT 38.7 35.0 - 47.0 %   MCV 85.6 80.0 - 100.0 fL   MCH 28.0 26.0 - 34.0 pg   MCHC 32.7 32.0 - 36.0 g/dL   RDW 16.5 (H) 11.5 - 14.5 %   Platelets 162 150 - 440 K/uL  Glucose, capillary     Status: None   Collection Time: 02/29/16  7:38 AM  Result Value Ref Range   Glucose-Capillary 79 65 - 99 mg/dL  Glucose, capillary     Status: Abnormal   Collection Time: 02/29/16 11:52 AM  Result Value Ref Range   Glucose-Capillary <10 (LL) 65 - 99 mg/dL  Glucose, capillary     Status: None   Collection Time: 02/29/16 11:55 AM  Result Value Ref Range   Glucose-Capillary 83 65 - 99 mg/dL  Glucose, capillary     Status: None   Collection Time: 02/29/16  4:51 PM  Result Value Ref Range   Glucose-Capillary 83 65 - 99 mg/dL  Glucose, capillary     Status: None   Collection Time: 02/29/16  8:28 PM  Result  Value Ref Range   Glucose-Capillary 86 65 - 99 mg/dL  Glucose, capillary     Status: None   Collection Time: 03/01/16  8:23 AM  Result Value Ref Range   Glucose-Capillary 71 65 - 99 mg/dL  Glucose, capillary     Status: None   Collection Time: 03/01/16 12:20 PM  Result Value Ref Range   Glucose-Capillary 99 65 - 99 mg/dL    Current Facility-Administered Medications  Medication Dose Route Frequency Provider Last Rate Last Dose  . acetaminophen (TYLENOL) tablet 650 mg  650 mg Oral Q6H PRN Lance Coon, MD       Or  . acetaminophen (TYLENOL) suppository 650 mg  650 mg Rectal Q6H PRN Lance Coon, MD      . budesonide (ENTOCORT EC) 24 hr capsule 6 mg  6 mg Oral Daily Lance Coon, MD   6 mg at 03/01/16 1039  . cefTRIAXone (ROCEPHIN) IVPB 1 g  1 g Intravenous Q24H Gladstone Lighter, MD   1 g at 02/29/16 1820  . escitalopram (LEXAPRO) tablet 20 mg  20 mg Oral Daily Lance Coon, MD   20 mg at 03/01/16 1039  . feeding supplement (ENSURE ENLIVE) (ENSURE ENLIVE) liquid 237 mL  237 mL Oral BID BM Gladstone Lighter, MD   237 mL at 03/01/16 1400  . haloperidol lactate (HALDOL) injection 2 mg  2 mg Intravenous Q6H PRN Gladstone Lighter, MD   2 mg at 02/29/16 2112  . heparin injection 5,000 Units  5,000 Units Subcutaneous Q8H Lance Coon, MD   5,000 Units at 03/01/16 0529  . imipramine (TOFRANIL) tablet 25 mg  25 mg Oral QHS Lance Coon, MD   25 mg at 02/29/16 2122  . loperamide (IMODIUM) capsule 2 mg  2 mg Oral Q6H PRN Gladstone Lighter, MD   2 mg at 02/26/16 2202  . MEDLINE mouth rinse  15 mL Mouth Rinse BID Gladstone Lighter, MD   15 mL at 03/01/16 1000  . mirtazapine (REMERON) tablet 7.5 mg  7.5 mg Oral QHS Gonzella Lex, MD      . montelukast (SINGULAIR) tablet 10 mg  10 mg Oral Daily Lance Coon, MD   10 mg at 03/01/16 1039  . ondansetron (ZOFRAN) tablet 4 mg  4 mg Oral Q6H PRN Lance Coon, MD  Or  . ondansetron (ZOFRAN) injection 4 mg  4 mg Intravenous Q6H PRN Lance Coon, MD       . QUEtiapine (SEROQUEL) tablet 25 mg  25 mg Oral QHS Gonzella Lex, MD      . saccharomyces boulardii (FLORASTOR) capsule 250 mg  250 mg Oral BID Lance Coon, MD   250 mg at 03/01/16 1039  . simvastatin (ZOCOR) tablet 20 mg  20 mg Oral QHS Lance Coon, MD   20 mg at 02/29/16 2112  . sodium chloride flush (NS) 0.9 % injection 3 mL  3 mL Intravenous Q12H Lance Coon, MD   3 mL at 03/01/16 1000    Musculoskeletal: Strength & Muscle Tone: decreased Gait & Station: unable to stand Patient leans: N/A  Psychiatric Specialty Exam: Physical Exam  Nursing note and vitals reviewed. Constitutional: She appears well-developed and well-nourished. She appears distressed.  HENT:  Head: Normocephalic and atraumatic.  Eyes: Conjunctivae are normal. Pupils are equal, round, and reactive to light.  Neck: Normal range of motion.  Cardiovascular: Regular rhythm.   Respiratory: Effort normal. No respiratory distress.  GI: Soft.  Musculoskeletal: Normal range of motion.  Neurological: She is alert.  Skin: Skin is warm and dry.  Psychiatric: Her mood appears anxious. Her affect is labile. Her speech is delayed and tangential. She is withdrawn. Cognition and memory are impaired. She expresses impulsivity. She expresses no homicidal and no suicidal ideation. She exhibits abnormal recent memory. She is inattentive.    Review of Systems  HENT: Negative.   Eyes: Negative.   Respiratory: Negative.   Cardiovascular: Negative.   Gastrointestinal: Negative.   Musculoskeletal: Negative.   Skin: Negative.   Neurological: Positive for weakness.  Psychiatric/Behavioral: Positive for hallucinations and memory loss. Negative for depression, substance abuse and suicidal ideas. The patient is nervous/anxious and has insomnia.     Blood pressure (!) 144/59, pulse 75, temperature 98.7 F (37.1 C), temperature source Oral, resp. rate 20, height 5' 6"  (1.676 m), weight (!) 150 kg (330 lb 12.8 oz), SpO2 100 %.Body  mass index is 53.39 kg/m.  General Appearance: Disheveled  Eye Contact:  Minimal  Speech:  Garbled and Slow  Volume:  Decreased  Mood:  Anxious and Dysphoric  Affect:  Constricted and Inappropriate  Thought Process:  Disorganized  Orientation:  Negative  Thought Content:  Illogical and Tangential  Suicidal Thoughts:  No  Homicidal Thoughts:  No  Memory:  Immediate;   Fair Recent;   Poor Remote;   Fair  Judgement:  Impaired  Insight:  Lacking  Psychomotor Activity:  Decreased  Concentration:  Concentration: Poor  Recall:  [employer] of Knowledge:  Fair  Language:  Fair  Akathisia:  No  Handed:  Right  AIMS (if indicated):     Assets:  Financial Resources/Insurance Housing Social Support  ADL's:  Impaired  Cognition:  Impaired,  Moderate  Sleep:        Treatment Plan Summary: Daily contact with patient to assess and evaluate symptoms and progress in treatment, Medication management and Plan After speaking with the patient I reviewed the chart again and see that a diagnosis of Lewy body dementia had been placed in the chart at some point. I think this is probably pretty high in the differential. As I explained to the patient and her husband it's possible that depression could be a component of this but I don't think it's the entirety of what has caused her cognitive decline. In the case that  this is a form of Lewy body dementia which seems quite possible it's probably better to avoid antipsychotics except for ones with the very least risk of dystonic reaction. I will discontinue the olanzapine and replace it with 25 mg of Seroquel at night. Since the Wellbutrin seems to of been of no difference so far and I don't want to put her at risk of age her, particularly if she is not eating well, I am discontinuing the Wellbutrin and replacing it with 7-1/2 mg of mirtazapine at night for mood and appetite. I will follow-up as needed.  Disposition: Patient does not meet criteria for  psychiatric inpatient admission. Supportive therapy provided about ongoing stressors.  Alethia Berthold, MD 03/01/2016 4:47 PM

## 2016-03-02 LAB — BASIC METABOLIC PANEL
Anion gap: 7 (ref 5–15)
BUN: 14 mg/dL (ref 6–20)
CALCIUM: 8.9 mg/dL (ref 8.9–10.3)
CHLORIDE: 100 mmol/L — AB (ref 101–111)
CO2: 33 mmol/L — AB (ref 22–32)
CREATININE: 0.9 mg/dL (ref 0.44–1.00)
GFR calc Af Amer: >60 mL/min (ref >60)
GFR calc non Af Amer: 58 mL/min — ABNORMAL LOW (ref >60)
GLUCOSE: 77 mg/dL (ref 65–99)
Potassium: 3.4 mmol/L — ABNORMAL LOW (ref 3.5–5.1)
Sodium: 140 mmol/L (ref 135–145)

## 2016-03-02 LAB — GLUCOSE, CAPILLARY
Glucose-Capillary: 80 mg/dL (ref 65–99)
Glucose-Capillary: 83 mg/dL (ref 65–99)

## 2016-03-02 LAB — CBC
HEMATOCRIT: 41.4 % (ref 35.0–47.0)
HEMOGLOBIN: 13.5 g/dL (ref 12.0–16.0)
MCH: 28.1 pg (ref 26.0–34.0)
MCHC: 32.6 g/dL (ref 32.0–36.0)
MCV: 86.3 fL (ref 80.0–100.0)
Platelets: 168 10*3/uL (ref 150–440)
RBC: 4.8 MIL/uL (ref 3.80–5.20)
RDW: 17.4 % — AB (ref 11.5–14.5)
WBC: 5.6 10*3/uL (ref 3.6–11.0)

## 2016-03-02 MED ORDER — QUETIAPINE FUMARATE 25 MG PO TABS: 25.0000 mg | ORAL_TABLET | Freq: Every day | ORAL | 2 refills | Status: DC

## 2016-03-02 MED ORDER — AMLODIPINE BESYLATE 2.5 MG PO TABS: 2.5000 mg | ORAL_TABLET | Freq: Two times a day (BID) | ORAL | 0 refills | Status: DC

## 2016-03-02 MED ORDER — POTASSIUM CHLORIDE CRYS ER 20 MEQ PO TBCR
40.0000 meq | EXTENDED_RELEASE_TABLET | Freq: Once | ORAL | Status: AC
  Administered 2016-03-02: 40 meq via ORAL
  Filled 2016-03-02: qty 2

## 2016-03-02 MED ORDER — ENSURE ENLIVE PO LIQD: 237.0000 mL | Freq: Two times a day (BID) | ORAL | 12 refills | Status: DC

## 2016-03-02 MED ORDER — HALOPERIDOL 2 MG PO TABS: 2.0000 mg | ORAL_TABLET | Freq: Three times a day (TID) | ORAL | 0 refills | Status: DC | PRN

## 2016-03-02 MED ORDER — MIRTAZAPINE 7.5 MG PO TABS: 7.5000 mg | ORAL_TABLET | Freq: Every day | ORAL | 2 refills | Status: DC

## 2016-03-02 MED ORDER — IMIPRAMINE HCL 25 MG PO TABS: 25.0000 mg | ORAL_TABLET | Freq: Every day | ORAL | 2 refills | Status: DC

## 2016-03-02 MED ORDER — MONTELUKAST SODIUM 10 MG PO TABS: 10.0000 mg | ORAL_TABLET | Freq: Every day | ORAL | 0 refills | Status: DC

## 2016-03-02 NOTE — Progress Notes (Signed)
Hisbandwill meet them at the SNF. Dc to [facility] via EMS

## 2016-03-02 NOTE — Clinical Social Work Note (Signed)
Patient to be d/c'ed today to [facility].  Patient and family agreeable to plans will transport via ems RN to call report to [phone] room [place].  Evette Cristal, MSW, [place]

## 2016-03-02 NOTE — Discharge Summary (Signed)
[facility] NAME: Deborah [name]    MR#:  [id number]  DATE OF BIRTH:  [birth year]  DATE OF ADMISSION:  02/24/2016   ADMITTING PHYSICIAN: Lance Coon, MD  DATE OF DISCHARGE: 03/02/2016  PRIMARY CARE PHYSICIAN: Wilhemena Durie, MD   ADMISSION DIAGNOSIS:   Weakness [R53.1] Hypoxia [R09.02] Urinary tract infection without hematuria, site unspecified [N39.0]  DISCHARGE DIAGNOSIS:   Principal Problem:   Lewy body dementia without behavioral disturbance Active Problems:   Anxiety   Benign hypertension   Chronic kidney disease (CKD), stage III (moderate)   Controlled type 2 diabetes mellitus ([place])   Sepsis (HCC)   Urinary tract infection without hematuria   Palliative care by specialist   DNR (do not resuscitate)   Goals of care, counseling/discussion   SECONDARY DIAGNOSIS:   Past Medical History:  Diagnosis Date  . Anxiety   . Chronic kidney disease, stage III (moderate)   . Contracture of palmar fascia   . Cough   . Diabetes mellitus   . Hyperlipidemia   . Hypertension   . Obesity   . Osteoarthritis   . Other specified acquired hypothyroidism   . Otitis externa   . Palpitations   . Peripheral neuropathy ([place])   . Pure hypercholesterolemia   . Secondary hyperparathyroidism (of renal origin)   . Sleep apnea   . Unspecified adjustment reaction   . Unspecified hereditary and idiopathic peripheral neuropathy   . Unspecified urinary incontinence     HOSPITAL COURSE:   83 year old female with past medical history significant for dementia, anxiety and depression, hypertension, diabetes, CK D, neuropathy presents from home secondary to nausea vomiting and diarrhea and noted to have multiple falls.  #1 Acute viral gastroenteritis-  Improved. -C. difficile is negative. GI panel with norovirus. Received symptomatic treatment -Imodium as needed. Improved now  #2 acute cystitis-urine cultures are growing  Escherichia coli.Roma Kayser rocephin  #3  Poor appetite- failure to thrive- palliative care consulted - sugars have been running low- encourage oral inatke - palliative care follow up at rehab  #4 metabolic encephalopathy And acute delirium-from infection and also underlying lewy body dementia worsening with hallucinations. -normal ammonia level. No focal deficits.CPAP at bedtime - meds adjusted with some improvement  #5 depression and anxiety-appreciate psych consult for delirium, hallucinations meds adjusted- better with lexapro and remeron at bedtime and also seroquel at bedtime   #6 hypertension-blood pressure is normal. Low dose norvasc restarted  #7 diabetes mellitus-hold glipizide as poor oral intake and sugars have been on low normal side.  -encourage oral intake   Physical therapy consulted. Going to [facility]. Needs long term care after rehab  DISCHARGE CONDITIONS:   Guarded  CONSULTS OBTAINED:   Treatment Team:  Gonzella Lex, MD  DRUG ALLERGIES:   Allergies  Allergen Reactions  . Ace Inhibitors Cough  . Ciprofloxacin Hcl Other (See Comments)    Reaction: unknown  . Quinolones Other (See Comments)    Reaction: Tendon rupture   DISCHARGE MEDICATIONS:   Allergies as of 03/02/2016      Reactions   Ace Inhibitors Cough   Ciprofloxacin Hcl Other (See Comments)   Reaction: unknown   Quinolones Other (See Comments)   Reaction: Tendon rupture      Medication List    STOP taking these medications   buPROPion 150 MG 12 hr tablet Commonly known as:  WELLBUTRIN SR   cetirizine 10 MG tablet Commonly known as:  ZYRTEC  glipiZIDE 5 MG tablet Commonly known as:  GLUCOTROL   LORazepam 0.5 MG tablet Commonly known as:  ATIVAN   nitrofurantoin (macrocrystal-monohydrate) 100 MG capsule Commonly known as:  MACROBID     TAKE these medications   acetaminophen 500 MG tablet Commonly known as:  TYLENOL Take 500 mg by mouth every 6 (six) hours as  needed for mild pain.   amLODipine 2.5 MG tablet Commonly known as:  NORVASC Take 1 tablet (2.5 mg total) by mouth 2 (two) times daily. What changed:  medication strength  how much to take   aspirin EC 81 MG tablet Take 81 mg by mouth at bedtime.   budesonide 3 MG 24 hr capsule Commonly known as:  ENTOCORT EC Take 2 capsules (6 mg total) by mouth daily. What changed:  how much to take  when to take this   escitalopram 20 MG tablet Commonly known as:  LEXAPRO Take 1 tablet (20 mg total) by mouth daily.   feeding supplement (ENSURE ENLIVE) Liqd Take 237 mLs by mouth 2 (two) times daily between meals.   haloperidol 2 MG tablet Commonly known as:  HALDOL Take 1 tablet (2 mg total) by mouth every 8 (eight) hours as needed for agitation.   imipramine 25 MG tablet Commonly known as:  TOFRANIL Take 1 tablet (25 mg total) by mouth at bedtime.   mirtazapine 7.5 MG tablet Commonly known as:  REMERON Take 1 tablet (7.5 mg total) by mouth at bedtime.   montelukast 10 MG tablet Commonly known as:  SINGULAIR Take 1 tablet (10 mg total) by mouth daily. What changed:  See the new instructions.   QUEtiapine 25 MG tablet Commonly known as:  SEROQUEL Take 1 tablet (25 mg total) by mouth at bedtime.   simvastatin 20 MG tablet Commonly known as:  ZOCOR Take 20 mg by mouth at bedtime. Reported on 08/17/2015        DISCHARGE INSTRUCTIONS:   1. PCP follow-up in 1 week 2. Palliative care services follow-up 3. Use CPAP at bedtime  DIET:   Cardiac diet  ACTIVITY:   Activity as tolerated  OXYGEN:   Home Oxygen: No.  Oxygen Delivery: room air  DISCHARGE LOCATION:   nursing home   If you experience worsening of your admission symptoms, develop shortness of breath, life threatening emergency, suicidal or homicidal thoughts you must seek medical attention immediately by calling 911 or calling your MD immediately  if symptoms less severe.  You Must read complete  instructions/literature along with all the possible adverse reactions/side effects for all the Medicines you take and that have been prescribed to you. Take any new Medicines after you have completely understood and accpet all the possible adverse reactions/side effects.   Please note  You were cared for by a hospitalist during your hospital stay. If you have any questions about your discharge medications or the care you received while you were in the hospital after you are discharged, you can call the unit and asked to speak with the hospitalist on call if the hospitalist that took care of you is not available. Once you are discharged, your primary care physician will handle any further medical issues. Please note that NO REFILLS for any discharge medications will be authorized once you are discharged, as it is imperative that you return to your primary care physician (or establish a relationship with a primary care physician if you do not have one) for your aftercare needs so that they can reassess your need  for medications and monitor your lab values.    On the day of Discharge:  VITAL SIGNS:   Blood pressure (!) 154/73, pulse 79, temperature 97.8 F (36.6 C), temperature source Oral, resp. rate 20, height 5' 6"  (1.676 m), weight (!) 150 kg (330 lb 12.8 oz), SpO2 95 %.  PHYSICAL EXAMINATION:    GENERAL:  83 y.o.-year-old obese patient lying in the bed with no acute distress.  EYES: Pupils equal, round, reactive to light and accommodation. No scleral icterus. Extraocular muscles intact.  HEENT: Head atraumatic, normocephalic. Oropharynx and nasopharynx clear.  NECK:  Supple, no jugular venous distention. No thyroid enlargement, no tenderness.  LUNGS: Normal breath sounds bilaterally, no wheezing, rales,rhonchi or crepitation. No use of accessory muscles of respiration.  CARDIOVASCULAR: S1, S2 normal. No murmurs, rubs, or gallops.  ABDOMEN: Soft, obese abdomen, nontender, nondistended. Bowel  sounds present. No organomegaly or mass.  EXTREMITIES: No pedal edema, cyanosis, or clubbing.  NEUROLOGIC: No new focal neurological deficits, follows some commands. Confused at baseline with visual hallucinations PSYCHIATRIC: The patient is sleeping,easily arousable. Conversational, but not oriented at all SKIN: No obvious rash, lesion, or ulcer.   DATA REVIEW:   CBC  Recent Labs Lab 03/02/16 0356  WBC 5.6  HGB 13.5  HCT 41.4  PLT 168    Chemistries   Recent Labs Lab 02/24/16 1808 02/25/16 0735  03/02/16 0356  NA 139 140  < > 140  K 4.0 2.5*  < > 3.4*  CL 102 104  < > 100*  CO2 26 28  < > 33*  GLUCOSE 166* 86  < > 77  BUN 15 18  < > 14  CREATININE 1.21* 1.26*  < > 0.90  CALCIUM 8.8* 8.2*  < > 8.9  MG  --  1.7  --   --   AST 23  --   --   --   ALT 13*  --   --   --   ALKPHOS 63  --   --   --   BILITOT 1.5*  --   --   --   < > = values in this interval not displayed.   Microbiology Results  Results for orders placed or performed during the hospital encounter of 02/24/16  Urine culture     Status: Abnormal   Collection Time: 02/24/16  7:15 PM  Result Value Ref Range Status   Specimen Description URINE, RANDOM  Final   Special Requests NONE  Final   Culture (A)  Final    >=100,000 COLONIES/mL ESCHERICHIA COLI >=100,000 COLONIES/mL AEROCOCCUS URINAE    Report Status 02/29/2016 FINAL  Final   Organism ID, Bacteria ESCHERICHIA COLI (A)  Final      Susceptibility   Escherichia coli - MIC*    AMPICILLIN >=32 RESISTANT Resistant     CEFAZOLIN <=4 SENSITIVE Sensitive     CEFTRIAXONE <=1 SENSITIVE Sensitive     CIPROFLOXACIN <=0.25 SENSITIVE Sensitive     GENTAMICIN <=1 SENSITIVE Sensitive     IMIPENEM <=0.25 SENSITIVE Sensitive     NITROFURANTOIN <=16 SENSITIVE Sensitive     TRIMETH/SULFA >=320 RESISTANT Resistant     AMPICILLIN/SULBACTAM 16 INTERMEDIATE Intermediate     PIP/TAZO <=4 SENSITIVE Sensitive     Extended ESBL NEGATIVE Sensitive     * >=100,000  COLONIES/mL ESCHERICHIA COLI  C difficile quick scan w PCR reflex     Status: None   Collection Time: 02/24/16 10:59 PM  Result Value Ref Range Status  C Diff antigen NEGATIVE NEGATIVE Final   C Diff toxin NEGATIVE NEGATIVE Final   C Diff interpretation No C. difficile detected.  Final  Gastrointestinal Panel by PCR , Stool     Status: Abnormal   Collection Time: 02/25/16  2:55 PM  Result Value Ref Range Status   Campylobacter species NOT DETECTED NOT DETECTED Final   Plesimonas shigelloides NOT DETECTED NOT DETECTED Final   Salmonella species NOT DETECTED NOT DETECTED Final   Yersinia enterocolitica NOT DETECTED NOT DETECTED Final   Vibrio species NOT DETECTED NOT DETECTED Final   Vibrio cholerae NOT DETECTED NOT DETECTED Final   Enteroaggregative E coli (EAEC) NOT DETECTED NOT DETECTED Final   Enteropathogenic E coli (EPEC) NOT DETECTED NOT DETECTED Final   Enterotoxigenic E coli (ETEC) NOT DETECTED NOT DETECTED Final   Shiga like toxin producing E coli (STEC) NOT DETECTED NOT DETECTED Final   Shigella/Enteroinvasive E coli (EIEC) NOT DETECTED NOT DETECTED Final   Cryptosporidium NOT DETECTED NOT DETECTED Final   Cyclospora cayetanensis NOT DETECTED NOT DETECTED Final   Entamoeba histolytica NOT DETECTED NOT DETECTED Final   Giardia lamblia NOT DETECTED NOT DETECTED Final   Adenovirus F40/41 NOT DETECTED NOT DETECTED Final   Astrovirus NOT DETECTED NOT DETECTED Final   Norovirus GI/GII DETECTED (A) NOT DETECTED Final    Comment: CRITICAL RESULT CALLED TO, READ BACK BY AND VERIFIED WITH: JANE HODGE 02/25/16 1648 SDR    Rotavirus A NOT DETECTED NOT DETECTED Final   Sapovirus (I, II, IV, and V) NOT DETECTED NOT DETECTED Final    RADIOLOGY:  No results found.   Management plans discussed with the patient, family and they are in agreement.  CODE STATUS:     Code Status Orders        Start     Ordered   02/28/16 1152  Do not attempt resuscitation (DNR)  Continuous      Question Answer Comment  In the event of cardiac or respiratory ARREST Do not call a "code blue"   In the event of cardiac or respiratory ARREST Do not perform Intubation, CPR, defibrillation or ACLS   In the event of cardiac or respiratory ARREST Use medication by any route, position, wound care, and other measures to relive pain and suffering. May use oxygen, suction and manual treatment of airway obstruction as needed for comfort.      02/28/16 1151    Code Status History    Date Active Date Inactive Code Status Order ID Comments User Context   02/25/2016 12:05 AM 02/28/2016 11:51 AM Full Code [id number]  Lance Coon, MD Inpatient      TOTAL TIME TAKING CARE OF THIS PATIENT: 38 minutes.    Gladstone Lighter M.D on 03/02/2016 at 8:28 AM  Between 7am to 6pm - Pager - [phone]  After 6pm go to [email or web address] - password [email or web address] [facility]  [facility]  Office  [phone]  CC: Primary care physician; Wilhemena Durie, MD   Note: This dictation was prepared with Dragon dictation along with smaller phrase technology. Any transcriptional errors that result from this process are unintentional.

## 2016-03-02 NOTE — Clinical Social Work Placement (Signed)
   CLINICAL SOCIAL WORK PLACEMENT  NOTE  Date:  03/02/2016  Patient Details  Name: Deborah [name] MRN: [id number] Date of Birth: 24-Jun-1930  Clinical Social Work is seeking post-discharge placement for this patient at the [place] level of care (*CSW will initial, date and re-position this form in  chart as items are completed):  Yes   Patient/family provided with [facility] Work Department's list of facilities offering this level of care within the geographic area requested by the patient (or if unable, by the patient's family).  Yes   Patient/family informed of their freedom to choose among providers that offer the needed level of care, that participate in Medicare, Medicaid or managed care program needed by the patient, have an available bed and are willing to accept the patient.  Yes   Patient/family informed of [facility]'s ownership interest in [facility] and [facility], as well as of the fact that they are under no obligation to receive care at these facilities.  PASRR submitted to EDS on 02/26/16     PASRR number received on      Existing PASRR number confirmed on   02/26/16    FL2 transmitted to all facilities in geographic area requested by pt/family on 02/26/16     FL2 transmitted to all facilities within larger geographic area on       Patient informed that his/her managed care company has contracts with or will negotiate with certain facilities, including the following:        Yes   Patient/family informed of bed offers received.  Patient chooses bed at [facility]     Physician recommends and patient chooses bed at      Patient to be transferred to [facility] on 03/02/16.  Patient to be transferred to facility by [facility]     Patient family notified on 03/02/16 of transfer.  Name of family member notified:  Cartina Brousseau patient's husband who was at bedside.     PHYSICIAN        Additional Comment:    _______________________________________________ Ross Ludwig, LCSWA 03/02/2016, 12:53 PM

## 2016-03-09 ENCOUNTER — Encounter: Payer: Self-pay | Admitting: Family Medicine

## 2016-03-20 ENCOUNTER — Telehealth: Payer: Self-pay | Admitting: Family Medicine

## 2016-03-20 NOTE — Telephone Encounter (Signed)
Please review, is it ok to schedule them to talk to you?-aa

## 2016-03-20 NOTE — Telephone Encounter (Signed)
Have physician at rehabilitation center evaluate patient. As was discussed with Ana.

## 2016-03-20 NOTE — Telephone Encounter (Signed)
Pt son Legrand Como called stating pt is at [facility].  He is requesting a meeting with Dr Rosanna Randy to discuss pt medical back ground.  Pt son states he and his dad have Power of [place].  MV#[phone]/PY

## 2016-03-20 NOTE — Telephone Encounter (Signed)
*  below. 

## 2016-03-20 NOTE — Telephone Encounter (Signed)
Son would like for patient to be placed in skilled level care at [facility].  [facility] states she does not qualify for skilled, only assisted.  Son will need to decide what he would like to do for her care. Have offered to complete FL2 if he decides.  [facility] has also offered to complete FL2 for him. \ED

## 2016-03-27 DIAGNOSIS — F39 Unspecified mood [affective] disorder: Secondary | ICD-10-CM | POA: Diagnosis not present

## 2016-03-27 DIAGNOSIS — E119 Type 2 diabetes mellitus without complications: Secondary | ICD-10-CM | POA: Diagnosis not present

## 2016-03-27 DIAGNOSIS — M839 Adult osteomalacia, unspecified: Secondary | ICD-10-CM | POA: Diagnosis not present

## 2016-03-27 DIAGNOSIS — I10 Essential (primary) hypertension: Secondary | ICD-10-CM | POA: Diagnosis not present

## 2016-03-27 DIAGNOSIS — K5190 Ulcerative colitis, unspecified, without complications: Secondary | ICD-10-CM | POA: Diagnosis not present

## 2016-03-27 DIAGNOSIS — G3183 Dementia with Lewy bodies: Secondary | ICD-10-CM | POA: Diagnosis not present

## 2016-05-03 DIAGNOSIS — F0390 Unspecified dementia without behavioral disturbance: Secondary | ICD-10-CM | POA: Diagnosis not present

## 2016-05-03 DIAGNOSIS — E119 Type 2 diabetes mellitus without complications: Secondary | ICD-10-CM | POA: Diagnosis not present

## 2016-05-03 DIAGNOSIS — K5100 Ulcerative (chronic) pancolitis without complications: Secondary | ICD-10-CM | POA: Diagnosis not present

## 2016-05-03 DIAGNOSIS — F39 Unspecified mood [affective] disorder: Secondary | ICD-10-CM | POA: Diagnosis not present

## 2016-05-03 DIAGNOSIS — R351 Nocturia: Secondary | ICD-10-CM | POA: Diagnosis not present

## 2016-05-03 DIAGNOSIS — I10 Essential (primary) hypertension: Secondary | ICD-10-CM | POA: Diagnosis not present

## 2016-05-18 ENCOUNTER — Telehealth: Payer: Self-pay | Admitting: Family Medicine

## 2016-05-18 NOTE — Telephone Encounter (Addendum)
Nursing at [facility] calls. Dr. Silvio Pate patient.   "AC separation" reported on x-ray after fall this morning. No mention of Type I-VI. I asked nursing specifically about this report. She is still able to move the arm, good pulses, good sensation in hand, good movement and strength in hand. Only reporting mild pain.   Since we do not know the specific type of injury and reported symptoms are mild, agreed to monitor only.   I asked nursing to place patient in sling and monitor. If she has worsening pain or new symptoms should go to emergency room or urgent care. They stated could not get sling until tomorrow morning when physical therapy gets on site.

## 2016-05-19 NOTE — Telephone Encounter (Signed)
Will follow up on Monday---may need ortho appt

## 2016-05-21 DIAGNOSIS — S43102A Unspecified dislocation of left acromioclavicular joint, initial encounter: Secondary | ICD-10-CM | POA: Diagnosis not present

## 2016-05-21 NOTE — Telephone Encounter (Signed)
A-C separation that I suspect is not new. Exam fairly benign today

## 2016-05-21 NOTE — Telephone Encounter (Signed)
PLEASE NOTE: All timestamps contained within this report are represented as [place] Standard Time. CONFIDENTIALTY NOTICE: This fax transmission is intended only for the addressee. It contains information that is legally privileged, confidential or otherwise protected from use or disclosure. If you are not the intended recipient, you are strictly prohibited from reviewing, disclosing, copying using or disseminating any of this information or taking any action in reliance on or regarding this information. If you have received this fax in error, please notify us immediately by telephone so that we can arrange for its return to [place]. Phone: [phone], Toll-Free: [phone], Fax: [phone] Page: 1 of 1 Call Id: [id number] [place] Patient Name: Deborah [name] Gender: Female DOB: 1930-05-06 Age: 39 Y 5 M 2 D Return Phone Number: City/State/Zip: [place] Client [place] Night - Client Client Site [place] Physician Viviana Simpler - MD Who Is Calling Lab Lab Name [facility] [place] [facility] Phone Number [phone] Lab Tech Name [facility] Lab Reference Number Chief Complaint Paging or Request for Consult Call Type Lab Send to RN Reason for Call Report lab results Initial Comment Raford Pitcher with [place] at [phone]. Caller needs to speak with on call regarding x-ray. Additional Comment Nurse Assessment Nurse: Tressia Danas, RN, [place] Date/Time (Eastern Time): 05/18/2016 8:17:47 PM Confirm and document reason for call. If symptomatic, describe symptoms. ---fell earlier today and was xrayed report:complete AC separation this may be acute or remote. no dislocation or fracture Guidelines Guideline Title Affirmed Question Disp. Time Eilene Ghazi Time) Disposition Final User 05/18/2016 8:26:13 PM Clinical Call Yes Tressia Danas, RN, Earnest Bailey Comments User:  Dennard Nip, RN Date/Time Eilene Ghazi Time): 05/18/2016 8:26:38 PM Dr Yong Channel transferred to [place] at [facility]

## 2016-06-22 DIAGNOSIS — G309 Alzheimer's disease, unspecified: Secondary | ICD-10-CM | POA: Diagnosis not present

## 2016-06-22 DIAGNOSIS — R351 Nocturia: Secondary | ICD-10-CM | POA: Diagnosis not present

## 2016-06-22 DIAGNOSIS — K5190 Ulcerative colitis, unspecified, without complications: Secondary | ICD-10-CM | POA: Diagnosis not present

## 2016-06-22 DIAGNOSIS — M25511 Pain in right shoulder: Secondary | ICD-10-CM | POA: Diagnosis not present

## 2016-06-22 DIAGNOSIS — F39 Unspecified mood [affective] disorder: Secondary | ICD-10-CM | POA: Diagnosis not present

## 2016-06-22 DIAGNOSIS — I10 Essential (primary) hypertension: Secondary | ICD-10-CM | POA: Diagnosis not present

## 2016-06-22 DIAGNOSIS — E1140 Type 2 diabetes mellitus with diabetic neuropathy, unspecified: Secondary | ICD-10-CM | POA: Diagnosis not present

## 2016-08-21 ENCOUNTER — Other Ambulatory Visit
Admission: RE | Admit: 2016-08-21 | Discharge: 2016-08-21 | Disposition: A | Discharge status: Home / Self Care | Payer: Medicare Other | Source: Ambulatory Visit | Attending: Gastroenterology | Admitting: Gastroenterology

## 2016-08-21 ENCOUNTER — Ambulatory Visit (INDEPENDENT_AMBULATORY_CARE_PROVIDER_SITE_OTHER): Payer: Medicare Other | Admitting: Gastroenterology

## 2016-08-21 ENCOUNTER — Other Ambulatory Visit: Payer: Self-pay

## 2016-08-21 ENCOUNTER — Encounter: Payer: Self-pay | Admitting: Gastroenterology

## 2016-08-21 VITALS — BP 145/74 | HR 83 | Temp 98.9°F | Ht 67.0 in | Wt 318.0 lb

## 2016-08-21 DIAGNOSIS — R159 Full incontinence of feces: Secondary | ICD-10-CM

## 2016-08-21 DIAGNOSIS — F0390 Unspecified dementia without behavioral disturbance: Secondary | ICD-10-CM | POA: Diagnosis not present

## 2016-08-21 DIAGNOSIS — I10 Essential (primary) hypertension: Secondary | ICD-10-CM | POA: Diagnosis not present

## 2016-08-21 DIAGNOSIS — K5190 Ulcerative colitis, unspecified, without complications: Secondary | ICD-10-CM

## 2016-08-21 DIAGNOSIS — R197 Diarrhea, unspecified: Secondary | ICD-10-CM

## 2016-08-21 DIAGNOSIS — F39 Unspecified mood [affective] disorder: Secondary | ICD-10-CM

## 2016-08-21 DIAGNOSIS — E1142 Type 2 diabetes mellitus with diabetic polyneuropathy: Secondary | ICD-10-CM

## 2016-08-21 NOTE — Progress Notes (Signed)
Jonathon Bellows MD, MRCP(U.K) [address]  [place]  [place], [place] [address]  Main: [phone]  Fax: [phone]   Gastroenterology Consultation  Referring Provider:     [place], [place] Primary Care Physician:  Jerrol Banana., MD Primary Gastroenterologist:  Dr. Jonathon Bellows  Reason for Consultation:    Diarrhea         HPI:   Deborah [name] is a 83 y.o. y/o female referred for consultation & management  by Dr. Rosanna Randy, Retia Passe., MD.    She has been referred for chronic diarrhea.No recent labs available  She says she "cant hold her stools", many months , bit more worse recently. She say she has not see a solid stool in a year atleast . She says she was seen by another doctor for the same a few years back , had a colonoscopy and was told it was normal , I do not have the report with me .   She has 4-5 bowel movements a day , sometimes very sticky, sometimes liquid with small pieces. She says that when she urinates , passes stool at the same time. She had 2 vaginal delivery - first child was prolonged labor, does recall she had a cut and the second time went into labour for 10 mins and had a "tear".   She is scared to go out as she is afraid she will have an accident. She has an accident occasionally. She has had lower back surgery , does not have good sensation in her feet. Can walk with a walker.    Denies any blood in her stool. No change in shape of her stool. No weight loss.   She says she was told she had crohns disease a year back and was put on budesonide which she is taking since a year.  Her last GI note in 2015 suggests that her colon bx showed lymphocytic colitis. Takes 6 mg a day    Past Medical History:  Diagnosis Date  . Anxiety   . Chronic kidney disease, stage III (moderate)   . Contracture of palmar fascia   . Cough   . Diabetes mellitus   . Hyperlipidemia   . Hypertension   . Obesity   . Osteoarthritis   . Other specified acquired  hypothyroidism   . Otitis externa   . Palpitations   . Peripheral neuropathy ([place])   . Pure hypercholesterolemia   . Secondary hyperparathyroidism (of renal origin)   . Sleep apnea   . Unspecified adjustment reaction   . Unspecified hereditary and idiopathic peripheral neuropathy   . Unspecified urinary incontinence     Past Surgical History:  Procedure Laterality Date  . BACK SURGERY  1990   Ruptured Disc.  Marland Kitchen CARDIAC CATHETERIZATION  03/2008  . COLONOSCOPY    . HAND SURGERY Right   . REPLACEMENT TOTAL KNEE Bilateral   . SHOULDER SURGERY Right 06/2007    Prior to Admission medications   Medication Sig Start Date End Date Taking? Authorizing Provider  acetaminophen (TYLENOL) 500 MG tablet Take 500 mg by mouth every 6 (six) hours as needed for mild pain.     [provider]  amLODipine (NORVASC) 2.5 MG tablet Take 1 tablet (2.5 mg total) by mouth 2 (two) times daily. 03/02/16   Gladstone Lighter, MD  aspirin EC 81 MG tablet Take 81 mg by mouth at bedtime.    [provider]  budesonide (ENTOCORT EC) 3 MG 24 hr capsule  Take 2 capsules (6 mg total) by mouth daily. Patient taking differently: Take 3 mg by mouth 2 (two) times daily.  07/05/15   Margarita Rana, MD  escitalopram (LEXAPRO) 20 MG tablet Take 1 tablet (20 mg total) by mouth daily. 12/17/14   Margarita Rana, MD  feeding supplement, ENSURE ENLIVE, (ENSURE ENLIVE) LIQD Take 237 mLs by mouth 2 (two) times daily between meals. 03/02/16   Gladstone Lighter, MD  haloperidol (HALDOL) 2 MG tablet Take 1 tablet (2 mg total) by mouth every 8 (eight) hours as needed for agitation. 03/02/16   Gladstone Lighter, MD  imipramine (TOFRANIL) 25 MG tablet Take 1 tablet (25 mg total) by mouth at bedtime. 03/02/16   Gladstone Lighter, MD  mirtazapine (REMERON) 7.5 MG tablet Take 1 tablet (7.5 mg total) by mouth at bedtime. 03/02/16   Gladstone Lighter, MD  montelukast (SINGULAIR) 10 MG tablet Take 1 tablet (10 mg total) by mouth  daily. 03/02/16   Gladstone Lighter, MD  QUEtiapine (SEROQUEL) 25 MG tablet Take 1 tablet (25 mg total) by mouth at bedtime. 03/02/16   Gladstone Lighter, MD  simvastatin (ZOCOR) 20 MG tablet Take 20 mg by mouth at bedtime. Reported on 08/17/2015 07/18/15   Margarita Rana, MD    Family History  Problem Relation Age of Onset  . Heart attack Father   . Hypertension Sister   . Hypertension Sister   . Alzheimer's disease Mother      Social History  Substance Use Topics  . Smoking status: Former Smoker    Packs/day: 10.00    Years: 1.00    Types: Cigarettes    Quit date: 12/27/1997  . Smokeless tobacco: Never Used  . Alcohol use No    Allergies as of 08/21/2016 - Review Complete 02/24/2016  Allergen Reaction Noted  . Ace inhibitors Cough 12/28/2010  . Ciprofloxacin hcl Other (See Comments) 05/31/2015  . Quinolones Other (See Comments) 03/17/2015    Review of Systems:    All systems reviewed and negative except where noted in HPI.   Physical Exam:  There were no vitals taken for this visit. No LMP recorded. Patient is postmenopausal. Psych:  Alert and cooperative. Normal mood and affect. General:   Alert,  Well-developed, well-nourished, obese in a wheel chair , pleasant and cooperative in NAD Head:  Normocephalic and atraumatic. Eyes:  Sclera clear, no icterus.   Conjunctiva pink. Ears:  Normal auditory acuity. Nose:  No deformity, discharge, or lesions. Mouth:  No deformity or lesions,oropharynx pink & moist. Neck:  Supple; no masses or thyromegaly. Lungs:  Respirations even and unlabored.  Clear throughout to auscultation.   No wheezes, crackles, or rhonchi. No acute distress. Heart:  Regular rate and rhythm; no murmurs, clicks, rubs, or gallops. Abdomen:  Normal bowel sounds.  No bruits.  Soft, non-tender and non-distended without masses, hepatosplenomegaly or hernias noted.  No guarding or rebound tenderness.    Extremities:  No clubbing orNo cyanosis.Bruised skin over arms   Neurologic:  Alert and oriented x3;  grossly normal neurologically. Psych:  Alert and cooperative. Normal mood and affect.  Imaging Studies: No results found.  Assessment and Plan:   Deborah [name] is a 83 y.o. y/o female has been referred for diarrhea,incontinence . It appears she was diagnosed with lymphocytic colitis back in 2015 , was on entocort was doing well, presently on 6 mg a day and still having symptoms. She does have a history suggetsive of possible incontinence due to nerve damage from back issues +/-  prolonged child birth and possible decreased anal tone.   Plan  1. Increase entocort to 3 tablets a day from 2 tablets for lymphocytic colitis 2. Olf colonoscopy reports and biopsies will be obtained  3. Check stool for C diff and PCR 4. If no better in 2-3 weeks will proceed with colonoscopy to determine status of colitis    Follow up in 2-3 weeks.   Dr Jonathon Bellows MD,MRCP(U.K)

## 2016-08-22 ENCOUNTER — Other Ambulatory Visit: Payer: Self-pay

## 2016-08-22 MED ORDER — BUDESONIDE 3 MG PO CPEP: 9.0000 mg | ORAL_CAPSULE | Freq: Every day | ORAL | 0 refills | Status: DC

## 2016-08-22 NOTE — Telephone Encounter (Signed)
[facility] called concerning changed Rx for patient.  Faxed a copy of the pharmacy order to nurse @ [phone]

## 2016-08-24 DIAGNOSIS — J209 Acute bronchitis, unspecified: Secondary | ICD-10-CM | POA: Diagnosis not present

## 2016-08-27 ENCOUNTER — Other Ambulatory Visit
Admission: RE | Admit: 2016-08-27 | Discharge: 2016-08-27 | Disposition: A | Discharge status: Home / Self Care | Payer: Medicare Other | Source: Ambulatory Visit | Attending: Gastroenterology | Admitting: Gastroenterology

## 2016-08-27 DIAGNOSIS — R197 Diarrhea, unspecified: Secondary | ICD-10-CM | POA: Insufficient documentation

## 2016-08-27 LAB — GASTROINTESTINAL PANEL BY PCR, STOOL (REPLACES STOOL CULTURE)

## 2016-08-27 LAB — C DIFFICILE QUICK SCREEN W PCR REFLEX
C DIFFICILE (CDIFF) TOXIN: NEGATIVE
C DIFFICLE (CDIFF) ANTIGEN: NEGATIVE
C Diff interpretation: NOT DETECTED

## 2016-08-28 ENCOUNTER — Telehealth: Payer: Self-pay

## 2016-08-28 DIAGNOSIS — J45901 Unspecified asthma with (acute) exacerbation: Secondary | ICD-10-CM

## 2016-08-28 NOTE — Telephone Encounter (Signed)
-----   Message from Jonathon Bellows, MD sent at 08/27/2016  2:20 PM EDT ----- No cdiff

## 2016-08-28 NOTE — Telephone Encounter (Signed)
LVM for patient callback for results per Dr. [name].  GI PCR negative No cdiff

## 2016-08-31 DIAGNOSIS — J209 Acute bronchitis, unspecified: Secondary | ICD-10-CM | POA: Diagnosis not present

## 2016-09-04 ENCOUNTER — Encounter: Payer: Self-pay | Admitting: Gastroenterology

## 2016-09-04 ENCOUNTER — Ambulatory Visit (INDEPENDENT_AMBULATORY_CARE_PROVIDER_SITE_OTHER): Payer: Medicare Other | Admitting: Gastroenterology

## 2016-09-04 ENCOUNTER — Telehealth: Payer: Self-pay

## 2016-09-04 VITALS — BP 130/73 | HR 91 | Temp 98.9°F | Ht 67.0 in | Wt 313.0 lb

## 2016-09-04 DIAGNOSIS — R197 Diarrhea, unspecified: Secondary | ICD-10-CM | POA: Diagnosis not present

## 2016-09-04 NOTE — Telephone Encounter (Signed)
Gastroenterology Pre-Procedure Review  Request Date: 8/1 Requesting Physician: Dr. [name]  PATIENT REVIEW QUESTIONS: The patient responded to the following health history questions as indicated:    1. Are you having any GI issues? yes (diarrhea) 2. Do you have a personal history of Polyps? yes (removed) 3. Do you have a family history of Colon Cancer or Polyps? no 4. Diabetes Mellitus? no 5. Joint replacements in the past 12 months?no 6. Major health problems in the past 3 months?no 7. Any artificial heart valves, MVP, or defibrillator?no    MEDICATIONS & ALLERGIES:    Patient reports the following regarding taking any anticoagulation/antiplatelet therapy:   Plavix, Coumadin, Eliquis, Xarelto, Lovenox, Pradaxa, Brilinta, or Effient? no Aspirin? yes (81 mg)  Patient confirms/reports the following medications:  Current Outpatient Prescriptions  Medication Sig Dispense Refill  . acetaminophen (TYLENOL) 500 MG tablet Take 500 mg by mouth every 6 (six) hours as needed for mild pain.     Marland Kitchen amLODipine (NORVASC) 2.5 MG tablet Take 1 tablet (2.5 mg total) by mouth 2 (two) times daily. 30 tablet 0  . aspirin EC 81 MG tablet Take 81 mg by mouth at bedtime.    . budesonide (ENTOCORT EC) 3 MG 24 hr capsule Take 3 capsules (9 mg total) by mouth daily. 270 capsule 0  . cholestyramine (QUESTRAN) 4 GM/DOSE powder Take by mouth 3 (three) times daily with meals.    Marland Kitchen escitalopram (LEXAPRO) 20 MG tablet Take 1 tablet (20 mg total) by mouth daily. 30 tablet 5  . feeding supplement, ENSURE ENLIVE, (ENSURE ENLIVE) LIQD Take 237 mLs by mouth 2 (two) times daily between meals. 237 mL 12  . haloperidol (HALDOL) 2 MG tablet Take 1 tablet (2 mg total) by mouth every 8 (eight) hours as needed for agitation. 25 tablet 0  . imipramine (TOFRANIL) 25 MG tablet Take 1 tablet (25 mg total) by mouth at bedtime. 30 tablet 2  . mirtazapine (REMERON) 7.5 MG tablet Take 1 tablet (7.5 mg total) by mouth at bedtime. 30 tablet 2   . montelukast (SINGULAIR) 10 MG tablet Take 1 tablet (10 mg total) by mouth daily. 30 tablet 0  . QUEtiapine (SEROQUEL) 25 MG tablet Take 1 tablet (25 mg total) by mouth at bedtime. 30 tablet 2  . simvastatin (ZOCOR) 20 MG tablet Take 20 mg by mouth at bedtime. Reported on 08/17/2015 1 tablet 1   No current facility-administered medications for this visit.     Patient confirms/reports the following allergies:  Allergies  Allergen Reactions  . Ace Inhibitors Cough  . Ciprofloxacin Hcl Other (See Comments)    Reaction: unknown  . Quinolones Other (See Comments)    Reaction: Tendon rupture    No orders of the defined types were placed in this encounter.   AUTHORIZATION INFORMATION Primary Insurance: 1D#: Group #:  Secondary Insurance: 1D#: Group #:  SCHEDULE INFORMATION: Date: 8/1 Time: Location: [facility]

## 2016-09-04 NOTE — Addendum Note (Signed)
Addended by: Peggye Ley on: 09/04/2016 03:03 PM   Modules accepted: Orders, SmartSet

## 2016-09-04 NOTE — Progress Notes (Signed)
Jonathon Bellows MD, MRCP(U.K) [address]  [place]  [place], [place] [address]  Main: [phone]  Fax: [phone]   Primary Care Physician: Venia Carbon, MD  Primary Gastroenterologist:  Dr. Jonathon Bellows   Chief Complaint  Patient presents with  . Diarrhea    HPI: Deborah [name] is a 83 y.o. female    Summary of history : She says she "cant hold her stools", many months , bit more worse recently. She say she has not seen a solid stool in a year atleast . She has 4-5 bowel movements a day , sometimes very sticky, sometimes liquid with small pieces. She says that when she urinates , passes stool at the same time. She had 2 vaginal delivery - first child was prolonged labor, does recall she had a cut and the second time went into labour for 10 mins and had a "tear". She is scared to go out as she is afraid she will have an accident. She has an accident occasionally. She has had lower back surgery , does not have good sensation in her feet. Can walk with a walker.  Her last GI note in 2015 suggests that her colon bx showed lymphocytic colitis. Takes 6 mg a day    Interval history   08/21/2016-  09/04/2016   Stool negative for c diff  , GI PCR-negative.  Obtained last colonoscopy report from 2015 and random colon biopsies confirm she had lymphocytic colitis   Presently she has a bowel movement every time she eats , every time she passes urine, 5-6 times a day , ranges form watery and sometimes its sticky. She has been on 9 mg budesonide and has not noticed any changes. Not on any artificial sweetners in her diet .  Current Outpatient Prescriptions  Medication Sig Dispense Refill  . acetaminophen (TYLENOL) 500 MG tablet Take 500 mg by mouth every 6 (six) hours as needed for mild pain.     Marland Kitchen amLODipine (NORVASC) 2.5 MG tablet Take 1 tablet (2.5 mg total) by mouth 2 (two) times daily. 30 tablet 0  . aspirin EC 81 MG tablet Take 81 mg by mouth at bedtime.    . budesonide  (ENTOCORT EC) 3 MG 24 hr capsule Take 3 capsules (9 mg total) by mouth daily. 270 capsule 0  . cholestyramine (QUESTRAN) 4 GM/DOSE powder Take by mouth 3 (three) times daily with meals.    Marland Kitchen escitalopram (LEXAPRO) 20 MG tablet Take 1 tablet (20 mg total) by mouth daily. 30 tablet 5  . feeding supplement, ENSURE ENLIVE, (ENSURE ENLIVE) LIQD Take 237 mLs by mouth 2 (two) times daily between meals. 237 mL 12  . haloperidol (HALDOL) 2 MG tablet Take 1 tablet (2 mg total) by mouth every 8 (eight) hours as needed for agitation. 25 tablet 0  . imipramine (TOFRANIL) 25 MG tablet Take 1 tablet (25 mg total) by mouth at bedtime. 30 tablet 2  . mirtazapine (REMERON) 7.5 MG tablet Take 1 tablet (7.5 mg total) by mouth at bedtime. 30 tablet 2  . montelukast (SINGULAIR) 10 MG tablet Take 1 tablet (10 mg total) by mouth daily. 30 tablet 0  . QUEtiapine (SEROQUEL) 25 MG tablet Take 1 tablet (25 mg total) by mouth at bedtime. 30 tablet 2  . simvastatin (ZOCOR) 20 MG tablet Take 20 mg by mouth at bedtime. Reported on 08/17/2015 1 tablet 1   No current facility-administered medications for this visit.     Allergies as of  09/04/2016 - Review Complete 09/04/2016  Allergen Reaction Noted  . Ace inhibitors Cough 12/28/2010  . Ciprofloxacin hcl Other (See Comments) 05/31/2015  . Quinolones Other (See Comments) 03/17/2015    ROS:  General: Negative for anorexia, weight loss, fever, chills, fatigue, weakness. ENT: Negative for hoarseness, difficulty swallowing , nasal congestion. CV: Negative for chest pain, angina, palpitations, dyspnea on exertion, peripheral edema.  Respiratory: Negative for dyspnea at rest, dyspnea on exertion, cough, sputum, wheezing.  GI: See history of present illness. GU:  Negative for dysuria, hematuria, urinary incontinence, urinary frequency, nocturnal urination.  Endo: Negative for unusual weight change.    Physical Examination:   BP 130/73   Pulse 91   Temp 98.9 F (37.2 C)  (Oral)   Ht 5' 7"  (1.702 m)   Wt (!) 313 lb (142 kg)   BMI 49.02 kg/m   General: Well-nourished, well-developed in no acute distress. In a  Wheel chair Eyes: No icterus. Conjunctivae pink. Mouth: Oropharyngeal mucosa moist and pink , no lesions erythema or exudate. Lungs: Clear to auscultation bilaterally. Non-labored. Heart: Regular rate and rhythm, no murmurs rubs or gallops.  Abdomen: Bowel sounds are normal, nontender, nondistended, no hepatosplenomegaly or masses, no abdominal bruits or hernia , no rebound or guarding.   Extremities: No lower extremity edema. No clubbing or deformities. Neuro: Alert and oriented x 3.  Grossly intact. Skin: Warm and dry, no jaundice.  Bruises over both arms  Psych: Alert and cooperative, normal mood and affect.   Imaging Studies: No results found.  Assessment and Plan:   Deborah [name] is a 83 y.o. y/o female  has been referred for diarrhea,incontinence . She was diagnosed with lymphocytic colitis back in 2015 , was on entocort was doing well, I increased it to 9 mg last visit and still having symptoms. She does have a history suggetsive of possible incontinence due to nerve damage from back issues +/- prolonged child birth and possible decreased anal tone.   Plan  1. Stop entocort since it is not working  2. Diagnostic colonoscopy with random colon biopsies 3.  PRN imodium to harden the stool 2 mg prior to each meal maximum of 12 mg a day  4. Copy of this office note provided to patient  I have discussed alternative options, risks & benefits,  which include, but are not limited to, bleeding, infection, perforation,respiratory complication & drug reaction.  The patient agrees with this plan & written consent will be obtained.     Dr Jonathon Bellows  MD,MRCP [facility]) Follow up in 8 weeks

## 2016-09-05 ENCOUNTER — Telehealth: Payer: Self-pay | Admitting: Gastroenterology

## 2016-09-05 NOTE — Telephone Encounter (Signed)
10/06/16 MCR  NO prior auth required for Colonoscopy 45378 / R19.7 [name], [place], 09/12/16)

## 2016-09-06 ENCOUNTER — Encounter: Payer: Self-pay | Admitting: Anesthesiology

## 2016-09-07 ENCOUNTER — Other Ambulatory Visit: Payer: Self-pay

## 2016-09-07 DIAGNOSIS — R197 Diarrhea, unspecified: Secondary | ICD-10-CM

## 2016-09-12 ENCOUNTER — Ambulatory Visit: Admission: RE | Admit: 2016-09-12 | Payer: Medicare Other | Source: Ambulatory Visit | Admitting: Gastroenterology

## 2016-09-12 SURGERY — COLONOSCOPY WITH PROPOFOL
Anesthesia: Choice

## 2016-10-02 ENCOUNTER — Ambulatory Visit: Payer: Medicare Other | Admitting: Anesthesiology

## 2016-10-02 ENCOUNTER — Encounter: Payer: Self-pay | Admitting: *Deleted

## 2016-10-02 ENCOUNTER — Ambulatory Visit
Admission: RE | Admit: 2016-10-02 | Discharge: 2016-10-02 | Disposition: A | Discharge status: Home / Self Care | Payer: Medicare Other | Source: Ambulatory Visit | Attending: Gastroenterology | Admitting: Gastroenterology

## 2016-10-02 ENCOUNTER — Encounter
Admission: RE | Disposition: A | Discharge status: Home / Self Care | Payer: Self-pay | Source: Ambulatory Visit | Attending: Gastroenterology

## 2016-10-02 DIAGNOSIS — D123 Benign neoplasm of transverse colon: Secondary | ICD-10-CM | POA: Insufficient documentation

## 2016-10-02 DIAGNOSIS — K635 Polyp of colon: Secondary | ICD-10-CM | POA: Insufficient documentation

## 2016-10-02 DIAGNOSIS — Z791 Long term (current) use of non-steroidal anti-inflammatories (NSAID): Secondary | ICD-10-CM | POA: Diagnosis not present

## 2016-10-02 DIAGNOSIS — Z888 Allergy status to other drugs, medicaments and biological substances status: Secondary | ICD-10-CM | POA: Diagnosis not present

## 2016-10-02 DIAGNOSIS — D120 Benign neoplasm of cecum: Secondary | ICD-10-CM

## 2016-10-02 DIAGNOSIS — Z7982 Long term (current) use of aspirin: Secondary | ICD-10-CM | POA: Diagnosis not present

## 2016-10-02 DIAGNOSIS — R197 Diarrhea, unspecified: Secondary | ICD-10-CM

## 2016-10-02 DIAGNOSIS — Z79899 Other long term (current) drug therapy: Secondary | ICD-10-CM | POA: Insufficient documentation

## 2016-10-02 DIAGNOSIS — K529 Noninfective gastroenteritis and colitis, unspecified: Secondary | ICD-10-CM | POA: Diagnosis not present

## 2016-10-02 HISTORY — PX: COLONOSCOPY WITH PROPOFOL: SHX5780

## 2016-10-02 SURGERY — COLONOSCOPY WITH PROPOFOL
Anesthesia: General

## 2016-10-02 MED ORDER — PROPOFOL 10 MG/ML IV BOLUS
INTRAVENOUS | Status: AC
  Filled 2016-10-02: qty 20

## 2016-10-02 MED ORDER — PROPOFOL 500 MG/50ML IV EMUL
INTRAVENOUS | Status: DC | PRN
  Administered 2016-10-02: 120 ug/kg/min via INTRAVENOUS

## 2016-10-02 MED ORDER — LIDOCAINE HCL (PF) 2 % IJ SOLN
INTRAMUSCULAR | Status: AC
  Filled 2016-10-02: qty 2

## 2016-10-02 MED ORDER — LIDOCAINE 2% (20 MG/ML) 5 ML SYRINGE
INTRAMUSCULAR | Status: DC | PRN
  Administered 2016-10-02: 60 mg via INTRAVENOUS

## 2016-10-02 MED ORDER — PROPOFOL 10 MG/ML IV BOLUS
INTRAVENOUS | Status: DC | PRN
  Administered 2016-10-02: 50 mg via INTRAVENOUS

## 2016-10-02 MED ORDER — SODIUM CHLORIDE 0.9 % IV SOLN
INTRAVENOUS | Status: DC
  Administered 2016-10-02: 1000 mL via INTRAVENOUS

## 2016-10-02 NOTE — Anesthesia Preprocedure Evaluation (Signed)
Anesthesia Evaluation  Patient identified by MRN, date of birth, ID band Patient awake    Reviewed: Allergy & Precautions, NPO status , Patient's Chart, lab work & pertinent test results  History of Anesthesia Complications Negative for: history of anesthetic complications  Airway Mallampati: III       Dental  (+) Upper Dentures, Lower Dentures   Pulmonary sleep apnea , COPD,  COPD inhaler, former smoker,           Cardiovascular hypertension, Pt. on medications      Neuro/Psych Anxiety Depression    GI/Hepatic Neg liver ROS, neg GERD  ,  Endo/Other  diabetesHypothyroidism   Renal/GU Renal InsufficiencyRenal disease     Musculoskeletal   Abdominal   Peds  Hematology   Anesthesia Other Findings   Reproductive/Obstetrics                            Anesthesia Physical Anesthesia Plan  ASA: III  Anesthesia Plan: General   Post-op Pain Management:    Induction: Intravenous  PONV Risk Score and Plan: Propofol infusion  Airway Management Planned:   Additional Equipment:   Intra-op Plan:   Post-operative Plan:   Informed Consent: I have reviewed the patients History and Physical, chart, labs and discussed the procedure including the risks, benefits and alternatives for the proposed anesthesia with the patient or authorized representative who has indicated his/her understanding and acceptance.     Plan Discussed with:   Anesthesia Plan Comments:         Anesthesia Quick Evaluation

## 2016-10-02 NOTE — Anesthesia Postprocedure Evaluation (Signed)
Anesthesia Post Note  Patient: Deborah [name]  Procedure(s) Performed: Procedure(s) (LRB): COLONOSCOPY WITH PROPOFOL (N/A)  Patient location during evaluation: Endoscopy Anesthesia Type: General Level of consciousness: awake and alert Pain management: pain level controlled Vital Signs Assessment: post-procedure vital signs reviewed and stable Respiratory status: spontaneous breathing and respiratory function stable Cardiovascular status: stable Anesthetic complications: no     Last Vitals:  Vitals:   10/02/16 0950 10/02/16 1133  BP: (!) 152/68 (!) 117/55  Pulse: 85 76  Resp: 20 17  Temp: (!) 36.3 C (!) 36 C  SpO2: 96% 97%    Last Pain:  Vitals:   10/02/16 1133  TempSrc: Tympanic                 KEPHART,WILLIAM K

## 2016-10-02 NOTE — H&P (View-Only) (Signed)
Jonathon Bellows MD, MRCP(U.K) [address]  [place]  [place], [place] [address]  Main: [phone]  Fax: [phone]   Primary Care Physician: Venia Carbon, MD  Primary Gastroenterologist:  Dr. Jonathon Bellows   Chief Complaint  Patient presents with  . Diarrhea    HPI: Deborah [name] is a 83 y.o. female    Summary of history : She says she "cant hold her stools", many months , bit more worse recently. She say she has not seen a solid stool in a year atleast . She has 4-5 bowel movements a day , sometimes very sticky, sometimes liquid with small pieces. She says that when she urinates , passes stool at the same time. She had 2 vaginal delivery - first child was prolonged labor, does recall she had a cut and the second time went into labour for 10 mins and had a "tear". She is scared to go out as she is afraid she will have an accident. She has an accident occasionally. She has had lower back surgery , does not have good sensation in her feet. Can walk with a walker.  Her last GI note in 2015 suggests that her colon bx showed lymphocytic colitis. Takes 6 mg a day    Interval history   08/21/2016-  09/04/2016   Stool negative for c diff  , GI PCR-negative.  Obtained last colonoscopy report from 2015 and random colon biopsies confirm she had lymphocytic colitis   Presently she has a bowel movement every time she eats , every time she passes urine, 5-6 times a day , ranges form watery and sometimes its sticky. She has been on 9 mg budesonide and has not noticed any changes. Not on any artificial sweetners in her diet .  Current Outpatient Prescriptions  Medication Sig Dispense Refill  . acetaminophen (TYLENOL) 500 MG tablet Take 500 mg by mouth every 6 (six) hours as needed for mild pain.     Marland Kitchen amLODipine (NORVASC) 2.5 MG tablet Take 1 tablet (2.5 mg total) by mouth 2 (two) times daily. 30 tablet 0  . aspirin EC 81 MG tablet Take 81 mg by mouth at bedtime.    . budesonide  (ENTOCORT EC) 3 MG 24 hr capsule Take 3 capsules (9 mg total) by mouth daily. 270 capsule 0  . cholestyramine (QUESTRAN) 4 GM/DOSE powder Take by mouth 3 (three) times daily with meals.    Marland Kitchen escitalopram (LEXAPRO) 20 MG tablet Take 1 tablet (20 mg total) by mouth daily. 30 tablet 5  . feeding supplement, ENSURE ENLIVE, (ENSURE ENLIVE) LIQD Take 237 mLs by mouth 2 (two) times daily between meals. 237 mL 12  . haloperidol (HALDOL) 2 MG tablet Take 1 tablet (2 mg total) by mouth every 8 (eight) hours as needed for agitation. 25 tablet 0  . imipramine (TOFRANIL) 25 MG tablet Take 1 tablet (25 mg total) by mouth at bedtime. 30 tablet 2  . mirtazapine (REMERON) 7.5 MG tablet Take 1 tablet (7.5 mg total) by mouth at bedtime. 30 tablet 2  . montelukast (SINGULAIR) 10 MG tablet Take 1 tablet (10 mg total) by mouth daily. 30 tablet 0  . QUEtiapine (SEROQUEL) 25 MG tablet Take 1 tablet (25 mg total) by mouth at bedtime. 30 tablet 2  . simvastatin (ZOCOR) 20 MG tablet Take 20 mg by mouth at bedtime. Reported on 08/17/2015 1 tablet 1   No current facility-administered medications for this visit.     Allergies as of  09/04/2016 - Review Complete 09/04/2016  Allergen Reaction Noted  . Ace inhibitors Cough 12/28/2010  . Ciprofloxacin hcl Other (See Comments) 05/31/2015  . Quinolones Other (See Comments) 03/17/2015    ROS:  General: Negative for anorexia, weight loss, fever, chills, fatigue, weakness. ENT: Negative for hoarseness, difficulty swallowing , nasal congestion. CV: Negative for chest pain, angina, palpitations, dyspnea on exertion, peripheral edema.  Respiratory: Negative for dyspnea at rest, dyspnea on exertion, cough, sputum, wheezing.  GI: See history of present illness. GU:  Negative for dysuria, hematuria, urinary incontinence, urinary frequency, nocturnal urination.  Endo: Negative for unusual weight change.    Physical Examination:   BP 130/73   Pulse 91   Temp 98.9 F (37.2 C)  (Oral)   Ht 5' 7"  (1.702 m)   Wt (!) 313 lb (142 kg)   BMI 49.02 kg/m   General: Well-nourished, well-developed in no acute distress. In a  Wheel chair Eyes: No icterus. Conjunctivae pink. Mouth: Oropharyngeal mucosa moist and pink , no lesions erythema or exudate. Lungs: Clear to auscultation bilaterally. Non-labored. Heart: Regular rate and rhythm, no murmurs rubs or gallops.  Abdomen: Bowel sounds are normal, nontender, nondistended, no hepatosplenomegaly or masses, no abdominal bruits or hernia , no rebound or guarding.   Extremities: No lower extremity edema. No clubbing or deformities. Neuro: Alert and oriented x 3.  Grossly intact. Skin: Warm and dry, no jaundice.  Bruises over both arms  Psych: Alert and cooperative, normal mood and affect.   Imaging Studies: No results found.  Assessment and Plan:   Deborah [name] is a 83 y.o. y/o female  has been referred for diarrhea,incontinence . She was diagnosed with lymphocytic colitis back in 2015 , was on entocort was doing well, I increased it to 9 mg last visit and still having symptoms. She does have a history suggetsive of possible incontinence due to nerve damage from back issues +/- prolonged child birth and possible decreased anal tone.   Plan  1. Stop entocort since it is not working  2. Diagnostic colonoscopy with random colon biopsies 3.  PRN imodium to harden the stool 2 mg prior to each meal maximum of 12 mg a day  4. Copy of this office note provided to patient  I have discussed alternative options, risks & benefits,  which include, but are not limited to, bleeding, infection, perforation,respiratory complication & drug reaction.  The patient agrees with this plan & written consent will be obtained.     Dr Jonathon Bellows  MD,MRCP [facility]) Follow up in 8 weeks

## 2016-10-02 NOTE — Anesthesia Post-op Follow-up Note (Signed)
Anesthesia QCDR form completed.        

## 2016-10-02 NOTE — Interval H&P Note (Signed)
History and Physical Interval Note:  10/02/2016 10:59 AM  Deborah [name]  has presented today for surgery, with the diagnosis of Diarrhea  The various methods of treatment have been discussed with the patient and family. After consideration of risks, benefits and other options for treatment, the patient has consented to  Procedure(s): COLONOSCOPY WITH PROPOFOL (N/A) as a surgical intervention .  The patient's history has been reviewed, patient examined, no change in status, stable for surgery.  I have reviewed the patient's chart and labs.  Questions were answered to the patient's satisfaction.     [name]

## 2016-10-02 NOTE — Op Note (Signed)
[facility] Gastroenterology Patient Name: Deborah [name] Procedure Date: 10/02/2016 11:04 AM MRN: [id number] Account #: [id number] Date of Birth: Jan 13, 1931 Admit Type: Outpatient Age: 83 Room: [facility] ENDO ROOM 4 Gender: Female Note Status: Finalized Procedure:            Colonoscopy Indications:          Chronic diarrhea Providers:            Lucilla Lame MD, MD Referring MD:         Venia Carbon (Referring MD) Medicines:            Propofol per Anesthesia Complications:        No immediate complications. Procedure:            Pre-Anesthesia Assessment:                       - Prior to the procedure, a History and Physical was                        performed, and patient medications and allergies were                        reviewed. The patient's tolerance of previous                        anesthesia was also reviewed. The risks and benefits of                        the procedure and the sedation options and risks were                        discussed with the patient. All questions were                        answered, and informed consent was obtained. Prior                        Anticoagulants: The patient has taken no previous                        anticoagulant or antiplatelet agents. ASA Grade                        Assessment: II - A patient with mild systemic disease.                        After reviewing the risks and benefits, the patient was                        deemed in satisfactory condition to undergo the                        procedure.                       After obtaining informed consent, the colonoscope was                        passed under direct vision. Throughout the procedure,  the patient's blood pressure, pulse, and oxygen                        saturations were monitored continuously. The                        Colonoscope was introduced through the anus and                        advanced to the the  cecum, identified by appendiceal                        orifice and ileocecal valve. The colonoscopy was                        performed without difficulty. The patient tolerated the                        procedure well. The quality of the bowel preparation                        was good. Findings:      The perianal and digital rectal examinations were normal.      A 9 mm polyp was found in the cecum. The polyp was sessile. The polyp       was removed with a hot snare. Resection and retrieval were complete. For       hemostasis, four hemostatic clips were successfully placed (MR       conditional). There was no bleeding at the end of the procedure.      A 4 mm polyp was found in the transverse colon. The polyp was sessile.       The polyp was removed with a cold biopsy forceps. Resection and       retrieval were complete.      The exam was otherwise without abnormality.      Random biopsies were obtained with cold forceps for histology. Impression:           - One 9 mm polyp in the cecum, removed with a hot                        snare. Resected and retrieved. Clips (MR conditional)                        were placed.                       - One 4 mm polyp in the transverse colon, removed with                        a cold biopsy forceps. Resected and retrieved.                       - The examination was otherwise normal.                       - Random biopsies were obtained. Recommendation:       - Discharge patient to a nursing home.                       - Resume  previous diet.                       - Continue present medications. Procedure Code(s):    --- Professional ---                       [phone], Colonoscopy, flexible; with removal of tumor(s),                        polyp(s), or other lesion(s) by snare technique                       45380, 54, Colonoscopy, flexible; with biopsy, single                        or multiple Diagnosis Code(s):    --- Professional ---                        K52.9, Noninfective gastroenteritis and colitis,                        unspecified                       D12.3, Benign neoplasm of transverse colon (hepatic                        flexure or splenic flexure)                       D12.0, Benign neoplasm of cecum CPT copyright 2016 American Medical Association. All rights reserved. The codes documented in this report are preliminary and upon coder review may  be revised to meet current compliance requirements. Lucilla Lame MD, MD 10/02/2016 11:29:40 AM This report has been signed electronically. Number of Addenda: 0 Note Initiated On: 10/02/2016 11:04 AM Scope Withdrawal Time: 0 hours 12 minutes 42 seconds  Total Procedure Duration: 0 hours 16 minutes 29 seconds       [employer]

## 2016-10-02 NOTE — Transfer of Care (Signed)
Immediate Anesthesia Transfer of Care Note  Patient: Deborah [name] [facility]  Procedure(s) Performed: Procedure(s): COLONOSCOPY WITH PROPOFOL (N/A)  Patient Location: PACU  Anesthesia Type:General  Level of Consciousness: awake  Airway & Oxygen Therapy: Patient connected to nasal cannula oxygen  Post-op Assessment: Post -op Vital signs reviewed and stable  Post vital signs: stable  Last Vitals:  Vitals:   10/02/16 0950 10/02/16 1133  BP: (!) 152/68 (!) 117/55  Pulse: 85 76  Resp: 20 17  Temp: (!) 36.3 C (!) 36 C  SpO2: 96% 97%    Last Pain:  Vitals:   10/02/16 1133  TempSrc: Tympanic         Complications: No apparent anesthesia complications

## 2016-10-04 ENCOUNTER — Encounter: Payer: Self-pay | Admitting: Gastroenterology

## 2016-10-04 DIAGNOSIS — G9009 Other idiopathic peripheral autonomic neuropathy: Secondary | ICD-10-CM

## 2016-10-04 DIAGNOSIS — R05 Cough: Secondary | ICD-10-CM | POA: Diagnosis not present

## 2016-10-04 LAB — SURGICAL PATHOLOGY

## 2016-10-11 ENCOUNTER — Telehealth: Payer: Self-pay

## 2016-10-11 NOTE — Telephone Encounter (Signed)
LVM for pt to return my call to schedule a follow up appt.

## 2016-10-11 NOTE — Telephone Encounter (Signed)
-----   Message from Lucilla Lame, MD sent at 10/09/2016  6:34 PM EDT ----- Please have the patient come in for a follow up with Dr. [name] or myself.

## 2016-10-16 ENCOUNTER — Encounter (INDEPENDENT_AMBULATORY_CARE_PROVIDER_SITE_OTHER): Payer: Self-pay

## 2016-10-16 ENCOUNTER — Encounter: Payer: Self-pay | Admitting: Gastroenterology

## 2016-10-16 ENCOUNTER — Ambulatory Visit (INDEPENDENT_AMBULATORY_CARE_PROVIDER_SITE_OTHER): Payer: Medicare Other | Admitting: Gastroenterology

## 2016-10-16 VITALS — BP 134/81 | Temp 98.4°F | Ht 67.0 in | Wt 313.0 lb

## 2016-10-16 DIAGNOSIS — R197 Diarrhea, unspecified: Secondary | ICD-10-CM | POA: Diagnosis not present

## 2016-10-16 DIAGNOSIS — K52832 Lymphocytic colitis: Secondary | ICD-10-CM | POA: Diagnosis not present

## 2016-10-16 MED ORDER — LOPERAMIDE HCL 2 MG PO TABS: 2.0000 mg | ORAL_TABLET | Freq: Four times a day (QID) | ORAL | 0 refills | Status: DC | PRN

## 2016-10-16 MED ORDER — BUDESONIDE ER 9 MG PO TB24: 9.0000 mg | ORAL_TABLET | Freq: Every morning | ORAL | 0 refills | Status: AC

## 2016-10-16 NOTE — Progress Notes (Signed)
Primary Care Physician: Venia Carbon, MD  Primary Gastroenterologist:  Dr. Lucilla Lame  Chief Complaint  Patient presents with  . biopsy results    HPI: Deborah [name] is a 83 y.o. female here for follow-up after a colonoscopy.  The patient was found to have an adenomatous polyp.  The patient's random biopsies also showed her to have lymphocytic colitis. The patient was started on cholestyramine in the past for this.  She is not sure if she is ever tried budesonide.  The patient reports that the diarrhea has continued just as it has been in the past.  Current Outpatient Prescriptions  Medication Sig Dispense Refill  . acetaminophen (TYLENOL) 500 MG tablet Take 500 mg by mouth every 6 (six) hours as needed for mild pain.     Marland Kitchen amLODipine (NORVASC) 2.5 MG tablet Take 1 tablet (2.5 mg total) by mouth 2 (two) times daily. 30 tablet 0  . aspirin EC 81 MG tablet Take 81 mg by mouth at bedtime.    . cholestyramine (QUESTRAN) 4 GM/DOSE powder Take by mouth 3 (three) times daily with meals.    . feeding supplement, ENSURE ENLIVE, (ENSURE ENLIVE) LIQD Take 237 mLs by mouth 2 (two) times daily between meals. 237 mL 12  . furosemide (LASIX) 20 MG tablet Take 20 mg by mouth daily as needed.    . simvastatin (ZOCOR) 20 MG tablet Take 20 mg by mouth at bedtime. Reported on 08/17/2015 1 tablet 1  . Budesonide ER 9 MG TB24 Take 9 mg by mouth every morning. 42 tablet 0  . loperamide (IMODIUM A-D) 2 MG tablet Take 1 tablet (2 mg total) by mouth 4 (four) times daily as needed for diarrhea or loose stools. 30 tablet 0   No current facility-administered medications for this visit.     Allergies as of 10/16/2016 - Review Complete 10/02/2016  Allergen Reaction Noted  . Ace inhibitors Cough 12/28/2010  . Ciprofloxacin hcl Other (See Comments) 05/31/2015  . Quinolones Other (See Comments) 03/17/2015    ROS:  General: Negative for anorexia, weight loss, fever, chills, fatigue, weakness. ENT:  Negative for hoarseness, difficulty swallowing , nasal congestion. CV: Negative for chest pain, angina, palpitations, dyspnea on exertion, peripheral edema.  Respiratory: Negative for dyspnea at rest, dyspnea on exertion, cough, sputum, wheezing.  GI: See history of present illness. GU:  Negative for dysuria, hematuria, urinary incontinence, urinary frequency, nocturnal urination.  Endo: Negative for unusual weight change.    Physical Examination:   BP 134/81   Temp 98.4 F (36.9 C) (Oral)   Ht 5' 7"  (1.702 m)   Wt (!) 313 lb (142 kg)   BMI 49.02 kg/m   General: Well-nourished, well-developed in no acute distress.  Eyes: No icterus. Conjunctivae pink. Extremities: No lower extremity edema. No clubbing or deformities. Neuro: Alert and oriented x 3.  Grossly intact. Skin: Warm and dry, no jaundice.   Psych: Alert and cooperative, normal mood and affect.  Labs:    Imaging Studies: No results found.  Assessment and Plan:   Deborah [name] is a 83 y.o. y/o female who comes in for follow-up after colonoscopy. The patient was found to have lymphocytic colitis.  The patient has been told to start budesonide 9 mg per day.  The patient will try that for the next 2 weeks to see if it helps her symptoms.  If it does not the patient has been told to start trying Imodium with escalating doses to control  her diarrhea.  The patient has been told not to exceed 16 mg a day.  The patient has also been told to stop the Questran.  She will contact me if her symptoms do not improve.    Lucilla Lame, MD. [name]   Note: This dictation was prepared with Dragon dictation along with smaller phrase technology. Any transcriptional errors that result from this process are unintentional.

## 2016-10-17 ENCOUNTER — Telehealth: Payer: Self-pay

## 2016-10-17 NOTE — Telephone Encounter (Signed)
Pt saw you in clinic yesterday and you have advised her to restrart the Budesonide 76m. She stated Dr. OCandace Cruisehad put her on this and was told by Dr. AVicente Malesto stop during her last office visit with him as it was not helping her at all. Please advise.

## 2016-10-19 ENCOUNTER — Telehealth: Payer: Self-pay

## 2016-10-19 NOTE — Telephone Encounter (Signed)
LVM for pt to return my call.

## 2016-10-19 NOTE — Telephone Encounter (Signed)
Pt returned call and advised per Dr. Allen Norris to restart Budesonide and Imodium together. If symptoms continue, please has been advised to contact our office.

## 2016-10-19 NOTE — Telephone Encounter (Signed)
Tell her to start it again and it may help with the imodium. Not a lot of other treatment options.

## 2016-10-19 NOTE — Telephone Encounter (Signed)
-----   Message from Lucilla Lame, MD sent at 10/09/2016  6:34 PM EDT ----- Please have the patient come in for a follow up with Dr. [name] or myself.

## 2016-10-19 NOTE — Telephone Encounter (Signed)
Pt has been scheduled with Dr. Allen Norris.

## 2016-10-23 DIAGNOSIS — J45901 Unspecified asthma with (acute) exacerbation: Secondary | ICD-10-CM

## 2016-10-24 DIAGNOSIS — G309 Alzheimer's disease, unspecified: Secondary | ICD-10-CM

## 2016-10-24 DIAGNOSIS — R351 Nocturia: Secondary | ICD-10-CM | POA: Diagnosis not present

## 2016-10-24 DIAGNOSIS — E785 Hyperlipidemia, unspecified: Secondary | ICD-10-CM

## 2016-10-24 DIAGNOSIS — E119 Type 2 diabetes mellitus without complications: Secondary | ICD-10-CM

## 2016-10-24 DIAGNOSIS — K5190 Ulcerative colitis, unspecified, without complications: Secondary | ICD-10-CM

## 2016-10-24 DIAGNOSIS — F339 Major depressive disorder, recurrent, unspecified: Secondary | ICD-10-CM

## 2016-12-25 DIAGNOSIS — E1142 Type 2 diabetes mellitus with diabetic polyneuropathy: Secondary | ICD-10-CM

## 2016-12-25 DIAGNOSIS — K52832 Lymphocytic colitis: Secondary | ICD-10-CM

## 2016-12-25 DIAGNOSIS — F39 Unspecified mood [affective] disorder: Secondary | ICD-10-CM | POA: Diagnosis not present

## 2016-12-25 DIAGNOSIS — G3109 Other frontotemporal dementia: Secondary | ICD-10-CM | POA: Diagnosis not present

## 2016-12-25 DIAGNOSIS — R3981 Functional urinary incontinence: Secondary | ICD-10-CM | POA: Diagnosis not present

## 2016-12-25 DIAGNOSIS — I10 Essential (primary) hypertension: Secondary | ICD-10-CM | POA: Diagnosis not present

## 2017-02-22 DIAGNOSIS — B351 Tinea unguium: Secondary | ICD-10-CM

## 2017-02-22 DIAGNOSIS — E119 Type 2 diabetes mellitus without complications: Secondary | ICD-10-CM

## 2017-02-22 DIAGNOSIS — G309 Alzheimer's disease, unspecified: Secondary | ICD-10-CM

## 2017-02-22 DIAGNOSIS — E785 Hyperlipidemia, unspecified: Secondary | ICD-10-CM | POA: Diagnosis not present

## 2017-02-22 DIAGNOSIS — K52832 Lymphocytic colitis: Secondary | ICD-10-CM | POA: Diagnosis not present

## 2017-02-22 DIAGNOSIS — F39 Unspecified mood [affective] disorder: Secondary | ICD-10-CM | POA: Diagnosis not present

## 2017-02-22 DIAGNOSIS — N3941 Urge incontinence: Secondary | ICD-10-CM

## 2017-02-22 DIAGNOSIS — I10 Essential (primary) hypertension: Secondary | ICD-10-CM

## 2017-02-28 DIAGNOSIS — R350 Frequency of micturition: Secondary | ICD-10-CM | POA: Diagnosis not present

## 2017-03-03 ENCOUNTER — Telehealth: Payer: Self-pay | Admitting: Family Medicine

## 2017-03-03 NOTE — Telephone Encounter (Signed)
I was called with urine clx results: klebsiella > 100K. Sensitive to multiple drugs. Pt with quinolone allergy. I started pt on cephalexin 500 mg bid x 5d.

## 2017-03-04 NOTE — Telephone Encounter (Signed)
PLEASE NOTE: All timestamps contained within this report are represented as [place] Standard Time. CONFIDENTIALTY NOTICE: This fax transmission is intended only for the addressee. It contains information that is legally privileged, confidential or otherwise protected from use or disclosure. If you are not the intended recipient, you are strictly prohibited from reviewing, disclosing, copying using or disseminating any of this information or taking any action in reliance on or regarding this information. If you have received this fax in error, please notify us immediately by telephone so that we can arrange for its return to [place]. Phone: [phone], Toll-Free: [phone], Fax: [phone] Page: 1 of 2 Call Id: [id number] [place] Patient Name: Deborah [name] Gender: Female DOB: 02/18/1930 Age: 84 Y 2 M 15 D Return Phone Number: Address: City/State/Zip: [place] Client [place] Night - Client Client Site [place] Physician Viviana Simpler - MD Contact Type Call Who Is [place] [facility] [place] Call Type Triage / Clinical Caller Name [facility] Relationship To Patient Provider Return Phone Number Please choose phone number Facility Name [facility] Number [phone] Chief Complaint Paging or Request for Consult Reason for Call Request to speak to Physician Initial Comment Rip Harbour with [facility] at [phone]. Caller has positive result on urine test. Needs order for treatment. Additional Comment Translation No Nurse Assessment Nurse: Germain Osgood, RN, Opal Sidles Date/Time Eilene Ghazi Time): 03/03/2017 5:42:51 PM Confirm and document reason for call. If symptomatic, describe symptoms. ---caller reports patient has not felt well for a weak. Had urine C & S. Grew > 100,000 Klebsiella Pneumonia. Sensitivities: Macrobid 32,  Amoxicillin/clavulanate, </=2., Ampicillin/ Subactrone? =4, Cefipine </=1, Ceftriaxone</=1, irtipenem </ =0.5, gentamycin </=1, imipenem </= 0.25, Levofloxacin</ = 0.12, Nirtofurantoin =32, pipercillin/Tazobactim < /=4, Tobramycin < /= 1, Trimeth Sulfa </=20. Cefazolin </=4 ** for uncomplicated UTI. Patient is allergic to Cipro and quinolones. No blood thinners. Patient has taken to bed and is usually independent Does the patient have any new or worsening symptoms? ---No Please document clinical information provided and list any resource used. ---Caller advised on call MD will be notified. Guidelines Guideline Title Affirmed Question Affirmed Notes Nurse Date/Time (Eastern Time) Disp. Time Eilene Ghazi Time) Disposition Final User 03/03/2017 5:52:57 PM Paged On Call back to [facility] Germain Osgood, [place] 03/03/2017 6:43:22 PM Clinical Call Yes Germain Osgood, RN, Ricard Dillon NOTE: All timestamps contained within this report are represented as [place] Standard Time. CONFIDENTIALTY NOTICE: This fax transmission is intended only for the addressee. It contains information that is legally privileged, confidential or otherwise protected from use or disclosure. If you are not the intended recipient, you are strictly prohibited from reviewing, disclosing, copying using or disseminating any of this information or taking any action in reliance on or regarding this information. If you have received this fax in error, please notify us immediately by telephone so that we can arrange for its return to [place]. Phone: [phone], Toll-Free: [phone], Fax: [phone] Page: 2 of 2 Call Id: [id number] Comments User: Susanne Greenhouse, RN Date/Time Eilene Ghazi Time): 03/03/2017 6:41:22 PM Caller reports patients last renal lab done 12/21/2016. Na 143, Potassium 4.2, Bun 23, Creatinine 1.16. User: Susanne Greenhouse, RN Date/Time Eilene Ghazi Time): 03/03/2017 6:42:03 PM DR. McGowan called back and given renal results. States he would like  patient to be started on Cephalex 500 mg po BID X 5 days. User: Susanne Greenhouse, RN Date/Time Eilene Ghazi Time): 03/03/2017 6:43:13 PM Caller informed  of MD instructions and verbalized understanding by repeating order back correctly [facility] Phone DateTime Result/Outcome Message Type Notes Crissie Sickles - MD [phone] 03/03/2017 5:52:57 PM Paged On Call Back to Call Center Doctor Paged Please call [facility] [employer] @ the call center [phone] Crissie Sickles - MD 03/03/2017 6:39:21 PM Spoke with On Call - General Message Result Dr. Nestor Ramp informed of patient's lab results for urine C & S. Requested last renal lab results.

## 2017-03-04 NOTE — Telephone Encounter (Signed)
Not clear she had UTI but appropriate to try Rx Started on cephalexin

## 2017-03-06 NOTE — Telephone Encounter (Signed)
Error

## 2017-04-14 DIAGNOSIS — J069 Acute upper respiratory infection, unspecified: Secondary | ICD-10-CM | POA: Diagnosis not present

## 2017-04-14 DIAGNOSIS — L723 Sebaceous cyst: Secondary | ICD-10-CM

## 2017-05-02 DIAGNOSIS — J45909 Unspecified asthma, uncomplicated: Secondary | ICD-10-CM

## 2017-05-02 DIAGNOSIS — E1142 Type 2 diabetes mellitus with diabetic polyneuropathy: Secondary | ICD-10-CM

## 2017-05-02 DIAGNOSIS — K52832 Lymphocytic colitis: Secondary | ICD-10-CM

## 2017-05-02 DIAGNOSIS — F39 Unspecified mood [affective] disorder: Secondary | ICD-10-CM

## 2017-05-02 DIAGNOSIS — I10 Essential (primary) hypertension: Secondary | ICD-10-CM

## 2017-05-02 DIAGNOSIS — F0390 Unspecified dementia without behavioral disturbance: Secondary | ICD-10-CM

## 2017-05-17 ENCOUNTER — Other Ambulatory Visit: Payer: Self-pay

## 2017-05-17 ENCOUNTER — Encounter
Admission: RE | Admit: 2017-05-17 | Discharge: 2017-05-17 | Disposition: A | Discharge status: Home / Self Care | Payer: Medicare Other | Source: Ambulatory Visit | Attending: Unknown Physician Specialty | Admitting: Unknown Physician Specialty

## 2017-05-17 DIAGNOSIS — I10 Essential (primary) hypertension: Secondary | ICD-10-CM | POA: Insufficient documentation

## 2017-05-17 DIAGNOSIS — Z01812 Encounter for preprocedural laboratory examination: Secondary | ICD-10-CM | POA: Insufficient documentation

## 2017-05-17 DIAGNOSIS — Z01810 Encounter for preprocedural cardiovascular examination: Secondary | ICD-10-CM | POA: Insufficient documentation

## 2017-05-17 HISTORY — DX: Ulcerative colitis, unspecified, without complications: K51.90

## 2017-05-17 HISTORY — DX: Localized edema: R60.0

## 2017-05-17 LAB — CBC WITH DIFFERENTIAL/PLATELET
BASOS ABS: 0 10*3/uL (ref 0–0.1)
Basophils Relative: 0 %
Eosinophils Absolute: 0.2 10*3/uL (ref 0–0.7)
Eosinophils Relative: 2 %
HCT: 41.3 % (ref 35.0–47.0)
Hemoglobin: 13.5 g/dL (ref 12.0–16.0)
LYMPHS PCT: 12 %
Lymphs Abs: 1.1 10*3/uL (ref 1.0–3.6)
MCH: 28.5 pg (ref 26.0–34.0)
MCHC: 32.8 g/dL (ref 32.0–36.0)
MCV: 87.1 fL (ref 80.0–100.0)
MONO ABS: 0.5 10*3/uL (ref 0.2–0.9)
Monocytes Relative: 6 %
Neutro Abs: 7.3 10*3/uL — ABNORMAL HIGH (ref 1.4–6.5)
Neutrophils Relative %: 80 %
Platelets: 196 10*3/uL (ref 150–440)
RBC: 4.74 MIL/uL (ref 3.80–5.20)
RDW: 16.4 % — AB (ref 11.5–14.5)
WBC: 9.2 10*3/uL (ref 3.6–11.0)

## 2017-05-17 LAB — BASIC METABOLIC PANEL
ANION GAP: 8 (ref 5–15)
BUN: 26 mg/dL — ABNORMAL HIGH (ref 6–20)
CALCIUM: 9 mg/dL (ref 8.9–10.3)
CO2: 28 mmol/L (ref 22–32)
Chloride: 106 mmol/L (ref 101–111)
Creatinine, Ser: 1.32 mg/dL — ABNORMAL HIGH (ref 0.44–1.00)
GFR calc Af Amer: 42 mL/min — ABNORMAL LOW (ref >60)
GFR calc non Af Amer: 36 mL/min — ABNORMAL LOW (ref >60)
Glucose, Bld: 121 mg/dL — ABNORMAL HIGH (ref 65–99)
POTASSIUM: 3.8 mmol/L (ref 3.5–5.1)
Sodium: 142 mmol/L (ref 135–145)

## 2017-05-17 NOTE — Patient Instructions (Addendum)
Your procedure is scheduled [id number], April 15th  Report to [facility]  To find out your arrival time please call [phone] between 1PM - 3PM                   on Monday, April 12th  Remember: Instructions that are not followed completely may result in serious  medical risk, up to and including death, or upon the discretion of your surgeon  and anesthesiologist your surgery may need to be rescheduled.     _X__ 1. Do not eat food after midnight the night before your procedure.                 No gum chewing or hard candies.                    You may drink clear liquids up to 2 hours before you are scheduled to arrive                  for your surgery-                   DO not drink clear liquids within 2 hours of the start of your surgery.                  Clear Liquids include:  water, apple juice without pulp, clear carbohydrate                 drink such as Clearfast of Gartorade, Black Coffee or Tea (Do not add                 anything to coffee or tea).  __X__2.  On the morning of surgery brush your teeth with toothpaste and water,                     you may rinse your mouth with mouthwash if you wish.                            Do not swallow any toothpaste of mouthwash.     _X__ 3.  No Alcohol for 24 hours before or after surgery.   _X__ 4.  Do Not Smoke or use e-cigarettes For 24 Hours Prior to Your Surgery.                 Do not use any chewable tobacco products for at least 6 hours prior to                 surgery.  ____  5.  Bring all medications with you on the day of surgery if instructed.   __X__  6.  Notify your doctor if there is any change in your medical condition      (cold, fever, infections).     Do not wear jewelry, make-up, hairpins, clips or nail polish. Do not wear lotions, powders, or perfumes. You may wear deodorant. Do not shave 48 hours prior to surgery. Men may shave face and neck. Do not  bring valuables to the hospital.    [facility] is not responsible for any belongings or valuables.  Contacts, dentures or bridgework may not be worn into surgery. Leave your suitcase in the car. After surgery it may be brought to your room. For patients admitted to the hospital, discharge time is determined by your treatment team.   Patients  discharged the day of surgery will not be allowed to drive home.   Please read over the following fact sheets that you were given:   PREPARING FOR SURGERY   ____ Take these medicines the morning of surgery with A SIP OF WATER:    1. NONE  2.   3.   4.  5.  6.  ____ Fleet Enema (as directed)   __X__ Use CHG Soap as directed. PLEASE USE EITHER THE NIGHT BEFORE OR THE MORNING                     OF SURGERY DEPENDING UPON YOUR ARRIVAL TIME.  ____ Use inhalers on the day of surgery  __X__ Stop ALL ASPIRIN PRODUCTS TODAY  _X___ Stop Anti-inflammatories NOW!!             YOU MAY CONTINUE TO TAKE TYLENOL AS NEEDED   __X__ Stop supplements until after surgery.                  STOP CRANBERRY AS OF Monday, April 8TH  _X___ Bring C-Pap to the hospital.   WEAR A LOOSE SHIRT THAT WILL BE EASY TO GET YOUR ARM THROUGH AFTER SURGERY.

## 2017-05-17 NOTE — Pre-Procedure Instructions (Signed)
  Confirmed medication list with Kalman Shan, nurse at [facility].  Preop instructions faxed to [facility] to the attention of Rose.

## 2017-05-20 ENCOUNTER — Encounter: Payer: Self-pay | Admitting: *Deleted

## 2017-05-20 NOTE — Pre-Procedure Instructions (Signed)
AS INSTRUCTED BY DR Ronelle Nigh,, LABS WITH REQUEST FOR CLEARANCE CALLED AND FAXED TO DR LETVAK. SPOKE WITH NORMA. ALSO FAXED AND LM FOR ANITA AT [facility] OFFICE

## 2017-05-23 ENCOUNTER — Telehealth: Payer: Self-pay | Admitting: Internal Medicine

## 2017-05-23 NOTE — Telephone Encounter (Signed)
Copied from [place] [id number]. Topic: Quick Communication - See Telephone Encounter >> May 23, 2017  9:34 AM Neva Seat wrote: Venida Jarvis w/ anesthesia @ [facility] - Phone [phone] / [phone] Fax  They faxed a surgical clearance form  on 05-20-17.  They are calling to check on the status of the from being completed. Pt's surgery is on Mon. 15th.

## 2017-05-23 NOTE — Telephone Encounter (Signed)
Spoke to [facility]. She will get with Nevin Bloodgood.

## 2017-05-23 NOTE — Telephone Encounter (Signed)
These do not come to me. They usually go straight to him. Will see if has it.

## 2017-05-23 NOTE — Pre-Procedure Instructions (Signed)
CLEARED BY DR Silvio Pate AND ON CHART

## 2017-05-23 NOTE — Telephone Encounter (Signed)
I did do the form at [facility] and gave it to the clerk, Nevin Bloodgood, to fax back. I did clear her. May want to check with Nevin Bloodgood about whether it was fax, confirm number,etc Her number is [phone]

## 2017-05-26 MED ORDER — CEFAZOLIN SODIUM-DEXTROSE 1-4 GM/50ML-% IV SOLN
1.0000 g | Freq: Once | INTRAVENOUS | Status: AC
  Administered 2017-05-27: 2 g via INTRAVENOUS

## 2017-05-27 ENCOUNTER — Ambulatory Visit: Payer: Medicare Other | Admitting: Anesthesiology

## 2017-05-27 ENCOUNTER — Other Ambulatory Visit: Payer: Self-pay

## 2017-05-27 ENCOUNTER — Encounter
Admission: RE | Disposition: A | Discharge status: Nursing Home (Includes ICF) | Payer: Self-pay | Source: Ambulatory Visit | Attending: Unknown Physician Specialty

## 2017-05-27 ENCOUNTER — Ambulatory Visit
Admission: RE | Admit: 2017-05-27 | Discharge: 2017-05-27 | Disposition: A | Discharge status: Nursing Home (Includes ICF) | Payer: Medicare Other | Source: Ambulatory Visit | Attending: Unknown Physician Specialty | Admitting: Unknown Physician Specialty

## 2017-05-27 DIAGNOSIS — Z7982 Long term (current) use of aspirin: Secondary | ICD-10-CM | POA: Diagnosis not present

## 2017-05-27 DIAGNOSIS — G4730 Sleep apnea, unspecified: Secondary | ICD-10-CM | POA: Diagnosis not present

## 2017-05-27 DIAGNOSIS — E785 Hyperlipidemia, unspecified: Secondary | ICD-10-CM | POA: Diagnosis not present

## 2017-05-27 DIAGNOSIS — Z87891 Personal history of nicotine dependence: Secondary | ICD-10-CM | POA: Diagnosis not present

## 2017-05-27 DIAGNOSIS — Z7952 Long term (current) use of systemic steroids: Secondary | ICD-10-CM | POA: Diagnosis not present

## 2017-05-27 DIAGNOSIS — E1140 Type 2 diabetes mellitus with diabetic neuropathy, unspecified: Secondary | ICD-10-CM | POA: Insufficient documentation

## 2017-05-27 DIAGNOSIS — Z79899 Other long term (current) drug therapy: Secondary | ICD-10-CM | POA: Diagnosis not present

## 2017-05-27 DIAGNOSIS — Z96653 Presence of artificial knee joint, bilateral: Secondary | ICD-10-CM | POA: Insufficient documentation

## 2017-05-27 DIAGNOSIS — Z7984 Long term (current) use of oral hypoglycemic drugs: Secondary | ICD-10-CM | POA: Diagnosis not present

## 2017-05-27 DIAGNOSIS — M720 Palmar fascial fibromatosis [Dupuytren]: Secondary | ICD-10-CM | POA: Insufficient documentation

## 2017-05-27 DIAGNOSIS — F419 Anxiety disorder, unspecified: Secondary | ICD-10-CM | POA: Diagnosis not present

## 2017-05-27 DIAGNOSIS — Z6841 Body Mass Index (BMI) 40.0 and over, adult: Secondary | ICD-10-CM | POA: Insufficient documentation

## 2017-05-27 DIAGNOSIS — F329 Major depressive disorder, single episode, unspecified: Secondary | ICD-10-CM | POA: Diagnosis not present

## 2017-05-27 DIAGNOSIS — I10 Essential (primary) hypertension: Secondary | ICD-10-CM | POA: Insufficient documentation

## 2017-05-27 HISTORY — PX: DUPUYTREN CONTRACTURE RELEASE: SHX1478

## 2017-05-27 LAB — GLUCOSE, CAPILLARY
Glucose-Capillary: 96 mg/dL (ref 65–99)
Glucose-Capillary: 125 mg/dL — ABNORMAL HIGH (ref 65–99)

## 2017-05-27 SURGERY — RELEASE, DUPUYTREN CONTRACTURE
Anesthesia: General | Site: Hand | Laterality: Left | Wound class: Clean

## 2017-05-27 MED ORDER — SODIUM CHLORIDE 0.9 % IV SOLN
INTRAVENOUS | Status: DC
  Administered 2017-05-27: 07:00:00 via INTRAVENOUS

## 2017-05-27 MED ORDER — CEFAZOLIN SODIUM-DEXTROSE 2-3 GM-%(50ML) IV SOLR
INTRAVENOUS | Status: AC
  Filled 2017-05-27: qty 50

## 2017-05-27 MED ORDER — ONDANSETRON HCL 4 MG/2ML IJ SOLN
INTRAMUSCULAR | Status: AC
  Administered 2017-05-27: 4 mg via INTRAVENOUS
  Filled 2017-05-27: qty 2

## 2017-05-27 MED ORDER — LIDOCAINE HCL (PF) 2 % IJ SOLN
INTRAMUSCULAR | Status: AC
  Filled 2017-05-27: qty 10

## 2017-05-27 MED ORDER — GLYCOPYRROLATE 0.2 MG/ML IJ SOLN
INTRAMUSCULAR | Status: AC
  Filled 2017-05-27: qty 1

## 2017-05-27 MED ORDER — PHENYLEPHRINE HCL 10 MG/ML IJ SOLN
INTRAMUSCULAR | Status: AC
  Filled 2017-05-27: qty 1

## 2017-05-27 MED ORDER — ONDANSETRON HCL 4 MG/2ML IJ SOLN
4.0000 mg | Freq: Once | INTRAMUSCULAR | Status: AC | PRN
  Administered 2017-05-27: 4 mg via INTRAVENOUS

## 2017-05-27 MED ORDER — SUCCINYLCHOLINE CHLORIDE 20 MG/ML IJ SOLN
INTRAMUSCULAR | Status: AC
Start: 2017-05-27 — End: ?
  Filled 2017-05-27: qty 1

## 2017-05-27 MED ORDER — EPHEDRINE SULFATE 50 MG/ML IJ SOLN
INTRAMUSCULAR | Status: AC
Start: 2017-05-27 — End: ?
  Filled 2017-05-27: qty 1

## 2017-05-27 MED ORDER — FENTANYL CITRATE (PF) 100 MCG/2ML IJ SOLN
INTRAMUSCULAR | Status: AC
  Filled 2017-05-27: qty 2

## 2017-05-27 MED ORDER — FAMOTIDINE 20 MG PO TABS
20.0000 mg | ORAL_TABLET | Freq: Once | ORAL | Status: AC
  Administered 2017-05-27: 20 mg via ORAL

## 2017-05-27 MED ORDER — FENTANYL CITRATE (PF) 100 MCG/2ML IJ SOLN
INTRAMUSCULAR | Status: DC | PRN
  Administered 2017-05-27 (×2): 50 ug via INTRAVENOUS

## 2017-05-27 MED ORDER — FENTANYL CITRATE (PF) 100 MCG/2ML IJ SOLN
25.0000 ug | INTRAMUSCULAR | Status: DC | PRN
  Administered 2017-05-27 (×4): 25 ug via INTRAVENOUS

## 2017-05-27 MED ORDER — DEXMEDETOMIDINE HCL IN NACL 200 MCG/50ML IV SOLN
INTRAVENOUS | Status: AC
  Filled 2017-05-27: qty 50

## 2017-05-27 MED ORDER — LIDOCAINE HCL (CARDIAC) 20 MG/ML IV SOLN
INTRAVENOUS | Status: DC | PRN
  Administered 2017-05-27: 100 mg via INTRAVENOUS

## 2017-05-27 MED ORDER — MIDAZOLAM HCL 2 MG/2ML IJ SOLN
INTRAMUSCULAR | Status: AC
  Filled 2017-05-27: qty 2

## 2017-05-27 MED ORDER — FAMOTIDINE 20 MG PO TABS
ORAL_TABLET | ORAL | Status: AC
  Administered 2017-05-27: 20 mg via ORAL
  Filled 2017-05-27: qty 1

## 2017-05-27 MED ORDER — GLYCOPYRROLATE 0.2 MG/ML IJ SOLN
INTRAMUSCULAR | Status: DC | PRN
  Administered 2017-05-27: 0.2 mg via INTRAVENOUS

## 2017-05-27 MED ORDER — PROPOFOL 10 MG/ML IV BOLUS
INTRAVENOUS | Status: DC | PRN
  Administered 2017-05-27: 100 mg via INTRAVENOUS
  Administered 2017-05-27: 50 mg via INTRAVENOUS
  Administered 2017-05-27: 200 mg via INTRAVENOUS

## 2017-05-27 MED ORDER — BUPIVACAINE HCL (PF) 0.5 % IJ SOLN: INTRAMUSCULAR | Status: DC | PRN

## 2017-05-27 MED ORDER — CEFAZOLIN SODIUM-DEXTROSE 1-4 GM/50ML-% IV SOLN
INTRAVENOUS | Status: AC
  Filled 2017-05-27: qty 50

## 2017-05-27 MED ORDER — EPHEDRINE SULFATE 50 MG/ML IJ SOLN
INTRAMUSCULAR | Status: DC | PRN
  Administered 2017-05-27 (×4): 10 mg via INTRAVENOUS

## 2017-05-27 MED ORDER — NORCO 5-325 MG PO TABS: 1.0000 | ORAL_TABLET | Freq: Four times a day (QID) | ORAL | 0 refills | Status: DC | PRN

## 2017-05-27 MED ORDER — ONDANSETRON HCL 4 MG/2ML IJ SOLN
INTRAMUSCULAR | Status: AC
  Filled 2017-05-27: qty 2

## 2017-05-27 MED ORDER — PHENYLEPHRINE HCL 10 MG/ML IJ SOLN
INTRAMUSCULAR | Status: DC | PRN
  Administered 2017-05-27: 100 ug via INTRAVENOUS
  Administered 2017-05-27: 50 ug via INTRAVENOUS

## 2017-05-27 MED ORDER — FENTANYL CITRATE (PF) 100 MCG/2ML IJ SOLN
INTRAMUSCULAR | Status: AC
  Administered 2017-05-27: 25 ug via INTRAVENOUS
  Filled 2017-05-27: qty 2

## 2017-05-27 MED ORDER — SUCCINYLCHOLINE CHLORIDE 20 MG/ML IJ SOLN
INTRAMUSCULAR | Status: DC | PRN
  Administered 2017-05-27: 140 mg via INTRAVENOUS

## 2017-05-27 MED ORDER — DEXAMETHASONE SODIUM PHOSPHATE 10 MG/ML IJ SOLN
INTRAMUSCULAR | Status: DC | PRN
  Administered 2017-05-27: 10 mg via INTRAVENOUS

## 2017-05-27 MED ORDER — ONDANSETRON HCL 4 MG/2ML IJ SOLN
INTRAMUSCULAR | Status: DC | PRN
  Administered 2017-05-27: 4 mg via INTRAVENOUS

## 2017-05-27 MED ORDER — PROPOFOL 10 MG/ML IV BOLUS
INTRAVENOUS | Status: AC
  Filled 2017-05-27: qty 40

## 2017-05-27 MED ORDER — DEXAMETHASONE SODIUM PHOSPHATE 10 MG/ML IJ SOLN
INTRAMUSCULAR | Status: AC
  Filled 2017-05-27: qty 1

## 2017-05-27 SURGICAL SUPPLY — 37 items
BLADE SURG 15 STRL LF DISP TIS (BLADE) ×2 IMPLANT
BLADE SURG 15 STRL SS (BLADE) ×4
BNDG ESMARK 4X12 TAN STRL LF (GAUZE/BANDAGES/DRESSINGS) ×3 IMPLANT
CAST PADDING 3X4FT ST 30246 (SOFTGOODS) ×2
CLOSURE WOUND 1/2 X4 (GAUZE/BANDAGES/DRESSINGS) ×1
CUFF DUAL TOURNIQUET 18IN DISP (TOURNIQUET CUFF) ×3 IMPLANT
CUFF TOURN 24 STER (MISCELLANEOUS) ×3 IMPLANT
DURAPREP 26ML APPLICATOR (WOUND CARE) ×3 IMPLANT
ELECT REM PT RETURN 9FT ADLT (ELECTROSURGICAL) ×3
ELECTRODE REM PT RTRN 9FT ADLT (ELECTROSURGICAL) ×1 IMPLANT
GAUZE PETRO XEROFOAM 1X8 (MISCELLANEOUS) ×3 IMPLANT
GAUZE SPONGE 4X4 12PLY STRL (GAUZE/BANDAGES/DRESSINGS) ×9 IMPLANT
GAUZE XEROFORM 4X4 STRL (GAUZE/BANDAGES/DRESSINGS) ×3 IMPLANT
GLOVE BIO SURGEON STRL SZ8 (GLOVE) ×6 IMPLANT
GLOVE BIOGEL M STRL SZ7.5 (GLOVE) ×3 IMPLANT
GLOVE BIOGEL PI IND STRL 8 (GLOVE) ×1 IMPLANT
GLOVE BIOGEL PI INDICATOR 8 (GLOVE) ×2
GOWN STRL REUS W/ TWL LRG LVL3 (GOWN DISPOSABLE) ×2 IMPLANT
GOWN STRL REUS W/TWL LRG LVL3 (GOWN DISPOSABLE) ×4
GOWN STRL REUS W/TWL LRG LVL4 (GOWN DISPOSABLE) ×3 IMPLANT
LOOP VESSEL MINI 0.8X406 BLUE (MISCELLANEOUS) ×1 IMPLANT
LOOPS BLUE MINI 0.8X406MM (MISCELLANEOUS) ×2
NS IRRIG 1000ML POUR BTL (IV SOLUTION) ×3 IMPLANT
OINTMENT BETADINE 1.5GM (MISCELLANEOUS) ×3 IMPLANT
PACK EXTREMITY ARMC (MISCELLANEOUS) ×3 IMPLANT
PAD CAST CTTN 3X4 STRL (SOFTGOODS) ×1 IMPLANT
PAD PREP 24X41 OB/GYN DISP (PERSONAL CARE ITEMS) ×3 IMPLANT
PADDING CAST 3IN STRL (MISCELLANEOUS) ×2
PADDING CAST BLEND 3X4 STRL (MISCELLANEOUS) ×1 IMPLANT
SPLINT CAST 1 STEP 3X12 (MISCELLANEOUS) ×3 IMPLANT
SPONGE GAUZE 2X2 8PLY STER LF (GAUZE/BANDAGES/DRESSINGS) ×1
SPONGE GAUZE 2X2 8PLY STRL LF (GAUZE/BANDAGES/DRESSINGS) ×2 IMPLANT
STOCKINETTE BIAS CUT 3 980034 (MISCELLANEOUS) ×3 IMPLANT
STOCKINETTE STRL 4IN 9604848 (GAUZE/BANDAGES/DRESSINGS) ×3 IMPLANT
STRIP CLOSURE SKIN 1/2X4 (GAUZE/BANDAGES/DRESSINGS) ×2 IMPLANT
SUT ETHILON 5 0 P 3 18 (SUTURE) ×14
SUT NYLON ETHILON 5-0 P-3 1X18 (SUTURE) ×7 IMPLANT

## 2017-05-27 NOTE — Anesthesia Postprocedure Evaluation (Signed)
Anesthesia Post Note  Patient: Deborah [name]  Procedure(s) Performed: DUPUYTREN CONTRACTURE RELEASE-LEFT INDEX AND PINKY FINGER (Left Hand)  Patient location during evaluation: PACU Anesthesia Type: General Level of consciousness: awake and alert Pain management: pain level controlled Vital Signs Assessment: post-procedure vital signs reviewed and stable Respiratory status: spontaneous breathing and respiratory function stable Cardiovascular status: stable Anesthetic complications: no     Last Vitals:  Vitals:   05/27/17 1040 05/27/17 1045  BP:  (!) 93/55  Pulse: 72 73  Resp: 14 12  Temp:  (!) 36 C  SpO2: 90% 93%    Last Pain:  Vitals:   05/27/17 1050  TempSrc:   PainSc: 0-No pain                 Bertine Schlottman K

## 2017-05-27 NOTE — Discharge Instructions (Signed)
AMBULATORY SURGERY  DISCHARGE INSTRUCTIONS   1) The drugs that you were given will stay in your system until tomorrow so for the next 24 hours you should not:  A) Drive an automobile B) Make any legal decisions C) Drink any alcoholic beverage   2) You may resume regular meals tomorrow.  Today it is better to start with liquids and gradually work up to solid foods.  You may eat anything you prefer, but it is better to start with liquids, then soup and crackers, and gradually work up to solid foods.   3) Please notify your doctor immediately if you have any unusual bleeding, trouble breathing, redness and pain at the surgery site, drainage, fever, or pain not relieved by medication. 4)   5) Your post-operative visit with Dr.                                     is: Date:                        Time:    Please call to schedule your post-operative visit.  6) Additional Instructions:       Elevation  Ice pack  RTC in about 10 days  Keep dressing dry

## 2017-05-27 NOTE — Op Note (Signed)
05/27/2017  10:23 AM  PATIENT:  Deborah [name]  84 y.o. female  PRE-OPERATIVE DIAGNOSIS:  DUPUTRENS CONTRACTURE OF LEFT HAND  POST-OPERATIVE DIAGNOSIS:  DUPUTRENS CONTRACTURE OF LEFT HAND  PROCEDURE:  Procedure(s): DUPUYTREN CONTRACTURE RELEASE-LEFT INDEX AND PINKY FINGER (Left)  SURGEON:   Leanor Kail, Brooke Bonito., MD  ANESTHESIA: General  HISTORY: The patient had a long history of Dupuytren's contracture of her left little finger and her left index finger.  Patient was brought in for release of these contractures.  OP NOTE: The patient was taken to the operating room where satisfactory general anesthesia was achieved.  A tourniquet was applied to the patient's left upper extremity.  Left upper extremity was then prepped and draped in usual fashion for a procedure about the hand.  Patient was given 1 g of Kefzol IV prior to the start of the procedure.  Left upper extremity was exsanguinated and the tourniquet was inflated.  I made a Z-type incision over the volar aspect of the left little finger beginning in the palm and extending to the PIP joint.  Blunt dissection was carried down to the contracted band.  The band was divided proximally and then dissected distally to the PIP joint.  Care was taken to protect the neurovascular bundles.  The contracted band was then divided at the PIP joint.  After resection of the contracted band I was able to passively almost fully extend the left little finger. Next I  made about a centimeter and a half longitudinal incision over the midportion of the Dupuytren's band that was present in line with the left index finger.  I resected about a centimeter and a half of the band thus releasing the tension on the band that was subluxing the left index finger. The tourniquet was released at this time.  It had been up about 43 minutes.  Bleeding was controlled with coagulation cautery and digital pressure.  The wounds were irrigated with saline.  The incisions were closed  with 5-0 nylon in vertical mattress fashion.  A compression dressing along with a volar splint was applied.  The patient was then awakened and transferred to her stretcher bed and taken to the recovery room in satisfactory condition.  Blood loss was negligible.

## 2017-05-27 NOTE — Anesthesia Post-op Follow-up Note (Signed)
Anesthesia QCDR form completed.        

## 2017-05-27 NOTE — Anesthesia Procedure Notes (Signed)
Procedure Name: Intubation Date/Time: 05/27/2017 7:45 AM Performed by: Doreen Salvage, CRNA Pre-anesthesia Checklist: Patient identified, Patient being monitored, Timeout performed, Emergency Drugs available and Suction available Patient Re-evaluated:Patient Re-evaluated prior to induction Oxygen Delivery Method: Circle system utilized Preoxygenation: Pre-oxygenation with 100% oxygen Induction Type: IV induction Ventilation: Mask ventilation without difficulty Laryngoscope Size: Mac and 3 Grade View: Grade I Tube type: Oral Tube size: 7.0 mm Number of attempts: 1 Airway Equipment and Method: Stylet Placement Confirmation: ETT inserted through vocal cords under direct vision,  positive ETCO2 and breath sounds checked- equal and bilateral Secured at: 21 cm Tube secured with: Tape Dental Injury: Teeth and Oropharynx as per pre-operative assessment

## 2017-05-27 NOTE — Transfer of Care (Signed)
Immediate Anesthesia Transfer of Care Note  Patient: Deborah [name]  Procedure(s) Performed: Procedure(s): DUPUYTREN CONTRACTURE RELEASE-LEFT INDEX AND PINKY FINGER (Left)  Patient Location: PACU  Anesthesia Type:General  Level of Consciousness: sedated  Airway & Oxygen Therapy: Patient Spontanous Breathing and Patient connected to face mask oxygen  Post-op Assessment: Report given to RN and Post -op Vital signs reviewed and stable  Post vital signs: Reviewed and stable  Last Vitals:  Vitals:   05/27/17 0625 05/27/17 0955  BP: (!) 150/79 (!) 116/44  Pulse: 69 77  Resp: 18 19  Temp: (!) 36.3 C 36.6 C  SpO2: 29% 79%    Complications: No apparent anesthesia complications

## 2017-05-27 NOTE — Anesthesia Preprocedure Evaluation (Signed)
Anesthesia Evaluation  Patient identified by MRN, date of birth, ID band Patient awake    Reviewed: Allergy & Precautions, NPO status , Patient's Chart, lab work & pertinent test results  History of Anesthesia Complications Negative for: history of anesthetic complications  Airway Mallampati: IV       Dental   Pulmonary sleep apnea and Continuous Positive Airway Pressure Ventilation , neg COPD, former smoker,           Cardiovascular hypertension, Pt. on medications (-) Past MI and (-) CHF (-) dysrhythmias (-) Valvular Problems/Murmurs     Neuro/Psych neg Seizures Anxiety Depression Dementia    GI/Hepatic Neg liver ROS, PUD, neg GERD  ,  Endo/Other  diabetes, Type 2, Oral Hypoglycemic AgentsHypothyroidism Morbid obesity  Renal/GU Renal InsufficiencyRenal disease     Musculoskeletal   Abdominal   Peds  Hematology   Anesthesia Other Findings   Reproductive/Obstetrics                             Anesthesia Physical Anesthesia Plan  ASA: III  Anesthesia Plan: General   Post-op Pain Management:    Induction: Intravenous  PONV Risk Score and Plan: 3 and Ondansetron, Dexamethasone and Midazolam  Airway Management Planned: Oral ETT  Additional Equipment:   Intra-op Plan:   Post-operative Plan:   Informed Consent: I have reviewed the patients History and Physical, chart, labs and discussed the procedure including the risks, benefits and alternatives for the proposed anesthesia with the patient or authorized representative who has indicated his/her understanding and acceptance.     Plan Discussed with:   Anesthesia Plan Comments:         Anesthesia Quick Evaluation

## 2017-05-27 NOTE — H&P (Signed)
  H and P reviewed. No changes. Uploaded at later date.

## 2017-05-28 ENCOUNTER — Encounter: Payer: Self-pay | Admitting: Unknown Physician Specialty

## 2017-06-18 ENCOUNTER — Ambulatory Visit: Payer: Medicare Other | Attending: Unknown Physician Specialty | Admitting: Occupational Therapy

## 2017-06-18 ENCOUNTER — Encounter: Payer: Self-pay | Admitting: Occupational Therapy

## 2017-06-18 ENCOUNTER — Other Ambulatory Visit: Payer: Self-pay

## 2017-06-18 DIAGNOSIS — M720 Palmar fascial fibromatosis [Dupuytren]: Secondary | ICD-10-CM | POA: Diagnosis present

## 2017-06-18 DIAGNOSIS — M25642 Stiffness of left hand, not elsewhere classified: Secondary | ICD-10-CM | POA: Insufficient documentation

## 2017-06-18 DIAGNOSIS — M79642 Pain in left hand: Secondary | ICD-10-CM | POA: Insufficient documentation

## 2017-06-18 DIAGNOSIS — L905 Scar conditions and fibrosis of skin: Secondary | ICD-10-CM | POA: Diagnosis present

## 2017-06-18 DIAGNOSIS — M6281 Muscle weakness (generalized): Secondary | ICD-10-CM | POA: Diagnosis present

## 2017-06-18 NOTE — Patient Instructions (Signed)
Heat  Scar massage  cica scar pad for night time  And silicon sleeve 5th digit   AROM tendon glides   Splint wearing for night time only

## 2017-06-18 NOTE — Therapy (Signed)
[facility] [address], [place], [address] Phone: [phone]   Fax:  [phone]  Occupational Therapy Evaluation  Patient Details  Name: Deborah [name] MRN: [id number] Date of Birth: Sep 26, 1930 Referring Provider: Dr Franchot Mimes    Encounter Date: 06/18/2017  OT End of Session - 06/18/17 2153    Visit Number  1    Number of Visits  8    Date for OT Re-Evaluation  07/30/17    OT Start Time  1405    OT Stop Time  1456    OT Time Calculation (min)  51 min    Activity Tolerance  Patient tolerated treatment well    Behavior During Therapy  [facility] for tasks assessed/performed       Past Medical History:  Diagnosis Date  . 'light-for-dates' infant with signs of fetal malnutrition   . Anxiety   . Chronic kidney disease, stage III (moderate) (HCC)   . Contracture of palmar fascia   . Cough   . Diabetes mellitus 2019   all diabetic meds stopped upon arrival to [facility]  . Hyperlipidemia   . Hypertension   . Lower extremity edema   . Obesity   . Osteoarthritis   . Other specified acquired hypothyroidism   . Otitis externa   . Palpitations   . Peripheral neuropathy   . Pure hypercholesterolemia   . Secondary hyperparathyroidism (of renal origin)   . Sleep apnea   . Ulcerative colitis ([place])   . Unspecified adjustment reaction   . Unspecified hereditary and idiopathic peripheral neuropathy   . Unspecified urinary incontinence     Past Surgical History:  Procedure Laterality Date  . BACK SURGERY  1990   Ruptured Disc.  Marland Kitchen CARDIAC CATHETERIZATION  03/2008   due to chest pain  . COLONOSCOPY    . COLONOSCOPY WITH PROPOFOL N/A 10/02/2016   Procedure: COLONOSCOPY WITH PROPOFOL;  Surgeon: Lucilla Lame, MD;  Location: [facility] ENDOSCOPY;  Service: Endoscopy;  Laterality: N/A;  . DUPUYTREN CONTRACTURE RELEASE Left 05/27/2017   Procedure: DUPUYTREN CONTRACTURE RELEASE-LEFT INDEX AND PINKY FINGER;  Surgeon:  Leanor Kail, MD;  Location: [facility] ORS;  Service: Orthopedics;  Laterality: Left;  . EYE SURGERY Bilateral 2014   cataract extractions with IOL implant  . HAND SURGERY Right 1999   dupeytren release  . REPLACEMENT TOTAL KNEE Bilateral 2004, 1999  . SHOULDER SURGERY Right 06/2007   rotator cuff repair    There were no vitals filed for this visit.  Subjective Assessment - 06/18/17 2142    Subjective   I had my surgery and kept in bandage like this since Dr Jefm Bryant took my stitches out - it I touch my tip of pinkie it is tender - I had my other hand done more than 5 yrs ago - I could not grasp my cane anymore - that why I needed the surgery     Patient Stated Goals  I want to be able to get my hand open - to use my can and not just w/c , wash my face , and reach in pocket - want to tenderness and range of motion in ,my pinkie better     Currently in Pain?  Yes    Pain Score  3     Pain Location  Finger (Comment which one)    Pain Orientation  Left    Pain Descriptors / Indicators  Tender    Pain Type  Surgical  pain    Pain Onset  1 to 4 weeks ago    Pain Frequency  Intermittent        [facility] OT Assessment - 06/18/17 0001      Assessment   Medical Diagnosis  L dupuytrens release    Referring Provider  Dr Franchot Mimes     Onset Date/Surgical Date  05/27/17    Hand Dominance  Right    Next MD Visit  -- end of month       Precautions   Required Braces or Orthoses  -- custom made for dorsal extention of 5th and 4th digits      Home  Environment   Lives With  -- Skilled nursing      Prior Function   Vocation  Retired    Leisure  likes to read and watch tv - use w/c and walker       Left Hand AROM   L Index  MCP 0-90  75 Degrees    L Index PIP 0-100  100 Degrees    L Long  MCP 0-90  85 Degrees    L Long PIP 0-100  95 Degrees    L Ring  MCP 0-90  80 Degrees    L Ring PIP 0-100  95 Degrees    L Little  MCP 0-90  80 Degrees -20     L Little PIP 0-100  75 Degrees -30       fabricated custom splint for hand - dorsal hand base extention splint for 5th and 4th digit  To wear night time   tubigrip sock underneath  And ed on use of cica scar pad     review HEP with pt and hand out provided  Heat  Scar massage  cica scar pad for night time  And silicon sleeve 5th digit   AROM tendon glides  PROm for 5th PIP extention   Splint wearing for night time only            OT Education - 06/18/17 2153    Education provided  Yes    Education Details  findings of eval - homeprogram     Person(s) Educated  Patient    Methods  Demonstration;Tactile cues;Verbal cues;Handout    Comprehension  Returned demonstration;Verbalized understanding       OT Short Term Goals - 06/18/17 2159      OT SHORT TERM GOAL #1   Title  Pt to be independent in HEP to wear splint to prevent flexor contracture  , improve scar massage and increase AROM in L hand     Baseline  no knowledge     Time  3    Period  Weeks    Status  New    Target Date  07/09/17      OT SHORT TERM GOAL #2   Title  L AROM in 5th digit increase for pt to touch palm to use hand in gripping act during ADl's     Baseline  PIP flexion 75 and adduction under or over other digits     Time  3    Period  Weeks    Status  New    Target Date  07/09/17        OT Long Term Goals - 06/18/17 2201      OT LONG TERM GOAL #1   Title  pain and tenderness in L hand improve and scar for pt to tolerete different textures , massage  and weight bearing to prepare for use of walker     Baseline  bandage just removed and scabs - tenderness 3/10 - and pushing on 5th increase to 7/10     Time  6    Period  Weeks    Status  New    Target Date  07/30/17      OT LONG TERM GOAL #2   Title  Pt grip strength in L hand improve to 50% at least compare to R hand to use hand in ADL's , mobilty     Baseline  NT     Time  6    Period  Weeks    Status  New    Target Date  07/30/17            Plan - 06/18/17  2154    Clinical Impression Statement  Pt present 3 wks s/p Lhand dupytrens release - Z  long scar on ulnar side of palm into  volar 5th middle phalanges , and in palm at 2nd digit- 2 cm scar -  pt arrive with bandages still in place applied week ago when stitches come out - removed bandages, sterri strips and dry scabs - pt washed hand and pt ed on scar management - fabricated hand base dorsal extention splint for 5th and 4th digit- pt show increase scar tissue , increase tenderness , pain and decreae AROM  and functional use - pt can benefit from OT services     Occupational performance deficits (Please refer to evaluation for details):  ADL's;Play mobility using walker     Rehab Potential  Good    OT Frequency  -- 1-2 x wk     OT Duration  6 weeks    OT Treatment/Interventions  Self-care/ADL training;Therapeutic exercise;Patient/family education;Splinting;Paraffin;Ultrasound;Contrast Bath;Fluidtherapy;Manual Therapy;Passive range of motion;Scar mobilization    Plan  assess scar healing , splint use , and ROM - adjust and review HEP as needed     Clinical Decision Making  Several treatment options, min-mod task modification necessary    OT Home Exercise Plan  see pt instruction     Consulted and Agree with Plan of Care  Patient       Patient will benefit from skilled therapeutic intervention in order to improve the following deficits and impairments:  Decreased skin integrity, Impaired flexibility, Pain, Decreased scar mobility, Impaired UE functional use, Decreased strength, Decreased range of motion  Visit Diagnosis: Dupuytren's contracture of left hand - Plan: Ot plan of care cert/re-cert  Scar condition and fibrosis of skin - Plan: Ot plan of care cert/re-cert  Stiffness of left hand, not elsewhere classified - Plan: Ot plan of care cert/re-cert  Pain in left hand - Plan: Ot plan of care cert/re-cert  Muscle weakness (generalized) - Plan: Ot plan of care cert/re-cert    Problem  List Patient Active Problem List   Diagnosis Date Noted  . Benign neoplasm of cecum   . Benign neoplasm of transverse colon   . Lewy body dementia without behavioral disturbance   . Palliative care by specialist   . DNR (do not resuscitate)   . Goals of care, counseling/discussion   . Sepsis ([place]) 02/24/2016  . Urinary tract infection without hematuria 02/24/2016  . Memory loss 07/18/2015  . Depression 01/12/2015  . Shoulder pain 12/17/2014  . Pain in shoulder 08/05/2014  . Gonalgia 07/21/2014  . History of knee surgery 07/21/2014  . Allergic rhinitis 07/16/2014  . Anxiety 07/16/2014  . Benign hypertension  07/16/2014  . Alteration in bowel elimination: incontinence 07/16/2014  . Chronic kidney disease (CKD), stage III (moderate) ([place]) 07/16/2014  . Colitis 07/16/2014  . CN (constipation) 07/16/2014  . Diarrhea 07/16/2014  . Contracture of palmar fascia (Dupuytren's) 07/16/2014  . Hypercholesteremia 07/16/2014  . Hypoxia 07/16/2014  . Absence of bladder continence 07/16/2014  . Focal lymphocytic colitis 07/16/2014  . Extreme obesity 07/16/2014  . Arthritis, degenerative 07/16/2014  . Disorder of peripheral nervous system 07/16/2014  . Neuralgia neuritis, sciatic nerve 07/16/2014  . Hyperparathyroidism, secondary ([place]) 07/16/2014  . Apnea, sleep 07/16/2014  . Subclinical hypothyroidism 07/16/2014  . Controlled type 2 diabetes mellitus ([place]) 07/16/2014  . 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 Urinary incontinence without sensory awareness 09/03/2012  . Palpitations 12/28/2010  . SOB (shortness of breath) 12/28/2010  . Overweight 08/02/2009    Rosalyn Gess OTR/l,CLT 06/18/2017, 10:06 PM  [facility] [address] [place], [address] Phone: [phone]   Fax:  [phone]  Name: SENDY PLUTA MRN: [id number] Date of Birth: 18-Mar-1930

## 2017-06-21 ENCOUNTER — Ambulatory Visit: Payer: Medicare Other | Admitting: Occupational Therapy

## 2017-06-25 ENCOUNTER — Ambulatory Visit: Payer: Medicare Other | Admitting: Occupational Therapy

## 2017-06-27 ENCOUNTER — Encounter: Payer: Medicare Other | Admitting: Occupational Therapy

## 2017-06-28 DIAGNOSIS — E1140 Type 2 diabetes mellitus with diabetic neuropathy, unspecified: Secondary | ICD-10-CM | POA: Diagnosis not present

## 2017-06-28 DIAGNOSIS — G309 Alzheimer's disease, unspecified: Secondary | ICD-10-CM | POA: Diagnosis not present

## 2017-06-28 DIAGNOSIS — E785 Hyperlipidemia, unspecified: Secondary | ICD-10-CM

## 2017-06-28 DIAGNOSIS — F39 Unspecified mood [affective] disorder: Secondary | ICD-10-CM

## 2017-06-28 DIAGNOSIS — S45909A Unspecified injury of unspecified blood vessel at shoulder and upper arm level, unspecified arm, initial encounter: Secondary | ICD-10-CM | POA: Diagnosis not present

## 2017-06-28 DIAGNOSIS — I10 Essential (primary) hypertension: Secondary | ICD-10-CM

## 2017-06-28 DIAGNOSIS — K52839 Microscopic colitis, unspecified: Secondary | ICD-10-CM | POA: Diagnosis not present

## 2017-06-28 DIAGNOSIS — N3941 Urge incontinence: Secondary | ICD-10-CM | POA: Diagnosis not present

## 2017-07-10 DIAGNOSIS — R609 Edema, unspecified: Secondary | ICD-10-CM | POA: Diagnosis not present

## 2017-08-14 DIAGNOSIS — N3281 Overactive bladder: Secondary | ICD-10-CM | POA: Diagnosis not present

## 2017-09-16 ENCOUNTER — Encounter: Payer: Self-pay | Admitting: Podiatry

## 2017-09-16 ENCOUNTER — Ambulatory Visit (INDEPENDENT_AMBULATORY_CARE_PROVIDER_SITE_OTHER): Payer: Medicare Other | Admitting: Podiatry

## 2017-09-16 DIAGNOSIS — B351 Tinea unguium: Secondary | ICD-10-CM | POA: Diagnosis not present

## 2017-09-16 DIAGNOSIS — E1142 Type 2 diabetes mellitus with diabetic polyneuropathy: Secondary | ICD-10-CM | POA: Diagnosis not present

## 2017-09-16 DIAGNOSIS — M79674 Pain in right toe(s): Secondary | ICD-10-CM | POA: Diagnosis not present

## 2017-09-16 DIAGNOSIS — M79675 Pain in left toe(s): Secondary | ICD-10-CM | POA: Diagnosis not present

## 2017-09-16 DIAGNOSIS — M25473 Effusion, unspecified ankle: Secondary | ICD-10-CM

## 2017-09-16 NOTE — Progress Notes (Signed)
This patient presents to the office with chief complaint of long thick nails and diabetic feet.  This patient  says there  is   pain and discomfort in her big toes.  This patient says there are long ingrown and painful nails.  These nails are painful  wearing shoes.  Patient has no history of infection or drainage from both feet.  Patient is unable to  self treat his own nails . This patient presents  to the office today for treatment of the  long nails and a foot evaluation due to history of  diabetes.  General Appearance  Alert, conversant and in no acute stress.  Vascular  Dorsalis pedis  are palpable  bilaterally. Posterior tibial pulses are non palpable  B/L due to ankle swelling. Capillary return is within normal limits  bilaterally. Temperature is within normal limits  bilaterally.  Neurologic  Senn-Weinstein monofilament wire absent   bilaterally. Muscle power within normal limits bilaterally.  Nails Thick disfigured discolored nails with subungual debris  from hallux to fifth toes bilaterally. No evidence of bacterial infection or drainage bilaterally.  Orthopedic  No limitations of motion of motion feet .  No crepitus or effusions noted.  No bony pathology or digital deformities noted.  Skin  normotropic skin with no porokeratosis noted bilaterally.  No signs of infections or ulcers noted.     Onychomycosis  Diabetes with neuropathy.  IE  Debride nails x 10.  A diabetic foot exam was performed and there is no evidence of any vascular  pathology.   RTC 3 months.  Severe neuropathy noted.   Gardiner Barefoot DPM

## 2017-10-22 DIAGNOSIS — D692 Other nonthrombocytopenic purpura: Secondary | ICD-10-CM

## 2017-10-23 DIAGNOSIS — I10 Essential (primary) hypertension: Secondary | ICD-10-CM | POA: Diagnosis not present

## 2017-10-23 DIAGNOSIS — G309 Alzheimer's disease, unspecified: Secondary | ICD-10-CM

## 2017-10-23 DIAGNOSIS — F39 Unspecified mood [affective] disorder: Secondary | ICD-10-CM

## 2017-10-23 DIAGNOSIS — E1140 Type 2 diabetes mellitus with diabetic neuropathy, unspecified: Secondary | ICD-10-CM

## 2017-10-23 DIAGNOSIS — J45909 Unspecified asthma, uncomplicated: Secondary | ICD-10-CM

## 2017-10-23 DIAGNOSIS — K52832 Lymphocytic colitis: Secondary | ICD-10-CM

## 2017-10-23 DIAGNOSIS — E785 Hyperlipidemia, unspecified: Secondary | ICD-10-CM

## 2017-10-23 DIAGNOSIS — N3281 Overactive bladder: Secondary | ICD-10-CM

## 2017-12-16 ENCOUNTER — Ambulatory Visit: Payer: Medicare Other | Admitting: Podiatry

## 2017-12-23 DIAGNOSIS — E1142 Type 2 diabetes mellitus with diabetic polyneuropathy: Secondary | ICD-10-CM | POA: Diagnosis not present

## 2017-12-23 DIAGNOSIS — I10 Essential (primary) hypertension: Secondary | ICD-10-CM | POA: Diagnosis not present

## 2017-12-23 DIAGNOSIS — K52832 Lymphocytic colitis: Secondary | ICD-10-CM

## 2017-12-23 DIAGNOSIS — F0390 Unspecified dementia without behavioral disturbance: Secondary | ICD-10-CM

## 2017-12-23 DIAGNOSIS — F39 Unspecified mood [affective] disorder: Secondary | ICD-10-CM

## 2018-02-01 ENCOUNTER — Other Ambulatory Visit: Payer: Self-pay | Admitting: Internal Medicine

## 2018-02-03 NOTE — Telephone Encounter (Signed)
She is at [facility] and shouldn't be getting this from outside pharmacy

## 2018-02-03 NOTE — Telephone Encounter (Signed)
Oh, I didn't realize it came from [facility]----but they shouldn't have to send Rx request for this here

## 2018-02-18 DIAGNOSIS — J181 Lobar pneumonia, unspecified organism: Secondary | ICD-10-CM | POA: Diagnosis not present

## 2018-02-19 ENCOUNTER — Inpatient Hospital Stay
Admission: EM | Admit: 2018-02-19 | Discharge: 2018-02-21 | DRG: 291 | Disposition: A | Discharge status: Skilled Nursing Facility | Payer: Medicare Other | Attending: Internal Medicine | Admitting: Internal Medicine

## 2018-02-19 ENCOUNTER — Emergency Department: Payer: Medicare Other

## 2018-02-19 ENCOUNTER — Other Ambulatory Visit: Payer: Self-pay

## 2018-02-19 DIAGNOSIS — E669 Obesity, unspecified: Secondary | ICD-10-CM | POA: Diagnosis present

## 2018-02-19 DIAGNOSIS — Z87891 Personal history of nicotine dependence: Secondary | ICD-10-CM | POA: Diagnosis not present

## 2018-02-19 DIAGNOSIS — N183 Chronic kidney disease, stage 3 (moderate): Secondary | ICD-10-CM | POA: Diagnosis present

## 2018-02-19 DIAGNOSIS — Z6841 Body Mass Index (BMI) 40.0 and over, adult: Secondary | ICD-10-CM

## 2018-02-19 DIAGNOSIS — I130 Hypertensive heart and chronic kidney disease with heart failure and stage 1 through stage 4 chronic kidney disease, or unspecified chronic kidney disease: Principal | ICD-10-CM | POA: Diagnosis present

## 2018-02-19 DIAGNOSIS — N2581 Secondary hyperparathyroidism of renal origin: Secondary | ICD-10-CM | POA: Diagnosis present

## 2018-02-19 DIAGNOSIS — Z888 Allergy status to other drugs, medicaments and biological substances status: Secondary | ICD-10-CM | POA: Diagnosis not present

## 2018-02-19 DIAGNOSIS — E039 Hypothyroidism, unspecified: Secondary | ICD-10-CM | POA: Diagnosis present

## 2018-02-19 DIAGNOSIS — Z66 Do not resuscitate: Secondary | ICD-10-CM | POA: Diagnosis present

## 2018-02-19 DIAGNOSIS — I5033 Acute on chronic diastolic (congestive) heart failure: Secondary | ICD-10-CM | POA: Diagnosis present

## 2018-02-19 DIAGNOSIS — G4730 Sleep apnea, unspecified: Secondary | ICD-10-CM | POA: Diagnosis present

## 2018-02-19 DIAGNOSIS — J189 Pneumonia, unspecified organism: Secondary | ICD-10-CM | POA: Diagnosis present

## 2018-02-19 DIAGNOSIS — Z79899 Other long term (current) drug therapy: Secondary | ICD-10-CM

## 2018-02-19 DIAGNOSIS — E876 Hypokalemia: Secondary | ICD-10-CM | POA: Diagnosis present

## 2018-02-19 DIAGNOSIS — G609 Hereditary and idiopathic neuropathy, unspecified: Secondary | ICD-10-CM | POA: Diagnosis present

## 2018-02-19 DIAGNOSIS — Z7982 Long term (current) use of aspirin: Secondary | ICD-10-CM | POA: Diagnosis not present

## 2018-02-19 DIAGNOSIS — E1122 Type 2 diabetes mellitus with diabetic chronic kidney disease: Secondary | ICD-10-CM | POA: Diagnosis present

## 2018-02-19 DIAGNOSIS — R0902 Hypoxemia: Secondary | ICD-10-CM

## 2018-02-19 DIAGNOSIS — R0602 Shortness of breath: Secondary | ICD-10-CM | POA: Diagnosis not present

## 2018-02-19 LAB — BASIC METABOLIC PANEL
Anion gap: 10 (ref 5–15)
BUN: 20 mg/dL (ref 8–23)
CALCIUM: 8.7 mg/dL — AB (ref 8.9–10.3)
CHLORIDE: 99 mmol/L (ref 98–111)
CO2: 28 mmol/L (ref 22–32)
CREATININE: 1.32 mg/dL — AB (ref 0.44–1.00)
GFR calc non Af Amer: 37 mL/min — ABNORMAL LOW (ref >60)
GFR, EST AFRICAN AMERICAN: 43 mL/min — AB (ref >60)
Glucose, Bld: 252 mg/dL — ABNORMAL HIGH (ref 70–99)
Potassium: 3.3 mmol/L — ABNORMAL LOW (ref 3.5–5.1)
SODIUM: 137 mmol/L (ref 135–145)

## 2018-02-19 LAB — CBC
HEMATOCRIT: 42.7 % (ref 36.0–46.0)
Hemoglobin: 13 g/dL (ref 12.0–15.0)
MCH: 27.5 pg (ref 26.0–34.0)
MCHC: 30.4 g/dL (ref 30.0–36.0)
MCV: 90.5 fL (ref 80.0–100.0)
NRBC: 0 % (ref 0.0–0.2)
PLATELETS: 167 10*3/uL (ref 150–400)
RBC: 4.72 MIL/uL (ref 3.87–5.11)
RDW: 16 % — AB (ref 11.5–15.5)
WBC: 9.4 10*3/uL (ref 4.0–10.5)

## 2018-02-19 LAB — BRAIN NATRIURETIC PEPTIDE: B Natriuretic Peptide: 217 pg/mL — ABNORMAL HIGH (ref 0.0–100.0)

## 2018-02-19 LAB — GLUCOSE, CAPILLARY: Glucose-Capillary: 225 mg/dL — ABNORMAL HIGH (ref 70–99)

## 2018-02-19 LAB — TROPONIN I

## 2018-02-19 LAB — INFLUENZA PANEL BY PCR (TYPE A & B)
INFLAPCR: NEGATIVE
Influenza B By PCR: NEGATIVE

## 2018-02-19 LAB — LACTIC ACID, PLASMA
Lactic Acid, Venous: 2.8 mmol/L (ref 0.5–1.9)
Lactic Acid, Venous: 3.4 mmol/L (ref 0.5–1.9)

## 2018-02-19 MED ORDER — ACETAMINOPHEN 650 MG RE SUPP: 650.0000 mg | Freq: Four times a day (QID) | RECTAL | Status: DC | PRN

## 2018-02-19 MED ORDER — IMIPRAMINE HCL 25 MG PO TABS
25.0000 mg | ORAL_TABLET | Freq: Every day | ORAL | Status: DC
  Administered 2018-02-19 – 2018-02-20 (×2): 25 mg via ORAL
  Filled 2018-02-19 (×3): qty 1

## 2018-02-19 MED ORDER — SODIUM CHLORIDE 0.9 % IV BOLUS
500.0000 mL | Freq: Once | INTRAVENOUS | Status: AC
  Administered 2018-02-19: 500 mL via INTRAVENOUS

## 2018-02-19 MED ORDER — AMLODIPINE BESYLATE 5 MG PO TABS
5.0000 mg | ORAL_TABLET | Freq: Every day | ORAL | Status: DC
  Administered 2018-02-19 – 2018-02-20 (×2): 5 mg via ORAL
  Filled 2018-02-19 (×2): qty 1

## 2018-02-19 MED ORDER — VANCOMYCIN HCL 10 G IV SOLR
1250.0000 mg | Freq: Once | INTRAVENOUS | Status: AC
  Administered 2018-02-19: 1250 mg via INTRAVENOUS
  Filled 2018-02-19 (×2): qty 1250

## 2018-02-19 MED ORDER — GUAIFENESIN-DM 100-10 MG/5ML PO SYRP
10.0000 mL | ORAL_SOLUTION | ORAL | Status: DC | PRN
  Administered 2018-02-20: 10 mL via ORAL
  Filled 2018-02-19: qty 10

## 2018-02-19 MED ORDER — SODIUM CHLORIDE 0.9 % IV SOLN
2.0000 g | Freq: Two times a day (BID) | INTRAVENOUS | Status: DC
  Administered 2018-02-20 (×2): 2 g via INTRAVENOUS
  Filled 2018-02-19 (×3): qty 2

## 2018-02-19 MED ORDER — ENOXAPARIN SODIUM 40 MG/0.4ML ~~LOC~~ SOLN
40.0000 mg | Freq: Two times a day (BID) | SUBCUTANEOUS | Status: DC
  Administered 2018-02-19 – 2018-02-20 (×3): 40 mg via SUBCUTANEOUS
  Filled 2018-02-19 (×3): qty 0.4

## 2018-02-19 MED ORDER — VANCOMYCIN HCL 10 G IV SOLR: 1500.0000 mg | INTRAVENOUS | Status: DC

## 2018-02-19 MED ORDER — ALBUTEROL SULFATE (2.5 MG/3ML) 0.083% IN NEBU
5.0000 mg | INHALATION_SOLUTION | Freq: Once | RESPIRATORY_TRACT | Status: AC
  Administered 2018-02-19: 5 mg via RESPIRATORY_TRACT
  Filled 2018-02-19: qty 6

## 2018-02-19 MED ORDER — SODIUM CHLORIDE 0.9 % IV SOLN
2.0000 g | Freq: Once | INTRAVENOUS | Status: AC
  Administered 2018-02-19: 2 g via INTRAVENOUS
  Filled 2018-02-19: qty 2

## 2018-02-19 MED ORDER — SODIUM CHLORIDE 0.9% FLUSH: 3.0000 mL | INTRAVENOUS | Status: DC | PRN

## 2018-02-19 MED ORDER — SODIUM CHLORIDE 0.9 % IV SOLN: 2.0000 g | INTRAVENOUS | Status: DC

## 2018-02-19 MED ORDER — VANCOMYCIN HCL IN DEXTROSE 1-5 GM/200ML-% IV SOLN
1000.0000 mg | Freq: Once | INTRAVENOUS | Status: AC
  Administered 2018-02-19: 1000 mg via INTRAVENOUS
  Filled 2018-02-19: qty 200

## 2018-02-19 MED ORDER — SODIUM CHLORIDE 0.9 % IV SOLN: 1.0000 g | INTRAVENOUS | Status: DC

## 2018-02-19 MED ORDER — ASPIRIN 81 MG PO CHEW
81.0000 mg | CHEWABLE_TABLET | Freq: Every day | ORAL | Status: DC
  Administered 2018-02-20 – 2018-02-21 (×2): 81 mg via ORAL
  Filled 2018-02-19 (×2): qty 1

## 2018-02-19 MED ORDER — SIMVASTATIN 20 MG PO TABS
20.0000 mg | ORAL_TABLET | Freq: Every day | ORAL | Status: DC
  Administered 2018-02-19 – 2018-02-20 (×2): 20 mg via ORAL
  Filled 2018-02-19 (×3): qty 1

## 2018-02-19 MED ORDER — FUROSEMIDE 10 MG/ML IJ SOLN
40.0000 mg | Freq: Once | INTRAMUSCULAR | Status: AC
  Administered 2018-02-19: 40 mg via INTRAVENOUS
  Filled 2018-02-19: qty 4

## 2018-02-19 MED ORDER — INSULIN ASPART 100 UNIT/ML ~~LOC~~ SOLN
0.0000 [IU] | Freq: Every day | SUBCUTANEOUS | Status: DC
  Administered 2018-02-19: 2 [IU] via SUBCUTANEOUS
  Filled 2018-02-19: qty 1

## 2018-02-19 MED ORDER — BUDESONIDE 3 MG PO CPEP
3.0000 mg | ORAL_CAPSULE | Freq: Every day | ORAL | Status: DC
  Administered 2018-02-20 – 2018-02-21 (×2): 3 mg via ORAL
  Filled 2018-02-19 (×2): qty 1

## 2018-02-19 MED ORDER — HYDROCODONE-ACETAMINOPHEN 5-325 MG PO TABS: 1.0000 | ORAL_TABLET | Freq: Four times a day (QID) | ORAL | Status: DC | PRN

## 2018-02-19 MED ORDER — POTASSIUM CHLORIDE CRYS ER 20 MEQ PO TBCR
40.0000 meq | EXTENDED_RELEASE_TABLET | Freq: Once | ORAL | Status: AC
  Administered 2018-02-19: 40 meq via ORAL
  Filled 2018-02-19: qty 2

## 2018-02-19 MED ORDER — SODIUM CHLORIDE 0.9% FLUSH
3.0000 mL | Freq: Two times a day (BID) | INTRAVENOUS | Status: DC
  Administered 2018-02-19 – 2018-02-21 (×4): 3 mL via INTRAVENOUS

## 2018-02-19 MED ORDER — CHOLESTYRAMINE 4 G PO PACK
4.0000 g | PACK | Freq: Every day | ORAL | Status: DC
  Administered 2018-02-20 – 2018-02-21 (×2): 4 g via ORAL
  Filled 2018-02-19 (×3): qty 1

## 2018-02-19 MED ORDER — ONDANSETRON HCL 4 MG PO TABS: 4.0000 mg | ORAL_TABLET | Freq: Four times a day (QID) | ORAL | Status: DC | PRN

## 2018-02-19 MED ORDER — SODIUM CHLORIDE 0.9 % IV SOLN: 500.0000 mg | INTRAVENOUS | Status: DC

## 2018-02-19 MED ORDER — ALBUTEROL SULFATE (2.5 MG/3ML) 0.083% IN NEBU
2.5000 mg | INHALATION_SOLUTION | Freq: Once | RESPIRATORY_TRACT | Status: AC
  Administered 2018-02-19: 2.5 mg via RESPIRATORY_TRACT
  Filled 2018-02-19: qty 3

## 2018-02-19 MED ORDER — LORATADINE 10 MG PO TABS
10.0000 mg | ORAL_TABLET | Freq: Every evening | ORAL | Status: DC
  Administered 2018-02-19 – 2018-02-20 (×2): 10 mg via ORAL
  Filled 2018-02-19 (×2): qty 1

## 2018-02-19 MED ORDER — SODIUM CHLORIDE 0.9 % IV SOLN: 250.0000 mL | INTRAVENOUS | Status: DC | PRN

## 2018-02-19 MED ORDER — ONDANSETRON HCL 4 MG/2ML IJ SOLN: 4.0000 mg | Freq: Four times a day (QID) | INTRAMUSCULAR | Status: DC | PRN

## 2018-02-19 MED ORDER — INSULIN ASPART 100 UNIT/ML ~~LOC~~ SOLN
0.0000 [IU] | Freq: Three times a day (TID) | SUBCUTANEOUS | Status: DC
  Administered 2018-02-20: 2 [IU] via SUBCUTANEOUS
  Administered 2018-02-20: 3 [IU] via SUBCUTANEOUS
  Administered 2018-02-21 (×2): 2 [IU] via SUBCUTANEOUS
  Filled 2018-02-19 (×5): qty 1

## 2018-02-19 MED ORDER — IPRATROPIUM-ALBUTEROL 0.5-2.5 (3) MG/3ML IN SOLN
3.0000 mL | Freq: Three times a day (TID) | RESPIRATORY_TRACT | Status: DC | PRN
  Administered 2018-02-20: 3 mL via RESPIRATORY_TRACT
  Filled 2018-02-19: qty 3

## 2018-02-19 MED ORDER — METHYLPREDNISOLONE SODIUM SUCC 125 MG IJ SOLR
125.0000 mg | Freq: Once | INTRAMUSCULAR | Status: AC
  Administered 2018-02-19: 125 mg via INTRAVENOUS
  Filled 2018-02-19: qty 2

## 2018-02-19 MED ORDER — SENNOSIDES-DOCUSATE SODIUM 8.6-50 MG PO TABS: 1.0000 | ORAL_TABLET | Freq: Every evening | ORAL | Status: DC | PRN

## 2018-02-19 MED ORDER — ESCITALOPRAM OXALATE 10 MG PO TABS
10.0000 mg | ORAL_TABLET | Freq: Every day | ORAL | Status: DC
  Administered 2018-02-20 – 2018-02-21 (×2): 10 mg via ORAL
  Filled 2018-02-19 (×2): qty 1

## 2018-02-19 MED ORDER — ACETAMINOPHEN 325 MG PO TABS: 650.0000 mg | ORAL_TABLET | Freq: Four times a day (QID) | ORAL | Status: DC | PRN

## 2018-02-19 NOTE — Progress Notes (Signed)
Pharmacy Antibiotic Note  Deborah [name] is a 85 y.o. female admitted on 02/19/2018 with pneumonia.  Pharmacy has been consulted for vancomycin and cefepime dosing.  Plan: Vancomycin 1000 mg x 1 ordered by EDP. Will order an additional 1250 mg IV x 1 for a total loading dose of 2250 mg. Vancomycin 1500 mg IV Q 36 hrs to start 1/10 at 0900. Goal AUC 400-550. Expected AUC: 451 SCr used: 1.32   Height: 5' 8"  (172.7 cm) Weight: (!) 320 lb (145.2 kg) IBW/kg (Calculated) : 63.9  Temp (24hrs), Avg:98.2 F (36.8 C), Min:98.2 F (36.8 C), Max:98.2 F (36.8 C)  Recent Labs  Lab 02/19/18 1650 02/19/18 1741  WBC 9.4  --   CREATININE 1.32*  --   LATICACIDVEN  --  2.8*    Estimated Creatinine Clearance: 47.4 mL/min (A) (by C-G formula based on SCr of 1.32 mg/dL (H)).    Allergies  Allergen Reactions  . Ace Inhibitors Cough    Antimicrobials this admission: Vancomycin 1/8 >>  Cefepime 1/8 >>   Dose adjustments this admission: NA  Microbiology results: 1/8 MRSA PCR: pending  Thank you for allowing pharmacy to be a part of this patient's care.  Tawnya Crook, PharmD Pharmacy Resident  02/19/2018 7:31 PM

## 2018-02-19 NOTE — Progress Notes (Signed)
Advanced care plan.  Purpose of the Encounter: CODE STATUS  Parties in Attendance: Patient and family  Patient's Decision Capacity: Good  Subjective/Patient's story: Presented to emergency room for shortness of breath and cough and congestion   Objective/Medical story Patient has low oxygen saturation and hypoxia Has pneumonia needs IV antibiotics   Goals of care determination:  Advance care directives goals of care and treatment plan discussed Patient does not want any CPR, intubation and ventilator if the need arises   CODE STATUS: DNR   Time spent discussing advanced care planning: 16 minutes

## 2018-02-19 NOTE — ED Notes (Signed)
Family at bedside. 

## 2018-02-19 NOTE — ED Notes (Signed)
Pt oxygen remains upper 80's on 2 L, increased to 3 L at this time. Pt does NOT wear oxygen at baseline.

## 2018-02-19 NOTE — H&P (Addendum)
[facility] [place] NAME: Deborah [name]    MR#:  [id number]  DATE OF BIRTH:  Nov 04, 1930  DATE OF ADMISSION:  02/19/2018  PRIMARY CARE PHYSICIAN: Venia Carbon, MD   REQUESTING/REFERRING PHYSICIAN:   CHIEF COMPLAINT:   Chief Complaint  Patient presents with  . Shortness of Breath    HISTORY OF PRESENT ILLNESS: Deborah [name]  is a 85 y.o. female with a known history of hypertension, hyperlipidemia, diabetes mellitus type 2, chronic kidney disease stage III, osteoarthritis, obesity, hypothyroidism, secondary hyperparathyroidism, sleep apnea presented to the emergency room from [facility] facility.  Patient has cough, congestion.  Cough is productive of phlegm.  Her oxygen saturation was also low when she presented to the emergency room.  She was put on oxygen via nasal cannula.  Chest x-ray revealed pneumonia and lactic acid was mildly elevated.  Patient was started on IV antibiotics.  Hospitalist service was consulted for further care  PAST MEDICAL HISTORY:   Past Medical History:  Diagnosis Date  . 'light-for-dates' infant with signs of fetal malnutrition   . Anxiety   . Chronic kidney disease, stage III (moderate) (HCC)   . Contracture of palmar fascia   . Cough   . Diabetes mellitus 2019   all diabetic meds stopped upon arrival to [facility]  . Hyperlipidemia   . Hypertension   . Lower extremity edema   . Obesity   . Osteoarthritis   . Other specified acquired hypothyroidism   . Otitis externa   . Palpitations   . Peripheral neuropathy   . Pure hypercholesterolemia   . Secondary hyperparathyroidism (of renal origin)   . Sleep apnea   . Ulcerative colitis ([place])   . Unspecified adjustment reaction   . Unspecified hereditary and idiopathic peripheral neuropathy   . Unspecified urinary incontinence     PAST SURGICAL HISTORY:  Past Surgical History:  Procedure Laterality Date  . BACK SURGERY  1990   Ruptured Disc.  Marland Kitchen  CARDIAC CATHETERIZATION  03/2008   due to chest pain  . COLONOSCOPY    . COLONOSCOPY WITH PROPOFOL N/A 10/02/2016   Procedure: COLONOSCOPY WITH PROPOFOL;  Surgeon: Lucilla Lame, MD;  Location: [facility] ENDOSCOPY;  Service: Endoscopy;  Laterality: N/A;  . DUPUYTREN CONTRACTURE RELEASE Left 05/27/2017   Procedure: DUPUYTREN CONTRACTURE RELEASE-LEFT INDEX AND PINKY FINGER;  Surgeon: Leanor Kail, MD;  Location: [facility] ORS;  Service: Orthopedics;  Laterality: Left;  . EYE SURGERY Bilateral 2014   cataract extractions with IOL implant  . HAND SURGERY Right 1999   dupeytren release  . REPLACEMENT TOTAL KNEE Bilateral 2004, 1999  . SHOULDER SURGERY Right 06/2007   rotator cuff repair    SOCIAL HISTORY:  Social History   Tobacco Use  . Smoking status: Former Smoker    Packs/day: 1.00    Years: 10.00    Pack years: 10.00    Types: Cigarettes    Last attempt to quit: 12/27/1997    Years since quitting: 20.1  . Smokeless tobacco: Never Used  Substance Use Topics  . Alcohol use: No    FAMILY HISTORY:  Family History  Problem Relation Age of Onset  . Heart attack Father   . Hypertension Sister   . Hypertension Sister   . Alzheimer's disease Mother     DRUG ALLERGIES:  Allergies  Allergen Reactions  . Ace Inhibitors Cough    REVIEW OF SYSTEMS:   CONSTITUTIONAL: No fever, has fatigue and weakness.  EYES: No blurred or double vision.  EARS, NOSE, AND THROAT: No tinnitus or ear pain.  RESPIRATORY: Has cough, shortness of breath,  No wheezing or hemoptysis.  CARDIOVASCULAR: No chest pain, orthopnea, edema.  GASTROINTESTINAL: No nausea, vomiting, diarrhea or abdominal pain.  GENITOURINARY: No dysuria, hematuria.  ENDOCRINE: No polyuria, nocturia,  HEMATOLOGY: No anemia, easy bruising or bleeding SKIN: No rash or lesion. MUSCULOSKELETAL: No joint pain or arthritis.   NEUROLOGIC: No tingling, numbness, weakness.  PSYCHIATRY: No anxiety or depression.   MEDICATIONS AT HOME:   Prior to Admission medications   Medication Sig Start Date End Date Taking? Authorizing Provider  acetaminophen (TYLENOL) 500 MG tablet Take 500 mg by mouth every 6 (six) hours as needed for mild pain.     [provider]  amLODipine (NORVASC) 5 MG tablet Take 5 mg by mouth at bedtime.    [provider]  aspirin 81 MG chewable tablet Chew 81 mg by mouth daily.    [provider]  budesonide (ENTOCORT EC) 3 MG 24 hr capsule Take by mouth daily.    [provider]  cholestyramine Lucrezia Starch) 4 GM/DOSE powder Take 4 g by mouth daily.     [provider]  dextromethorphan-guaiFENesin (TUSSIN DM) 10-100 MG/5ML liquid Take 10 mLs by mouth every 4 (four) hours as needed for cough.    [provider]  escitalopram (LEXAPRO) 10 MG tablet Take 10 mg by mouth daily.    [provider]  furosemide (LASIX) 20 MG tablet Take 20 mg by mouth daily as needed for fluid or edema.     [provider]  imipramine (TOFRANIL) 25 MG tablet Take 25 mg by mouth at bedtime.    [provider]  ipratropium-albuterol (DUONEB) 0.5-2.5 (3) MG/3ML SOLN Take 3 mLs by nebulization every 8 (eight) hours as needed (shortness of breath).    [provider]  loperamide (IMODIUM A-D) 2 MG tablet Take 1 tablet (2 mg total) by mouth 4 (four) times daily as needed for diarrhea or loose stools. 10/16/16   Lucilla Lame, MD  loratadine (CLARITIN) 10 MG tablet Take 10 mg by mouth every evening.    [provider]  LORazepam (ATIVAN) 0.5 MG tablet Take by mouth. 09/11/13   [provider]  NORCO 5-325 MG tablet Take 1-2 tablets by mouth every 6 (six) hours as needed for up to 15 doses for moderate pain. MAXIMUM TOTAL ACETAMINOPHEN DOSE IS 4000 MG PER DAY 05/27/17   Leanor Kail, MD  nystatin (MYCOSTATIN/NYSTOP) powder Apply topically under pannus and underbilateral breast twice daily as needed for yeast rash    [provider]   simvastatin (ZOCOR) 20 MG tablet Take 20 mg by mouth at bedtime.  07/18/15   Margarita Rana, MD      PHYSICAL EXAMINATION:   VITAL SIGNS: Blood pressure (!) 154/71, pulse 94, temperature 98.2 F (36.8 C), resp. rate (!) 21, height 5' 8"  (1.727 m), weight (!) 145.2 kg, SpO2 92 %.  GENERAL:  85 y.o.-year-old patient lying in the bed on oxygen via nasal cannula EYES: Pupils equal, round, reactive to light and accommodation. No scleral icterus. Extraocular muscles intact.  HEENT: Head atraumatic, normocephalic. Oropharynx and nasopharynx clear.  NECK:  Supple, no jugular venous distention. No thyroid enlargement, no tenderness.  LUNGS: Decreased breath sounds bilaterally, bilateral Rales heard. No use of accessory muscles of respiration.  CARDIOVASCULAR: S1, S2 normal. No murmurs, rubs, or gallops.  ABDOMEN: Soft, nontender, nondistended. Bowel sounds present. No  organomegaly or mass.  EXTREMITIES: has pedal edema,  No cyanosis, or clubbing.  NEUROLOGIC: Cranial nerves II through XII are intact. Muscle strength 5/5 in all extremities. Sensation intact. Gait not checked.  PSYCHIATRIC: The patient is alert and oriented x 3.  SKIN: No obvious rash, lesion, or ulcer.   LABORATORY PANEL:   CBC Recent Labs  Lab 02/19/18 1650  WBC 9.4  HGB 13.0  HCT 42.7  PLT 167  MCV 90.5  MCH 27.5  MCHC 30.4  RDW 16.0*   ------------------------------------------------------------------------------------------------------------------  Chemistries  Recent Labs  Lab 02/19/18 1650  NA 137  K 3.3*  CL 99  CO2 28  GLUCOSE 252*  BUN 20  CREATININE 1.32*  CALCIUM 8.7*   ------------------------------------------------------------------------------------------------------------------ estimated creatinine clearance is 47.4 mL/min (A) (by C-G formula based on SCr of 1.32 mg/dL  (H)). ------------------------------------------------------------------------------------------------------------------ No results for input(s): TSH, T4TOTAL, T3FREE, THYROIDAB in the last 72 hours.  Invalid input(s): FREET3   Coagulation profile No results for input(s): INR, PROTIME in the last 168 hours. ------------------------------------------------------------------------------------------------------------------- No results for input(s): DDIMER in the last 72 hours. -------------------------------------------------------------------------------------------------------------------  Cardiac Enzymes Recent Labs  Lab 02/19/18 1650  TROPONINI <0.03   ------------------------------------------------------------------------------------------------------------------ Invalid input(s): POCBNP  ---------------------------------------------------------------------------------------------------------------  Urinalysis    Component Value Date/Time   COLORURINE YELLOW (A) 02/24/2016 1808   APPEARANCEUR HAZY (A) 02/24/2016 1808   LABSPEC 1.017 02/24/2016 1808   PHURINE 5.0 02/24/2016 1808   GLUCOSEU NEGATIVE 02/24/2016 1808   HGBUR SMALL (A) 02/24/2016 1808   BILIRUBINUR NEGATIVE 02/24/2016 1808   BILIRUBINUR negative 12/31/2014 1720   KETONESUR 20 (A) 02/24/2016 1808   PROTEINUR 30 (A) 02/24/2016 1808   UROBILINOGEN 0.2 12/31/2014 1720   NITRITE POSITIVE (A) 02/24/2016 1808   LEUKOCYTESUR NEGATIVE 02/24/2016 1808     RADIOLOGY: Dg Chest Portable 1 View  Result Date: 02/19/2018 CLINICAL DATA:  Short of breath EXAM: PORTABLE CHEST 1 VIEW COMPARISON:  02/24/2016 FINDINGS: Cardiomegaly with vascular congestion and mild diffuse interstitial edema. No pleural effusion. Aortic atherosclerosis. No pneumothorax. IMPRESSION: Cardiomegaly with vascular congestion and mild interstitial edema Electronically Signed   By: Donavan Foil M.D.   On: 02/19/2018 18:41    EKG: Orders placed or  performed during the hospital encounter of 02/19/18  . ED EKG within 10 minutes  . ED EKG within 10 minutes  . EKG 12-Lead  . EKG 12-Lead    IMPRESSION AND PLAN:  85 year old elderly female patient withhistory of hypertension, hyperlipidemia, diabetes mellitus type 2, chronic kidney disease stage III, osteoarthritis, obesity, hypothyroidism, secondary hyperparathyroidism, sleep apnea presented to the emergency room from [facility] facility for shortness of breath, cough  -Healthcare associated pneumonia Admit patient to medical floor Start patient on IV vancomycin and cefepime antibiotics Follow-up cultures  -Acute hypoxia secondary to healthcare associated pneumonia Oxygen via nasal cannula  -Chronic kidney disease stage III Monitor renal function  -Elevated lactic acid Could be secondary to pneumonia versus dehydration Follow-up lactic acid level  -DVT prophylaxis subcu Lovenox daily  -Type 2 diabetes mellitus Diabetic diet with sliding scale coverage with insulin  -Acute hypokalemia Replace potassium orally  All the records are reviewed and case discussed with ED provider. Management plans discussed with the patient, family and they are in agreement.  CODE STATUS:DNR Code Status History    Date Active Date Inactive Code Status Order ID Comments User Context   02/28/2016 1151 03/02/2016 1855 DNR [id number]  Basilio Cairo, NP Inpatient   02/25/2016 0005 02/28/2016 1151 Full Code [id number]  Lance Coon, MD Inpatient    Questions for Most Recent Historical Code Status (Order [id number])    Question Answer Comment   In the event of cardiac or respiratory ARREST Do not call a "code blue"    In the event of cardiac or respiratory ARREST Do not perform Intubation, CPR, defibrillation or ACLS    In the event of cardiac or respiratory ARREST Use medication by any route, position, wound care, and other measures to relive pain and suffering. May use oxygen, suction and manual  treatment of airway obstruction as needed for comfort.         Advance Directive Documentation     Most Recent Value  Type of Advance Directive  Out of facility DNR (pink MOST or yellow form), Healthcare Power of Attorney  Pre-existing out of facility DNR order (yellow form or pink MOST form)  Yellow form placed in chart (order not valid for inpatient use)  "MOST" Form in Place?  -       TOTAL TIME TAKING CARE OF THIS PATIENT: 54 minutes.    Saundra Shelling M.D on 02/19/2018 at 7:28 PM  Between 7am to 6pm - Pager - [phone]  After 6pm go to [email or web address] - password [email or web address] [place] [facility]  Office  [phone]  CC: Primary care physician; Venia Carbon, MD

## 2018-02-19 NOTE — Progress Notes (Signed)
Anticoagulation monitoring(Lovenox):   85 yo female ordered Lovenox 40 mg Q24h  Filed Weights   02/19/18 1644  Weight: (!) 320 lb (145.2 kg)   BMI 48.66   Lab Results  Component Value Date   CREATININE 1.32 (H) 02/19/2018   CREATININE 1.32 (H) 05/17/2017   CREATININE 0.90 03/02/2016   Estimated Creatinine Clearance: 47.4 mL/min (A) (by C-G formula based on SCr of 1.32 mg/dL (H)). Hemoglobin & Hematocrit     Component Value Date/Time   HGB 13.0 02/19/2018 1650   HGB 12.0 01/19/2016 1634   HCT 42.7 02/19/2018 1650   HCT 37.4 01/19/2016 1634      Per Protocol for Patient with estCrcl > 30 ml/min and BMI > 40, will transition to Lovenox 40 mg Q12h.

## 2018-02-19 NOTE — ED Notes (Signed)
Date and time results received: 02/19/18 1834   Test: lactic Critical Value: 2.8  Name of Provider Notified: Siadecki  Orders Received? Or Actions Taken?: See mar/orders

## 2018-02-19 NOTE — ED Notes (Signed)
ED TO INPATIENT HANDOFF REPORT  Name/Age/Gender Deborah [name] 85 y.o. female  Code Status Code Status History    Date Active Date Inactive Code Status Order ID Comments User Context   02/28/2016 1151 03/02/2016 1855 DNR [id number]  Basilio Cairo, NP Inpatient   02/25/2016 0005 02/28/2016 1151 Full Code [id number]  Lance Coon, MD Inpatient    Questions for Most Recent Historical Code Status (Order [id number])    Question Answer Comment   In the event of cardiac or respiratory ARREST Do not call a "code blue"    In the event of cardiac or respiratory ARREST Do not perform Intubation, CPR, defibrillation or ACLS    In the event of cardiac or respiratory ARREST Use medication by any route, position, wound care, and other measures to relive pain and suffering. May use oxygen, suction and manual treatment of airway obstruction as needed for comfort.         Advance Directive Documentation     Most Recent Value  Type of Advance Directive  Out of facility DNR (pink MOST or yellow form), Healthcare Power of Attorney  Pre-existing out of facility DNR order (yellow form or pink MOST form)  Yellow form placed in chart (order not valid for inpatient use)  "MOST" Form in Place?  -      Home/SNF/Other Nursing Home  Chief Complaint SOB  Level of Care/Admitting Diagnosis ED Disposition    ED Disposition Condition [place]: [place] [100120]  Level of Care: Med-Surg [16]  Diagnosis: Pneumonia [227785]  Admitting Physician: Saundra Shelling [[id number]]  Attending Physician: Saundra Shelling [[id number]]  Estimated length of stay: past midnight tomorrow  Certification:: I certify this patient will need inpatient services for at least 2 midnights  PT Class (Do Not Modify): Inpatient [101]  PT Acc Code (Do Not Modify): Private [1]       Medical History Past Medical History:  Diagnosis Date  . 'light-for-dates' infant with signs of fetal malnutrition    . Anxiety   . Chronic kidney disease, stage III (moderate) (HCC)   . Contracture of palmar fascia   . Cough   . Diabetes mellitus 2019   all diabetic meds stopped upon arrival to [facility]  . Hyperlipidemia   . Hypertension   . Lower extremity edema   . Obesity   . Osteoarthritis   . Other specified acquired hypothyroidism   . Otitis externa   . Palpitations   . Peripheral neuropathy   . Pure hypercholesterolemia   . Secondary hyperparathyroidism (of renal origin)   . Sleep apnea   . Ulcerative colitis ([place])   . Unspecified adjustment reaction   . Unspecified hereditary and idiopathic peripheral neuropathy   . Unspecified urinary incontinence     Allergies Allergies  Allergen Reactions  . Ace Inhibitors Cough    IV Location/Drains/Wounds Patient Lines/Drains/Airways Status   Active Line/Drains/Airways    Name:   Placement date:   Placement time:   Site:   Days:   Peripheral IV 02/19/18 Left Forearm   02/19/18    1750    Forearm   less than 1   Incision (Closed) 05/27/17 Hand Left   05/27/17    0713     268          Labs/Imaging Results for orders placed or performed during the hospital encounter of 02/19/18 (from the past 48 hour(s))  Basic metabolic panel     Status: Abnormal  Collection Time: 02/19/18  4:50 PM  Result Value Ref Range   Sodium 137 135 - 145 mmol/L   Potassium 3.3 (L) 3.5 - 5.1 mmol/L   Chloride 99 98 - 111 mmol/L   CO2 28 22 - 32 mmol/L   Glucose, Bld 252 (H) 70 - 99 mg/dL   BUN 20 8 - 23 mg/dL   Creatinine, Ser 1.32 (H) 0.44 - 1.00 mg/dL   Calcium 8.7 (L) 8.9 - 10.3 mg/dL   GFR calc non Af Amer 37 (L) >60 mL/min   GFR calc Af Amer 43 (L) >60 mL/min   Anion gap 10 5 - 15    Comment: Performed at [facility], [address] [place], [place] [address]  CBC     Status: Abnormal   Collection Time: 02/19/18  4:50 PM  Result Value Ref Range   WBC 9.4 4.0 - 10.5 K/uL   RBC 4.72 3.87 - 5.11 MIL/uL   Hemoglobin 13.0 12.0 - 15.0  g/dL   HCT 42.7 36.0 - 46.0 %   MCV 90.5 80.0 - 100.0 fL   MCH 27.5 26.0 - 34.0 pg   MCHC 30.4 30.0 - 36.0 g/dL   RDW 16.0 (H) 11.5 - 15.5 %   Platelets 167 150 - 400 K/uL   nRBC 0.0 0.0 - 0.2 %    Comment: Performed at [facility], [address] [place], [place] [address]  Troponin I - ONCE - STAT     Status: None   Collection Time: 02/19/18  4:50 PM  Result Value Ref Range   Troponin I <0.03 <0.03 ng/mL    Comment: Performed at [facility], [address] [place], [place] [address]  Brain natriuretic peptide     Status: Abnormal   Collection Time: 02/19/18  4:50 PM  Result Value Ref Range   B Natriuretic Peptide 217.0 (H) 0.0 - 100.0 pg/mL    Comment: Performed at [facility], [address] [place], [place] [address]  Influenza panel by PCR (type A & B)     Status: None   Collection Time: 02/19/18  5:23 PM  Result Value Ref Range   Influenza A By PCR NEGATIVE NEGATIVE   Influenza B By PCR NEGATIVE NEGATIVE    Comment: (NOTE) The Xpert Xpress Flu assay is intended as an aid in the diagnosis of  influenza and should not be used as a sole basis for treatment.  This  assay is FDA approved for nasopharyngeal swab specimens only. Nasal  washings and aspirates are unacceptable for Xpert Xpress Flu testing. Performed at [facility], [address] [place], [place] [address]   Lactic acid, plasma     Status: Abnormal   Collection Time: 02/19/18  5:41 PM  Result Value Ref Range   Lactic Acid, Venous 2.8 (HH) 0.5 - 1.9 mmol/L    Comment: CRITICAL RESULT CALLED TO, READ BACK BY AND VERIFIED WITH JESSICA COLTRANE 02/19/18 1826 KLW Performed at [facility], [address] [place], [place] [address]    Dg Chest Portable 1 View  Result Date: 02/19/2018 CLINICAL DATA:  Short of breath EXAM: PORTABLE CHEST 1 VIEW COMPARISON:  02/24/2016 FINDINGS: Cardiomegaly with vascular congestion and mild diffuse interstitial edema. No pleural  effusion. Aortic atherosclerosis. No pneumothorax. IMPRESSION: Cardiomegaly with vascular congestion and mild interstitial edema Electronically Signed   By: Donavan Foil M.D.   On: 02/19/2018 18:41    Pending Labs Unresulted Labs (From admission, onward)  Start     Ordered   02/19/18 1930  MRSA PCR Screening  ONCE - STAT,   STAT     02/19/18 1929   02/19/18 1859  Lactic acid, plasma  ONCE - STAT,   STAT     02/19/18 1858   Signed and Held  CBC  (enoxaparin (LOVENOX)    CrCl >/= 30 ml/min)  Once,   R    Comments:  Baseline for enoxaparin therapy IF NOT ALREADY DRAWN.  Notify MD if PLT < 100 K.    Signed and Held   Signed and Held  Creatinine, serum  (enoxaparin (LOVENOX)    CrCl >/= 30 ml/min)  Once,   R    Comments:  Baseline for enoxaparin therapy IF NOT ALREADY DRAWN.    Signed and Held   Signed and Held  Creatinine, serum  (enoxaparin (LOVENOX)    CrCl >/= 30 ml/min)  Weekly,   R    Comments:  while on enoxaparin therapy    Signed and Held   Signed and Held  Basic metabolic panel  Tomorrow morning,   R     Signed and Held   Signed and Held  CBC  Tomorrow morning,   R     Signed and Held          Vitals/Pain Today's Vitals   02/19/18 1641 02/19/18 1644 02/19/18 1646 02/19/18 1850  BP: (!) 154/71  (!) 154/71   Pulse:  93 95 94  Resp:   (!) 23 (!) 21  Temp:   98.2 F (36.8 C)   SpO2:  (!) 88% 92% 92%  Weight:  (!) 145.2 kg    Height:  5' 8"  (1.727 m)    PainSc:  0-No pain      Isolation Precautions No active isolations  Medications Medications  vancomycin (VANCOCIN) IVPB 1000 mg/200 mL premix (1,000 mg Intravenous New Bag/Given 02/19/18 2011)  vancomycin (VANCOCIN) 1,250 mg in sodium chloride 0.9 % 250 mL IVPB (has no administration in time range)  ceFEPIme (MAXIPIME) 2 g in sodium chloride 0.9 % 100 mL IVPB (has no administration in time range)  vancomycin (VANCOCIN) 1,500 mg in sodium chloride 0.9 % 500 mL IVPB (has no administration in time range)  insulin  aspart (novoLOG) injection 0-15 Units (has no administration in time range)  insulin aspart (novoLOG) injection 0-5 Units (has no administration in time range)  albuterol (PROVENTIL) (2.5 MG/3ML) 0.083% nebulizer solution 5 mg (5 mg Nebulization Given 02/19/18 1652)  albuterol (PROVENTIL) (2.5 MG/3ML) 0.083% nebulizer solution 2.5 mg (2.5 mg Nebulization Given 02/19/18 1723)  albuterol (PROVENTIL) (2.5 MG/3ML) 0.083% nebulizer solution 2.5 mg (2.5 mg Nebulization Given 02/19/18 1722)  methylPREDNISolone sodium succinate (SOLU-MEDROL) 125 mg/2 mL injection 125 mg (125 mg Intravenous Given 02/19/18 1722)  sodium chloride 0.9 % bolus 500 mL (500 mLs Intravenous Bolus 02/19/18 1850)  ceFEPIme (MAXIPIME) 2 g in sodium chloride 0.9 % 100 mL IVPB (0 g Intravenous Stopped 02/19/18 2011)  potassium chloride SA (K-DUR,KLOR-CON) CR tablet 40 mEq (40 mEq Oral Given 02/19/18 1950)  furosemide (LASIX) injection 40 mg (40 mg Intravenous Given 02/19/18 1950)    Mobility walks with device

## 2018-02-19 NOTE — ED Provider Notes (Signed)
[facility] Emergency Department Provider Note ____________________________________________   None    (approximate)  I have reviewed the triage vital signs and the nursing notes.   HISTORY  Chief Complaint Shortness of Breath    HPI Deborah [name] is a 85 y.o. female with PMH as noted below who presents with worsening shortness of breath over the last week, associated with cough productive of yellowish sputum, not associated with fever.  Patient denies any chest pain or vomiting.  Per EMS, chest x-ray was performed yesterday at her facility which showed pneumonia.  The patient is also newly hypoxic.  She is not normally on oxygen.  Past Medical History:  Diagnosis Date  . 'light-for-dates' infant with signs of fetal malnutrition   . Anxiety   . Chronic kidney disease, stage III (moderate) (HCC)   . Contracture of palmar fascia   . Cough   . Diabetes mellitus 2019   all diabetic meds stopped upon arrival to [facility]  . Hyperlipidemia   . Hypertension   . Lower extremity edema   . Obesity   . Osteoarthritis   . Other specified acquired hypothyroidism   . Otitis externa   . Palpitations   . Peripheral neuropathy   . Pure hypercholesterolemia   . Secondary hyperparathyroidism (of renal origin)   . Sleep apnea   . Ulcerative colitis ([place])   . Unspecified adjustment reaction   . Unspecified hereditary and idiopathic peripheral neuropathy   . Unspecified urinary incontinence     Patient Active Problem List   Diagnosis Date Noted  . Pneumonia 02/19/2018  . Benign neoplasm of cecum   . Benign neoplasm of transverse colon   . Lewy body dementia without behavioral disturbance ([place])   . Palliative care by specialist   . DNR (do not resuscitate)   . Goals of care, counseling/discussion   . Sepsis ([place]) 02/24/2016  . Urinary tract infection without hematuria 02/24/2016  . Memory loss 07/18/2015  . Depression 01/12/2015  . Shoulder pain  12/17/2014  . Pain in shoulder 08/05/2014  . Gonalgia 07/21/2014  . History of knee surgery 07/21/2014  . Allergic rhinitis 07/16/2014  . Anxiety 07/16/2014  . Benign hypertension 07/16/2014  . Alteration in bowel elimination: incontinence 07/16/2014  . Chronic kidney disease (CKD), stage III (moderate) ([place]) 07/16/2014  . Colitis 07/16/2014  . CN (constipation) 07/16/2014  . Diarrhea 07/16/2014  . Contracture of palmar fascia (Dupuytren's) 07/16/2014  . Hypercholesteremia 07/16/2014  . Hypoxia 07/16/2014  . Absence of bladder continence 07/16/2014  . Focal lymphocytic colitis 07/16/2014  . Extreme obesity 07/16/2014  . Arthritis, degenerative 07/16/2014  . Disorder of peripheral nervous system 07/16/2014  . Neuralgia neuritis, sciatic nerve 07/16/2014  . Hyperparathyroidism, secondary ([place]) 07/16/2014  . Apnea, sleep 07/16/2014  . Subclinical hypothyroidism 07/16/2014  . Controlled type 2 diabetes mellitus ([place]) 07/16/2014  . 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 Urinary incontinence without sensory awareness 09/03/2012  . Palpitations 12/28/2010  . SOB (shortness of breath) 12/28/2010  . Overweight 08/02/2009    Past Surgical History:  Procedure Laterality Date  . BACK SURGERY  1990   Ruptured Disc.  Marland Kitchen CARDIAC CATHETERIZATION  03/2008   due to chest pain  . COLONOSCOPY    . COLONOSCOPY WITH PROPOFOL N/A 10/02/2016   Procedure: COLONOSCOPY WITH PROPOFOL;  Surgeon: Lucilla Lame, MD;  Location: [facility] ENDOSCOPY;  Service: Endoscopy;  Laterality: N/A;  . DUPUYTREN CONTRACTURE RELEASE Left 05/27/2017   Procedure: DUPUYTREN CONTRACTURE RELEASE-LEFT INDEX AND PINKY  FINGER;  Surgeon: Leanor Kail, MD;  Location: [facility] ORS;  Service: Orthopedics;  Laterality: Left;  . EYE SURGERY Bilateral 2014   cataract extractions with IOL implant  . HAND SURGERY Right 1999   dupeytren release  . REPLACEMENT TOTAL KNEE Bilateral 2004, 1999  . SHOULDER SURGERY Right 06/2007   rotator cuff  repair    Prior to Admission medications   Medication Sig Start Date End Date Taking? Authorizing Provider  acetaminophen (TYLENOL) 500 MG tablet Take 500 mg by mouth every 6 (six) hours as needed for mild pain.     [provider]  amLODipine (NORVASC) 5 MG tablet Take 5 mg by mouth at bedtime.    [provider]  aspirin 81 MG chewable tablet Chew 81 mg by mouth daily.    [provider]  budesonide (ENTOCORT EC) 3 MG 24 hr capsule Take by mouth daily.    [provider]  cholestyramine Lucrezia Starch) 4 GM/DOSE powder Take 4 g by mouth daily.     [provider]  dextromethorphan-guaiFENesin (TUSSIN DM) 10-100 MG/5ML liquid Take 10 mLs by mouth every 4 (four) hours as needed for cough.    [provider]  escitalopram (LEXAPRO) 10 MG tablet Take 10 mg by mouth daily.    [provider]  furosemide (LASIX) 20 MG tablet Take 20 mg by mouth daily as needed for fluid or edema.     [provider]  imipramine (TOFRANIL) 25 MG tablet Take 25 mg by mouth at bedtime.    [provider]  ipratropium-albuterol (DUONEB) 0.5-2.5 (3) MG/3ML SOLN Take 3 mLs by nebulization every 8 (eight) hours as needed (shortness of breath).    [provider]  loperamide (IMODIUM A-D) 2 MG tablet Take 1 tablet (2 mg total) by mouth 4 (four) times daily as needed for diarrhea or loose stools. 10/16/16   Lucilla Lame, MD  loratadine (CLARITIN) 10 MG tablet Take 10 mg by mouth every evening.    [provider]  LORazepam (ATIVAN) 0.5 MG tablet Take by mouth. 09/11/13   [provider]  NORCO 5-325 MG tablet Take 1-2 tablets by mouth every 6 (six) hours as needed for up to 15 doses for moderate pain. MAXIMUM TOTAL ACETAMINOPHEN DOSE IS 4000 MG PER DAY 05/27/17   Leanor Kail, MD  nystatin (MYCOSTATIN/NYSTOP) powder Apply topically under pannus and underbilateral breast twice daily as needed for yeast rash    [provider]  simvastatin (ZOCOR) 20 MG tablet Take 20 mg by mouth at bedtime.  07/18/15   Margarita Rana, MD    Allergies Ace inhibitors  Family History  Problem Relation Age of Onset  . Heart attack Father   . Hypertension Sister   . Hypertension Sister   . Alzheimer's disease Mother     Social History Social History   Tobacco Use  . Smoking status: Former Smoker    Packs/day: 1.00    Years: 10.00    Pack years: 10.00    Types: Cigarettes    Last attempt to quit: 12/27/1997    Years since quitting: 20.1  . Smokeless tobacco: Never Used  Substance Use Topics  . Alcohol use: No  . Drug use: No    Review of Systems  Constitutional: No fever. Eyes: No redness. ENT: No sore throat. Cardiovascular: Denies chest pain. Respiratory: Positive for shortness of breath. Gastrointestinal: No vomiting or diarrhea.  Genitourinary: Negative for dysuria.  Musculoskeletal: Negative for back pain. Skin: Negative for  rash. Neurological: Negative for headache.  ____________________________________________   PHYSICAL EXAM:  VITAL SIGNS: ED Triage Vitals  Enc Vitals Group     BP 02/19/18 1646 (!) 154/71     Pulse Rate 02/19/18 1646 91     Resp 02/19/18 1646 (!) 24     Temp 02/19/18 1646 98.2 F (36.8 C)     Temp src --      SpO2 02/19/18 1646 92 %     Weight 02/19/18 1644 (!) 320 lb (145.2 kg)     Height 02/19/18 1644 5' 8"  (1.727 m)     Head Circumference --      Peak Flow --      Pain Score 02/19/18 1644 0     Pain Loc --      Pain Edu? --      Excl. in [place]? --     Constitutional: Alert and oriented.  Slightly uncomfortable appearing but in no acute distress. Eyes: Conjunctivae are normal.  Head: Atraumatic. Nose: No congestion/rhinnorhea. Mouth/Throat: Mucous membranes are moist.   Neck: Normal range of motion.  Cardiovascular: Normal rate, regular rhythm. Grossly normal heart sounds.  Good peripheral circulation. Respiratory: Slightly increased respiratory  effort.  No retractions.  Diffuse wheezing and rhonchi bilaterally. Gastrointestinal: Soft and nontender. No distention.  Genitourinary: No flank tenderness. Musculoskeletal: 2+ bilateral lower extremity edema.  Extremities warm and well perfused.  Neurologic:  Normal speech and language. No gross focal neurologic deficits are appreciated.  Skin:  Skin is warm and dry. No rash noted. Psychiatric: Mood and affect are normal. Speech and behavior are normal.  ____________________________________________   LABS (all labs ordered are listed, but only abnormal results are displayed)  Labs Reviewed  BASIC METABOLIC PANEL - Abnormal; Notable for the following components:      Result Value   Potassium 3.3 (*)    Glucose, Bld 252 (*)    Creatinine, Ser 1.32 (*)    Calcium 8.7 (*)    GFR calc non Af Amer 37 (*)    GFR calc Af Amer 43 (*)    All other components within normal limits  CBC - Abnormal; Notable for the following components:   RDW 16.0 (*)    All other components within normal limits  LACTIC ACID, PLASMA - Abnormal; Notable for the following components:   Lactic Acid, Venous 2.8 (*)    All other components within normal limits  BRAIN NATRIURETIC PEPTIDE - Abnormal; Notable for the following components:   B Natriuretic Peptide 217.0 (*)    All other components within normal limits  MRSA PCR SCREENING  TROPONIN I  INFLUENZA PANEL BY PCR (TYPE A & B)  LACTIC ACID, PLASMA   ____________________________________________  EKG  ED ECG REPORT I, Arta Silence, the attending physician, personally viewed and interpreted this ECG.  Date: 02/19/2018 EKG Time: 1646 Rate: 91 Rhythm: normal sinus rhythm QRS Axis: normal Intervals: Possible borderline IVCD ST/T Wave abnormalities: normal Narrative Interpretation: no evidence of acute ischemia; no significant change when compared to EKG of 05/17/2017  ____________________________________________  RADIOLOGY  CXR: Cardiomegaly  with vascular congestion  ____________________________________________   PROCEDURES  Procedure(s) performed: No  Procedures  Critical Care performed: No ____________________________________________   INITIAL IMPRESSION / ASSESSMENT AND PLAN / ED COURSE  Pertinent labs & imaging results that were available during my care of the patient were reviewed by me and considered in my medical decision making (see chart for details).  86 year old female with PMH as noted above presents  with worsening shortness of breath and cough over the last week.  Per EMS history the patient had an x-ray at her facility showing pneumonia.  It is not clear from the information given whether the patient had treatment initiated.  She is also newly hypoxic.  On exam the patient is alert and notably comfortable appearing.  She has slightly increased respiratory effort but no acute distress.  She has diffuse wheezing bilaterally.  She has significant peripheral edema but the patient states is unchanged from her baseline.  The remainder of the exam is as described above.  Differential includes pneumonia, acute bronchitis, influenza, or pulmonary edema.  We will obtain an x-ray, lab work-up, give bronchodilators and steroid, and reassess.  Given that the patient is hypoxic to the high 80s on room air she will require admission.  ----------------------------------------- 7:39 PM on 02/19/2018 -----------------------------------------  Chest x-ray shows cardiomegaly with vascular congestion rather than focal lobar pneumonia.  However, the patient has no history of CHF, and given the patient's cough and wheezing, the presentation is still more consistent with pneumonia or bronchitis (although this still could be new onset CHF).  I will treat her empirically for HCAP.  I have given fluids due to the elevated lactate.  I signed the patient out to the hospitalist Dr.  Estanislado Pandy. ____________________________________________   FINAL CLINICAL IMPRESSION(S) / ED DIAGNOSES  Final diagnoses:  Shortness of breath      NEW MEDICATIONS STARTED DURING THIS VISIT:  New Prescriptions   No medications on file     Note:  This document was prepared using Dragon voice recognition software and may include unintentional dictation errors.   Arta Silence, MD 02/19/18 1940

## 2018-02-19 NOTE — ED Triage Notes (Signed)
Pt comes from [facility] with SOB. Pt reports CXR was taken yesterday showing possible pneumonia. Pt has audible wheezing and tightness but able to talk in sentences. Pt had 1 duoneb and 1 albuterol tx with EMS. 86% on RA. Placed on 2L.

## 2018-02-20 ENCOUNTER — Inpatient Hospital Stay (HOSPITAL_COMMUNITY)
Admit: 2018-02-20 | Discharge: 2018-02-20 | Disposition: A | Discharge status: Home / Self Care | Payer: Medicare Other | Attending: Internal Medicine | Admitting: Internal Medicine

## 2018-02-20 ENCOUNTER — Inpatient Hospital Stay: Payer: Medicare Other

## 2018-02-20 DIAGNOSIS — R0602 Shortness of breath: Secondary | ICD-10-CM

## 2018-02-20 LAB — GLUCOSE, CAPILLARY
Glucose-Capillary: 170 mg/dL — ABNORMAL HIGH (ref 70–99)
Glucose-Capillary: 140 mg/dL — ABNORMAL HIGH (ref 70–99)
Glucose-Capillary: 146 mg/dL — ABNORMAL HIGH (ref 70–99)
Glucose-Capillary: 127 mg/dL — ABNORMAL HIGH (ref 70–99)

## 2018-02-20 LAB — CBC
HCT: 42.3 % (ref 36.0–46.0)
Hemoglobin: 12.9 g/dL (ref 12.0–15.0)
MCH: 27.7 pg (ref 26.0–34.0)
MCHC: 30.5 g/dL (ref 30.0–36.0)
MCV: 91 fL (ref 80.0–100.0)
Platelets: 158 10*3/uL (ref 150–400)
RBC: 4.65 MIL/uL (ref 3.87–5.11)
RDW: 15.9 % — ABNORMAL HIGH (ref 11.5–15.5)
WBC: 9.1 10*3/uL (ref 4.0–10.5)
nRBC: 0 % (ref 0.0–0.2)

## 2018-02-20 LAB — BASIC METABOLIC PANEL
Anion gap: 6 (ref 5–15)
BUN: 22 mg/dL (ref 8–23)
CO2: 32 mmol/L (ref 22–32)
CREATININE: 1.25 mg/dL — AB (ref 0.44–1.00)
Calcium: 8.7 mg/dL — ABNORMAL LOW (ref 8.9–10.3)
Chloride: 102 mmol/L (ref 98–111)
GFR calc Af Amer: 45 mL/min — ABNORMAL LOW (ref >60)
GFR calc non Af Amer: 39 mL/min — ABNORMAL LOW (ref >60)
Glucose, Bld: 209 mg/dL — ABNORMAL HIGH (ref 70–99)
Potassium: 4.2 mmol/L (ref 3.5–5.1)
Sodium: 140 mmol/L (ref 135–145)

## 2018-02-20 LAB — MRSA PCR SCREENING: MRSA by PCR: NEGATIVE

## 2018-02-20 LAB — PROCALCITONIN: Procalcitonin: <0.1 ng/mL

## 2018-02-20 LAB — LACTIC ACID, PLASMA: Lactic Acid, Venous: 2.1 mmol/L (ref 0.5–1.9)

## 2018-02-20 MED ORDER — FUROSEMIDE 10 MG/ML IJ SOLN
20.0000 mg | Freq: Two times a day (BID) | INTRAMUSCULAR | Status: DC
  Administered 2018-02-20 – 2018-02-21 (×2): 20 mg via INTRAVENOUS
  Filled 2018-02-20 (×2): qty 4

## 2018-02-20 NOTE — Progress Notes (Signed)
*  PRELIMINARY RESULTS* Echocardiogram 2D Echocardiogram has been performed.  Deborah [name] 02/20/2018, 2:45 PM

## 2018-02-21 LAB — ECHOCARDIOGRAM COMPLETE
Height: 68 in
WEIGHTICAEL: 5120 [oz_av]

## 2018-02-21 LAB — CBC
HEMATOCRIT: 41.1 % (ref 36.0–46.0)
Hemoglobin: 12.7 g/dL (ref 12.0–15.0)
MCH: 27.6 pg (ref 26.0–34.0)
MCHC: 30.9 g/dL (ref 30.0–36.0)
MCV: 89.3 fL (ref 80.0–100.0)
Platelets: 146 10*3/uL — ABNORMAL LOW (ref 150–400)
RBC: 4.6 MIL/uL (ref 3.87–5.11)
RDW: 15.9 % — ABNORMAL HIGH (ref 11.5–15.5)
WBC: 8.2 10*3/uL (ref 4.0–10.5)
nRBC: 0 % (ref 0.0–0.2)

## 2018-02-21 LAB — BASIC METABOLIC PANEL
Anion gap: 8 (ref 5–15)
BUN: 26 mg/dL — ABNORMAL HIGH (ref 8–23)
CO2: 32 mmol/L (ref 22–32)
CREATININE: 1.15 mg/dL — AB (ref 0.44–1.00)
Calcium: 8.5 mg/dL — ABNORMAL LOW (ref 8.9–10.3)
Chloride: 100 mmol/L (ref 98–111)
GFR calc non Af Amer: 43 mL/min — ABNORMAL LOW (ref >60)
GFR, EST AFRICAN AMERICAN: 50 mL/min — AB (ref >60)
Glucose, Bld: 130 mg/dL — ABNORMAL HIGH (ref 70–99)
Potassium: 3.1 mmol/L — ABNORMAL LOW (ref 3.5–5.1)
Sodium: 140 mmol/L (ref 135–145)

## 2018-02-21 LAB — GLUCOSE, CAPILLARY
Glucose-Capillary: 126 mg/dL — ABNORMAL HIGH (ref 70–99)
Glucose-Capillary: 142 mg/dL — ABNORMAL HIGH (ref 70–99)

## 2018-02-21 LAB — PROCALCITONIN

## 2018-02-21 MED ORDER — POTASSIUM CHLORIDE CRYS ER 20 MEQ PO TBCR
40.0000 meq | EXTENDED_RELEASE_TABLET | Freq: Two times a day (BID) | ORAL | Status: DC
  Administered 2018-02-21: 40 meq via ORAL
  Filled 2018-02-21: qty 2

## 2018-02-21 MED ORDER — POTASSIUM CHLORIDE ER 10 MEQ PO TBCR: 10.0000 meq | EXTENDED_RELEASE_TABLET | Freq: Every day | ORAL | 0 refills | Status: DC

## 2018-02-21 MED ORDER — CARVEDILOL 3.125 MG PO TABS: 3.1250 mg | ORAL_TABLET | Freq: Two times a day (BID) | ORAL | 11 refills | Status: AC

## 2018-02-21 NOTE — Progress Notes (Signed)
Report called to [facility]. Waiting for [facility] to arrive for transport.

## 2018-02-21 NOTE — Discharge Summary (Signed)
[place] [facility] [place] NAME: Deborah [name]    MR#:  [id number]  DATE OF BIRTH:  08-Feb-1931  DATE OF ADMISSION:  02/19/2018 ADMITTING PHYSICIAN: Saundra Shelling, MD  DATE OF DISCHARGE: 02/21/2018   PRIMARY CARE PHYSICIAN: Venia Carbon, MD    ADMISSION DIAGNOSIS:  Shortness of breath [R06.02]  DISCHARGE DIAGNOSIS:  Ac on [place] diastolic CHF  SECONDARY DIAGNOSIS:   Past Medical History:  Diagnosis Date  . 'light-for-dates' infant with signs of fetal malnutrition   . Anxiety   . Chronic kidney disease, stage III (moderate) (HCC)   . Contracture of palmar fascia   . Cough   . Diabetes mellitus 2019   all diabetic meds stopped upon arrival to [facility]  . Hyperlipidemia   . Hypertension   . Lower extremity edema   . Obesity   . Osteoarthritis   . Other specified acquired hypothyroidism   . Otitis externa   . Palpitations   . Peripheral neuropathy   . Pure hypercholesterolemia   . Secondary hyperparathyroidism (of renal origin)   . Sleep apnea   . Ulcerative colitis ([place])   . Unspecified adjustment reaction   . Unspecified hereditary and idiopathic peripheral neuropathy   . Unspecified urinary incontinence     HOSPITAL COURSE:   85 year old elderly female patient withhistory ofhypertension, hyperlipidemia, diabetes mellitus type 2, chronic kidney disease stage III, osteoarthritis, obesity, hypothyroidism, secondary hyperparathyroidism, sleep apnea presented to the emergency room from [facility] facility for shortness of breath, cough  -Healthcare associated pneumonia- this was suspected on admission, but I feel it is more of CHF. Pneumonia ruled out.   on IV vancomycin and cefepime antibiotics Follow-up cultures, MRSA negative, stopped vanc, stop Abx. WBCs are not high, procalcitonin is not high.  -Acute hypoxia secondary to Ac on Ch  diastolic CHF Oxygen via nasal cannula IV lasix, reviewed Echo. I/O monitor. Had  > 2 ltr negative balance COunseled about fluid restriction and refer to Cardio clinic.  -Chronic kidney disease stage III Monitor renal function  -Elevated lactic acid Could be secondary to pneumonia versus dehydration Follow-up lactic acid level- coming down.  -DVT prophylaxis subcu Lovenox daily  -Type 2 diabetes mellitus Diabetic diet with sliding scale coverage with insulin  -Acute hypokalemia Replace potassium orally  - Htn   Added coreg to home regimen due to LVH.  DISCHARGE CONDITIONS:   Stable.  CONSULTS OBTAINED:    DRUG ALLERGIES:   Allergies  Allergen Reactions  . Ace Inhibitors Cough    DISCHARGE MEDICATIONS:   Allergies as of 02/21/2018      Reactions   Ace Inhibitors Cough      Medication List    STOP taking these medications   azithromycin 250 MG tablet Commonly known as:  ZITHROMAX   cefTRIAXone 1 g injection Commonly known as:  ROCEPHIN   predniSONE 20 MG tablet Commonly known as:  DELTASONE     TAKE these medications   acetaminophen 500 MG tablet Commonly known as:  TYLENOL Take 500 mg by mouth every 6 (six) hours as needed for mild pain.   amLODipine 5 MG tablet Commonly known as:  NORVASC Take 5 mg by mouth at bedtime.   aspirin 81 MG chewable tablet Chew 81 mg by mouth daily.   budesonide 3 MG 24 hr capsule Commonly known as:  ENTOCORT EC Take by mouth daily.   carvedilol 3.125 MG tablet Commonly known as:  COREG Take 1 tablet (3.125  mg total) by mouth 2 (two) times daily.   cholestyramine 4 GM/DOSE powder Commonly known as:  QUESTRAN Take 4 g by mouth daily.   Cranberry 425 MG Caps Take 425 mg by mouth daily at 6 (six) AM.   escitalopram 10 MG tablet Commonly known as:  LEXAPRO Take 10 mg by mouth daily.   furosemide 40 MG tablet Commonly known as:  LASIX Take 40 mg by mouth daily as needed for fluid or edema.   imipramine 25 MG tablet Commonly known as:  TOFRANIL Take 25 mg by mouth at bedtime.    ipratropium-albuterol 0.5-2.5 (3) MG/3ML Soln Commonly known as:  DUONEB Take 3 mLs by nebulization every 8 (eight) hours as needed (shortness of breath).   loperamide 2 MG tablet Commonly known as:  IMODIUM A-D Take 1 tablet (2 mg total) by mouth 4 (four) times daily as needed for diarrhea or loose stools.   loratadine 10 MG tablet Commonly known as:  CLARITIN Take 10 mg by mouth every evening.   LORazepam 0.5 MG tablet Commonly known as:  ATIVAN Take by mouth.   NORCO 5-325 MG tablet Generic drug:  HYDROcodone-acetaminophen Take 1-2 tablets by mouth every 6 (six) hours as needed for up to 15 doses for moderate pain. MAXIMUM TOTAL ACETAMINOPHEN DOSE IS 4000 MG PER DAY   nystatin powder Commonly known as:  MYCOSTATIN/NYSTOP Apply topically under pannus and underbilateral breast twice daily as needed for yeast rash   potassium chloride 10 MEQ tablet Commonly known as:  K-DUR Take 1 tablet (10 mEq total) by mouth daily.   simvastatin 20 MG tablet Commonly known as:  ZOCOR Take 20 mg by mouth at bedtime.   TUSSIN DM 10-100 MG/5ML liquid Generic drug:  dextromethorphan-guaiFENesin Take 10 mLs by mouth every 4 (four) hours as needed for cough.        DISCHARGE INSTRUCTIONS:    Follow with cardio clinic in 1-2 weeks, Fluid restrictions daily < 1.5 ltr.  If you experience worsening of your admission symptoms, develop shortness of breath, life threatening emergency, suicidal or homicidal thoughts you must seek medical attention immediately by calling 911 or calling your MD immediately  if symptoms less severe.  You Must read complete instructions/literature along with all the possible adverse reactions/side effects for all the Medicines you take and that have been prescribed to you. Take any new Medicines after you have completely understood and accept all the possible adverse reactions/side effects.   Please note  You were cared for by a hospitalist during your hospital  stay. If you have any questions about your discharge medications or the care you received while you were in the hospital after you are discharged, you can call the unit and asked to speak with the hospitalist on call if the hospitalist that took care of you is not available. Once you are discharged, your primary care physician will handle any further medical issues. Please note that NO REFILLS for any discharge medications will be authorized once you are discharged, as it is imperative that you return to your primary care physician (or establish a relationship with a primary care physician if you do not have one) for your aftercare needs so that they can reassess your need for medications and monitor your lab values.    Today   CHIEF COMPLAINT:   Chief Complaint  Patient presents with  . Shortness of Breath    HISTORY OF PRESENT ILLNESS:  Deborah [name]  is a 85 y.o. female with  a known history of hypertension, hyperlipidemia, diabetes mellitus type 2, chronic kidney disease stage III, osteoarthritis, obesity, hypothyroidism, secondary hyperparathyroidism, sleep apnea presented to the emergency room from [facility] facility.  Patient has cough, congestion.  Cough is productive of phlegm.  Her oxygen saturation was also low when she presented to the emergency room.  She was put on oxygen via nasal cannula.  Chest x-ray revealed pneumonia and lactic acid was mildly elevated.  Patient was started on IV antibiotics.  Hospitalist service was consulted for further care   VITAL SIGNS:  Blood pressure (!) 156/69, pulse 77, temperature 98.1 F (36.7 C), temperature source Oral, resp. rate 14, height 5' 8"  (1.727 m), weight (!) 145.2 kg, SpO2 92 %.  I/O:    Intake/Output Summary (Last 24 hours) at 02/21/2018 1303 Last data filed at 02/21/2018 0415 Gross per 24 hour  Intake 100 ml  Output 1675 ml  Net -1575 ml    PHYSICAL EXAMINATION:   GENERAL:  85 y.o.-year-old patient lying in the bed with no  acute distress.  EYES: Pupils equal, round, reactive to light and accommodation. No scleral icterus. Extraocular muscles intact.  HEENT: Head atraumatic, normocephalic. Oropharynx and nasopharynx clear.  NECK:  Supple, no jugular venous distention. No thyroid enlargement, no tenderness.  LUNGS: Normal breath sounds bilaterally, no wheezing, some crepitation. No use of accessory muscles of respiration.  CARDIOVASCULAR: S1, S2 normal. No murmurs, rubs, or gallops.  ABDOMEN: Soft, nontender, nondistended. Bowel sounds present. No organomegaly or mass.  EXTREMITIES: b/l pedal edema,no cyanosis, or clubbing.  NEUROLOGIC: Cranial nerves II through XII are intact. Muscle strength 4/5 in all extremities. Sensation intact. Gait not checked.  PSYCHIATRIC: The patient is alert and oriented x 3.  SKIN: No obvious rash, lesion, or ulcer.   DATA REVIEW:   CBC Recent Labs  Lab 02/21/18 0536  WBC 8.2  HGB 12.7  HCT 41.1  PLT 146*    Chemistries  Recent Labs  Lab 02/21/18 0536  NA 140  K 3.1*  CL 100  CO2 32  GLUCOSE 130*  BUN 26*  CREATININE 1.15*  CALCIUM 8.5*    Cardiac Enzymes Recent Labs  Lab 02/19/18 1650  TROPONINI <0.03    Microbiology Results  Results for orders placed or performed during the hospital encounter of 02/19/18  MRSA PCR Screening     Status: None   Collection Time: 02/19/18  7:30 PM  Result Value Ref Range Status   MRSA by PCR NEGATIVE NEGATIVE Final    Comment:        The GeneXpert MRSA Assay (FDA approved for NASAL specimens only), is one component of a comprehensive MRSA colonization surveillance program. It is not intended to diagnose MRSA infection nor to guide or monitor treatment for MRSA infections. Performed at [facility], [address] [place], [place] [address]     RADIOLOGY:  Dg Chest 2 View  Result Date: 02/20/2018 CLINICAL DATA:  Hypoxia. EXAM: CHEST - 2 VIEW COMPARISON:  02/19/1998.  Chest x-ray 02/24/2016. FINDINGS:  Cardiomegaly. No pulmonary venous congestion noted on today's exam. Persistent mild pulmonary interstitial edema may be present. Mild bibasilar atelectasis/infiltrates. Small left pleural effusion. No pneumothorax. IMPRESSION: Cardiomegaly. No pulmonary venous congestion noted on today's exam. Persistent mild pulmonary interstitial edema may be present. Mild bibasilar atelectasis. Small left pleural effusion. Electronically Signed   By: Marcello Moores  Register   On: 02/20/2018 12:26    EKG:   Orders placed or performed during the hospital encounter of 02/19/18  .  ED EKG within 10 minutes  . ED EKG within 10 minutes  . EKG 12-Lead  . EKG 12-Lead      Management plans discussed with the patient, family and they are in agreement.  CODE STATUS:     Code Status Orders  (From admission, onward)         Start     Ordered   02/19/18 2131  Do not attempt resuscitation (DNR)  Continuous    Question Answer Comment  In the event of cardiac or respiratory ARREST Do not call a "code blue"   In the event of cardiac or respiratory ARREST Do not perform Intubation, CPR, defibrillation or ACLS   In the event of cardiac or respiratory ARREST Use medication by any route, position, wound care, and other measures to relive pain and suffering. May use oxygen, suction and manual treatment of airway obstruction as needed for comfort.      02/19/18 2130        Code Status History    Date Active Date Inactive Code Status Order ID Comments User Context   02/28/2016 1151 03/02/2016 1855 DNR [id number]  Basilio Cairo, NP Inpatient   02/25/2016 0005 02/28/2016 1151 Full Code [id number]  Lance Coon, MD Inpatient    Advance Directive Documentation     Most Recent Value  Type of Advance Directive  Out of facility DNR (pink MOST or yellow form)  Pre-existing out of facility DNR order (yellow form or pink MOST form)  Yellow form placed in chart (order not valid for inpatient use)  "MOST" Form in Place?  -       TOTAL TIME TAKING CARE OF THIS PATIENT: 35 minutes.  Husband in room discussed with both of them,  Vaughan Basta M.D on 02/21/2018 at 1:03 PM  Between 7am to 6pm - Pager - [phone]  After 6pm go to [email or web address] - password [email or web address] [place] [facility]  Office  [phone]  CC: Primary care physician; Venia Carbon, MD   Note: This dictation was prepared with Dragon dictation along with smaller phrase technology. Any transcriptional errors that result from this process are unintentional.

## 2018-02-21 NOTE — Progress Notes (Signed)
[place] [facility] [place] NAME: Deborah [name]    MR#:  [id number]  DATE OF BIRTH:  April 29, 1930  SUBJECTIVE:  CHIEF COMPLAINT:   Chief Complaint  Patient presents with  . Shortness of Breath    Came with SOB, Suspected to have HCAP from NH facility. Feels slight better already.  REVIEW OF SYSTEMS:  CONSTITUTIONAL: No fever, fatigue or weakness.  EYES: No blurred or double vision.  EARS, NOSE, AND THROAT: No tinnitus or ear pain.  RESPIRATORY: have cough, shortness of breath, no wheezing or hemoptysis.  CARDIOVASCULAR: No chest pain, orthopnea, edema.  GASTROINTESTINAL: No nausea, vomiting, diarrhea or abdominal pain.  GENITOURINARY: No dysuria, hematuria.  ENDOCRINE: No polyuria, nocturia,  HEMATOLOGY: No anemia, easy bruising or bleeding SKIN: No rash or lesion. MUSCULOSKELETAL: No joint pain or arthritis.   NEUROLOGIC: No tingling, numbness, weakness.  PSYCHIATRY: No anxiety or depression.   ROS  DRUG ALLERGIES:   Allergies  Allergen Reactions  . Ace Inhibitors Cough    VITALS:  Blood pressure (!) 140/48, pulse 80, temperature 98.7 F (37.1 C), temperature source Oral, resp. rate 20, height 5' 8"  (1.727 m), weight (!) 145.2 kg, SpO2 93 %.  PHYSICAL EXAMINATION:  GENERAL:  85 y.o.-year-old patient lying in the bed with no acute distress.  EYES: Pupils equal, round, reactive to light and accommodation. No scleral icterus. Extraocular muscles intact.  HEENT: Head atraumatic, normocephalic. Oropharynx and nasopharynx clear.  NECK:  Supple, no jugular venous distention. No thyroid enlargement, no tenderness.  LUNGS: Normal breath sounds bilaterally, no wheezing, some crepitation. No use of accessory muscles of respiration.  CARDIOVASCULAR: S1, S2 normal. No murmurs, rubs, or gallops.  ABDOMEN: Soft, nontender, nondistended. Bowel sounds present. No organomegaly or mass.  EXTREMITIES: b/l pedal edema,no cyanosis, or clubbing.   NEUROLOGIC: Cranial nerves II through XII are intact. Muscle strength 4/5 in all extremities. Sensation intact. Gait not checked.  PSYCHIATRIC: The patient is alert and oriented x 3.  SKIN: No obvious rash, lesion, or ulcer.   Physical Exam LABORATORY PANEL:   CBC Recent Labs  Lab 02/21/18 0536  WBC 8.2  HGB 12.7  HCT 41.1  PLT 146*   ------------------------------------------------------------------------------------------------------------------  Chemistries  Recent Labs  Lab 02/21/18 0536  NA 140  K 3.1*  CL 100  CO2 32  GLUCOSE 130*  BUN 26*  CREATININE 1.15*  CALCIUM 8.5*   ------------------------------------------------------------------------------------------------------------------  Cardiac Enzymes Recent Labs  Lab 02/19/18 1650  TROPONINI <0.03   ------------------------------------------------------------------------------------------------------------------  RADIOLOGY:  Dg Chest 2 View  Result Date: 02/20/2018 CLINICAL DATA:  Hypoxia. EXAM: CHEST - 2 VIEW COMPARISON:  02/19/1998.  Chest x-ray 02/24/2016. FINDINGS: Cardiomegaly. No pulmonary venous congestion noted on today's exam. Persistent mild pulmonary interstitial edema may be present. Mild bibasilar atelectasis/infiltrates. Small left pleural effusion. No pneumothorax. IMPRESSION: Cardiomegaly. No pulmonary venous congestion noted on today's exam. Persistent mild pulmonary interstitial edema may be present. Mild bibasilar atelectasis. Small left pleural effusion. Electronically Signed   By: Marcello Moores  Register   On: 02/20/2018 12:26    ASSESSMENT AND PLAN:   Active Problems:   Pneumonia  85 year old elderly female patient withhistory of hypertension, hyperlipidemia, diabetes mellitus type 2, chronic kidney disease stage III, osteoarthritis, obesity, hypothyroidism, secondary hyperparathyroidism, sleep apnea presented to the emergency room from [facility] facility for shortness of breath,  cough  -Healthcare associated pneumonia- this was suspected on admission, but I feel it is more of CHF.   on IV vancomycin and cefepime  antibiotics Follow-up cultures, MRSA negative, stopped vanc, will stop Abx soon- if afebrile. WBCs are not high, procalcitonin is not high.  -Acute hypoxia secondary to Ac diastolic CHF Oxygen via nasal cannula IV lasix, get Echo. I/O monitor.  -Chronic kidney disease stage III Monitor renal function  -Elevated lactic acid Could be secondary to pneumonia versus dehydration Follow-up lactic acid level- coming down.  -DVT prophylaxis subcu Lovenox daily  -Type 2 diabetes mellitus Diabetic diet with sliding scale coverage with insulin  -Acute hypokalemia Replace potassium orally    All the records are reviewed and case discussed with Care Management/Social Workerr. Management plans discussed with the patient, family and they are in agreement.  CODE STATUS: DNR  TOTAL TIME TAKING CARE OF THIS PATIENT: 35 minutes.     POSSIBLE D/C IN 1-2 DAYS, DEPENDING ON CLINICAL CONDITION.   Vaughan Basta M.D on 02/21/2018   Between 7am to 6pm - Pager - [phone]  After 6pm go to [email or web address] - password [email or web address] [place] [facility]  Office  [phone]  CC: Primary care physician; Venia Carbon, MD  Note: This dictation was prepared with Dragon dictation along with smaller phrase technology. Any transcriptional errors that result from this process are unintentional.

## 2018-02-21 NOTE — Clinical Social Work Note (Signed)
Clinical Social Work Assessment  Patient Details  Name: Deborah [name] MRN: [id number] Date of Birth: 18-Oct-1930  Date of referral:  02/21/18               Reason for consult:  Discharge Planning                Permission sought to share information with:  Facility Sport and exercise psychologist, Family Supports Permission granted to share information::  Yes, Verbal Permission Granted  Name::        Agency::     Relationship::     Contact Information:     Housing/Transportation Living arrangements for the past 2 months:  [place] of Information:  Patient, Spouse Patient Interpreter Needed:  None Criminal Activity/Legal Involvement Pertinent to Current Situation/Hospitalization:  No - Comment as needed Significant Relationships:  Spouse Lives with:  Facility Resident Do you feel safe going back to the place where you live?  Yes Need for family participation in patient care:  Yes (Comment)  Care giving concerns:  Patient resides as a long term resident at [facility].    Social Worker assessment / plan:  Physician is discharging patient today to return to [facility]. Seth Bake at [facility] is aware and discharge information sent to her. Patient and husband are aware and request transport via [facility]. After coordinating with Seth Bake, she has made arrangements for their Lucianne Lei to transport.  Employment status:    Insurance information:    PT Recommendations:    Information / Referral to community resources:     Patient/Family's Response to care:  Patient and husband expressed appreciation for CSW assistance.  Patient/Family's Understanding of and Emotional Response to Diagnosis, Current Treatment, and Prognosis:  Patient was happy to be returning to where she considers home.   Emotional Assessment Appearance:  Appears younger than stated age Attitude/Demeanor/Rapport:  (pleasant and cooperative) Affect (typically observed):  Accepting, Calm Orientation:  Oriented  to Self, Oriented to Place, Oriented to  Time, Oriented to Situation Alcohol / Substance use:  Not Applicable Psych involvement (Current and /or in the community):  No (Comment)  Discharge Needs  Concerns to be addressed:  Care Coordination Readmission within the last 30 days:  No Current discharge risk:  None Barriers to Discharge:  No Barriers Identified   Shela Leff, LCSW 02/21/2018, 2:20 PM

## 2018-02-21 NOTE — Care Management Important Message (Signed)
Copy of signed Medicare IM left with patient in room. 

## 2018-02-21 NOTE — Discharge Instructions (Signed)
Total oral liquid intake should be < 1.5 ltrs

## 2018-02-24 DIAGNOSIS — I5032 Chronic diastolic (congestive) heart failure: Secondary | ICD-10-CM

## 2018-02-28 DIAGNOSIS — K52832 Lymphocytic colitis: Secondary | ICD-10-CM

## 2018-02-28 DIAGNOSIS — N3281 Overactive bladder: Secondary | ICD-10-CM

## 2018-02-28 DIAGNOSIS — F39 Unspecified mood [affective] disorder: Secondary | ICD-10-CM

## 2018-02-28 DIAGNOSIS — G309 Alzheimer's disease, unspecified: Secondary | ICD-10-CM

## 2018-02-28 DIAGNOSIS — J189 Pneumonia, unspecified organism: Secondary | ICD-10-CM

## 2018-02-28 DIAGNOSIS — I10 Essential (primary) hypertension: Secondary | ICD-10-CM

## 2018-02-28 DIAGNOSIS — E1140 Type 2 diabetes mellitus with diabetic neuropathy, unspecified: Secondary | ICD-10-CM

## 2018-03-11 DIAGNOSIS — R05 Cough: Secondary | ICD-10-CM

## 2018-05-01 DIAGNOSIS — F0390 Unspecified dementia without behavioral disturbance: Secondary | ICD-10-CM

## 2018-05-01 DIAGNOSIS — N390 Urinary tract infection, site not specified: Secondary | ICD-10-CM

## 2018-05-01 DIAGNOSIS — F39 Unspecified mood [affective] disorder: Secondary | ICD-10-CM

## 2018-05-01 DIAGNOSIS — E1142 Type 2 diabetes mellitus with diabetic polyneuropathy: Secondary | ICD-10-CM

## 2018-05-01 DIAGNOSIS — I5032 Chronic diastolic (congestive) heart failure: Secondary | ICD-10-CM

## 2018-05-12 DIAGNOSIS — I872 Venous insufficiency (chronic) (peripheral): Secondary | ICD-10-CM

## 2018-05-29 DIAGNOSIS — I5032 Chronic diastolic (congestive) heart failure: Secondary | ICD-10-CM

## 2018-05-30 DIAGNOSIS — R0602 Shortness of breath: Secondary | ICD-10-CM | POA: Diagnosis not present

## 2018-05-30 DIAGNOSIS — R05 Cough: Secondary | ICD-10-CM

## 2018-05-30 DIAGNOSIS — I5020 Unspecified systolic (congestive) heart failure: Secondary | ICD-10-CM

## 2018-06-05 DIAGNOSIS — M79674 Pain in right toe(s): Secondary | ICD-10-CM

## 2018-06-06 DIAGNOSIS — B351 Tinea unguium: Secondary | ICD-10-CM | POA: Diagnosis not present

## 2018-06-09 ENCOUNTER — Ambulatory Visit: Payer: Self-pay | Admitting: Urology

## 2018-06-20 IMAGING — CR DG KNEE COMPLETE 4+V*R*
4 series · 4 of 4 positions shown · non-contrast
Comparison: Right knee radiographs performed 02/19/2006

CLINICAL DATA: Status post fall, with right lateral knee pain.
Initial encounter.

EXAM:
RIGHT KNEE - COMPLETE 4+ VIEW

[knee ap]
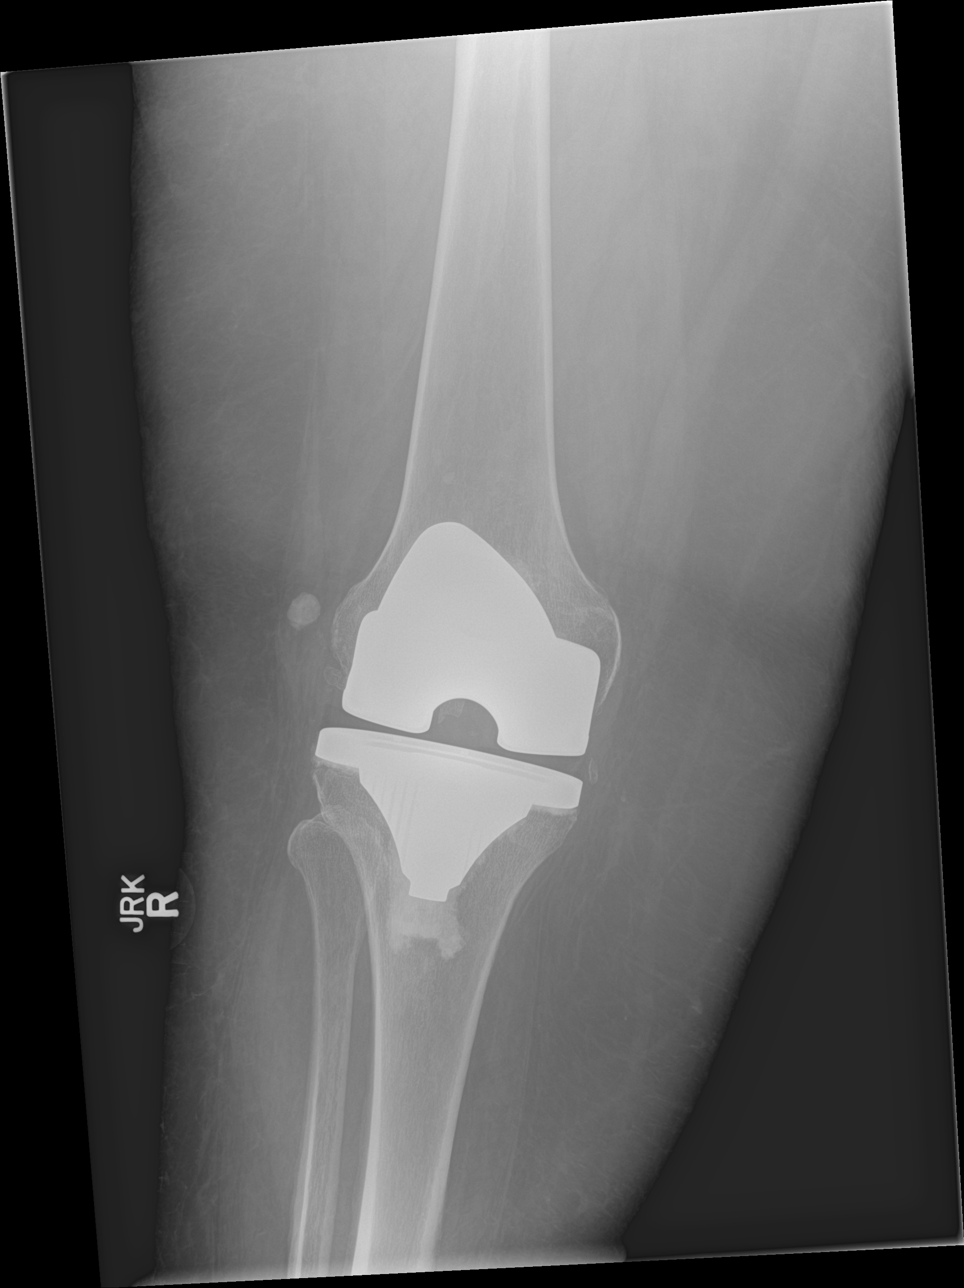

[knee obl (1 of 2)]
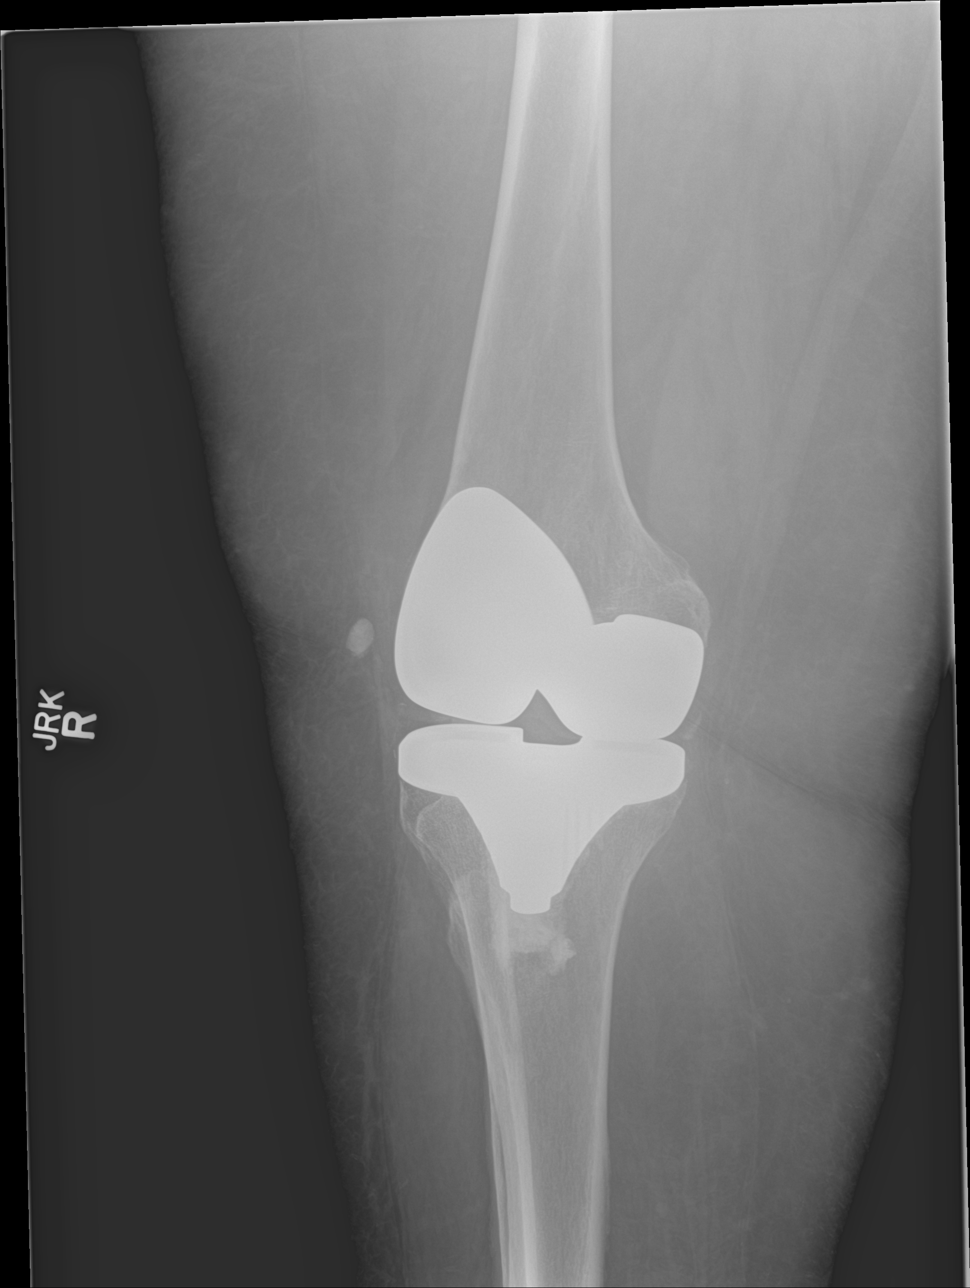

[knee obl (2 of 2)]
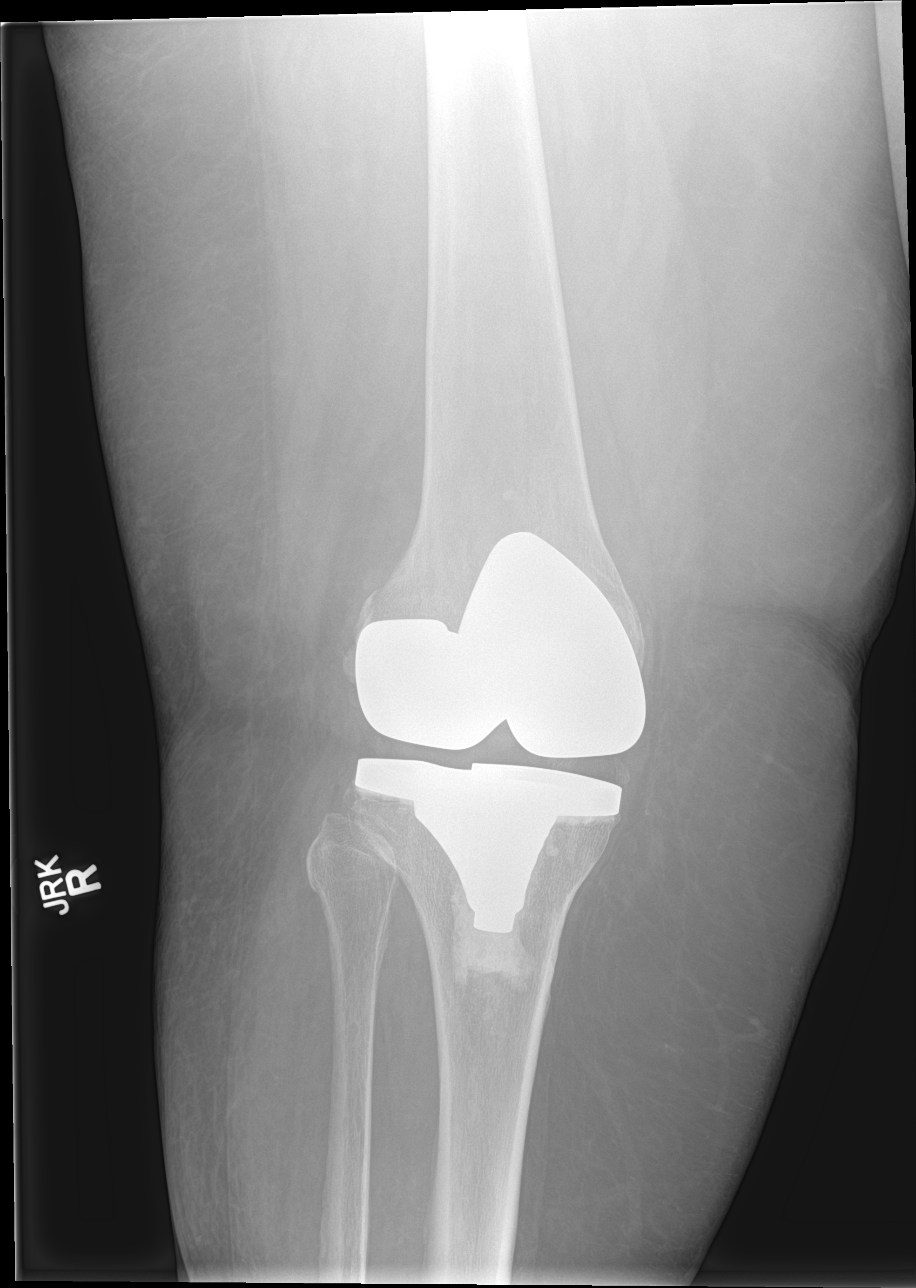

[sunrise]
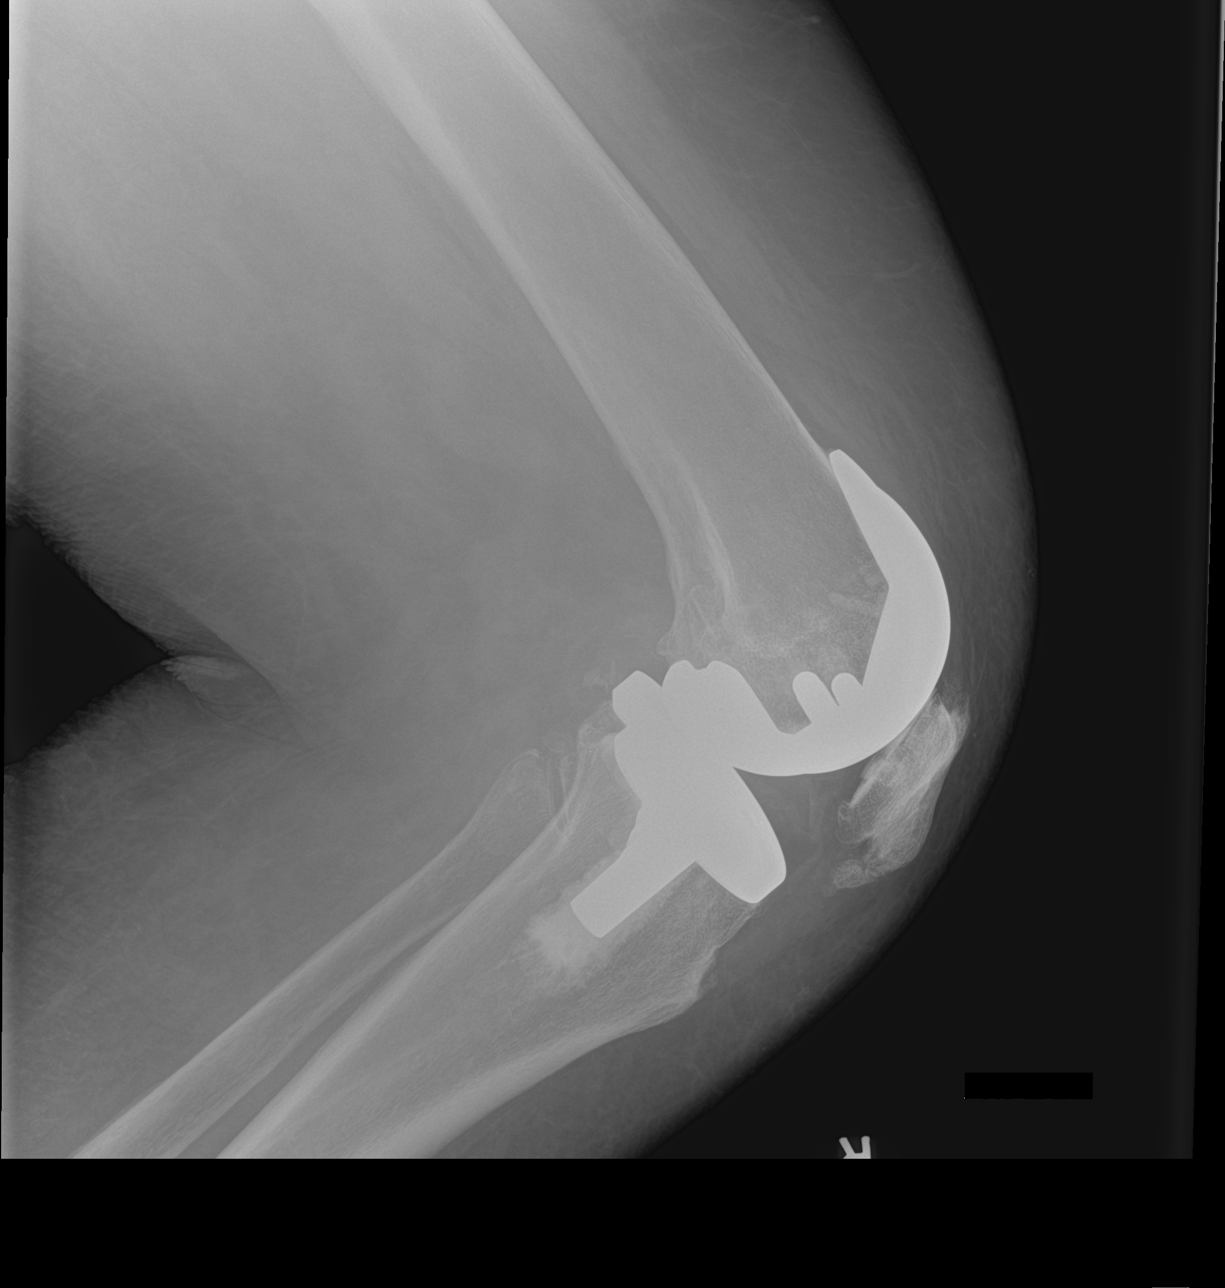

[4 of 4 positions shown; findings below may reference images not displayed]

FINDINGS: There is no evidence of fracture or dislocation. The patient's total
knee arthroplasty is grossly unremarkable in appearance. There is no
evidence of loosening. Mild degenerative change is noted at the
inferior pole of the patella.

No significant joint effusion is seen. The visualized soft tissues
are normal in appearance.
IMPRESSION: 1. No evidence of fracture or dislocation.
2. Total knee arthroplasty is grossly unremarkable, without evidence
of loosening.

## 2018-06-20 IMAGING — CT CT HEAD W/O CM
3 series · 15 of 47 positions shown, 18 images · non-contrast
Comparison: Head CT March 10, 2015; brain MRI October 25, 2015

CLINICAL DATA: Pain following fall

EXAM:
CT HEAD WITHOUT CONTRAST
TECHNIQUE: Contiguous axial images were obtained from the base of the skull
through the vertex without intravenous contrast.

[Series 2: head wo · axial · 0.47mm/px · z∈[-140,-15]mm · 9 of 31 slices shown, 12 images]
[im 3/31  brain]
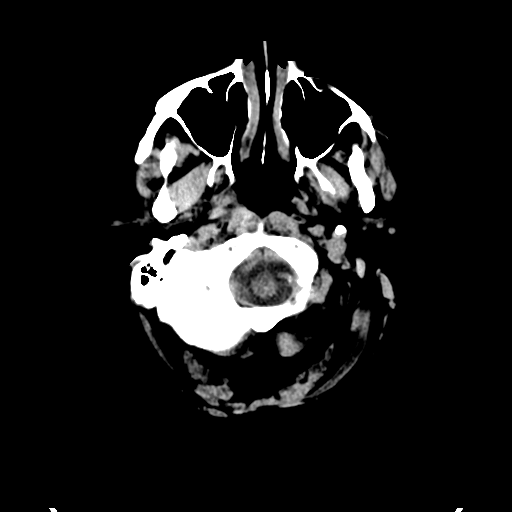
[im 3/31  bone]
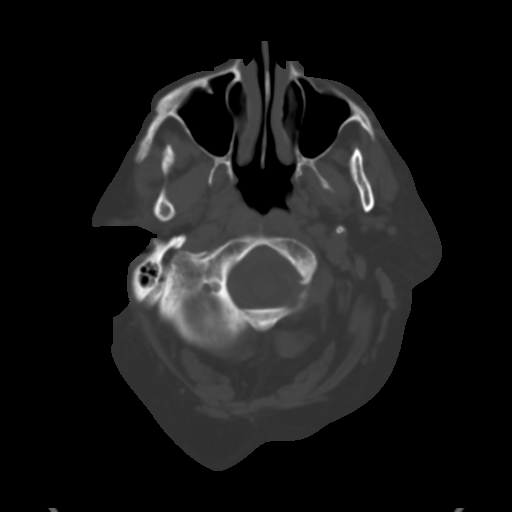
[im 6/31  brain]
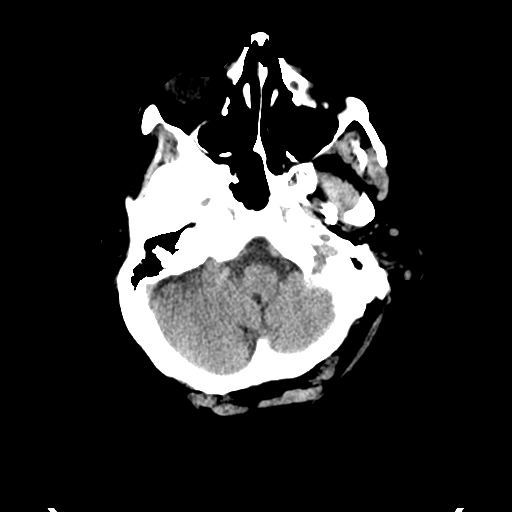
[im 9/31  brain]
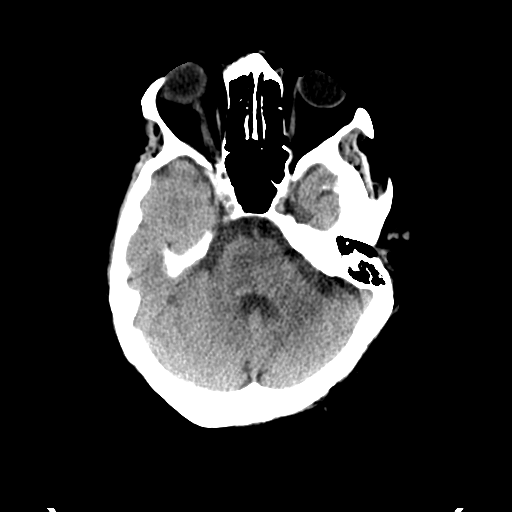
[im 12/31  brain]
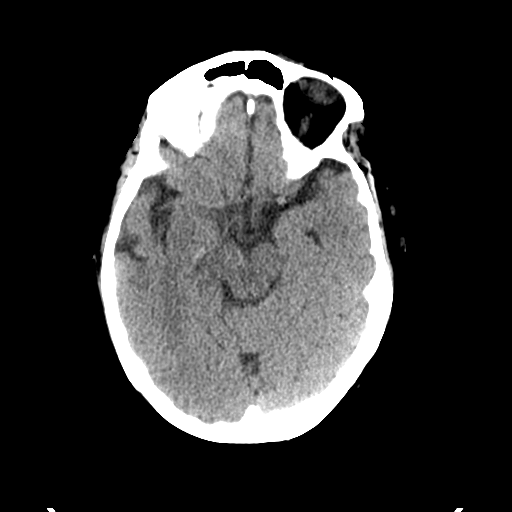
[im 16/31  brain]
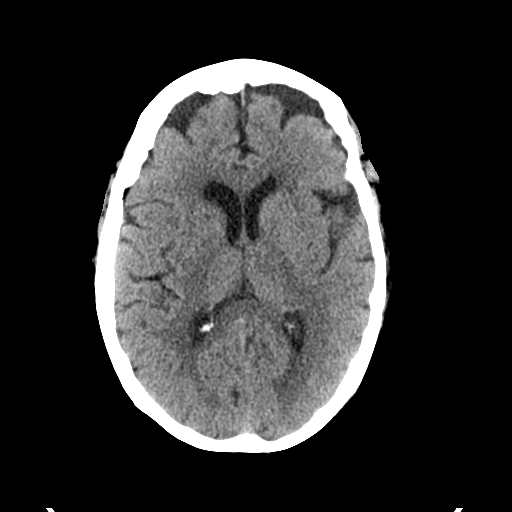
[im 16/31  bone]
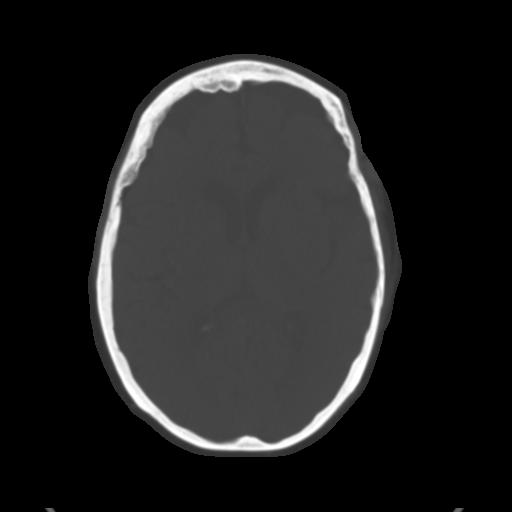
[im 19/31  brain]
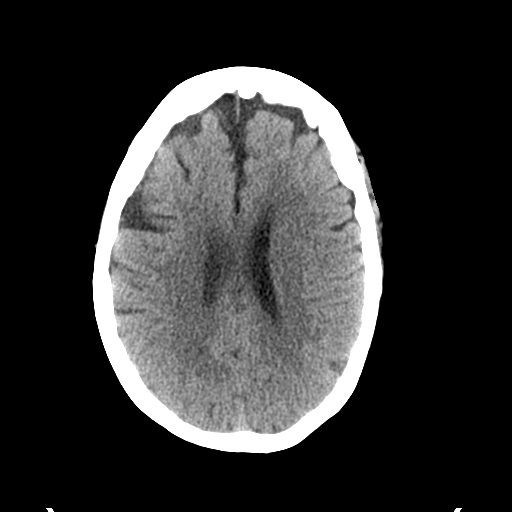
[im 22/31  brain]
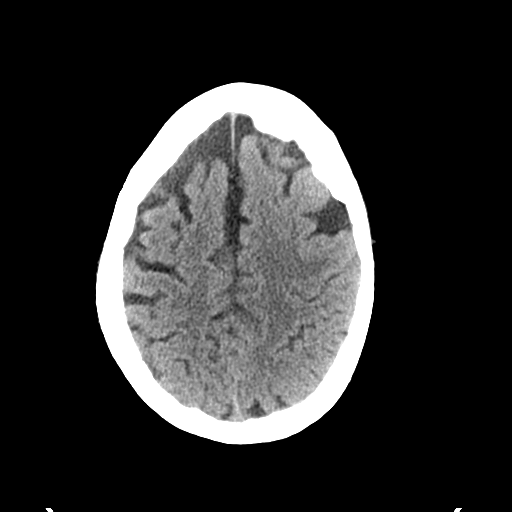
[im 25/31  brain]
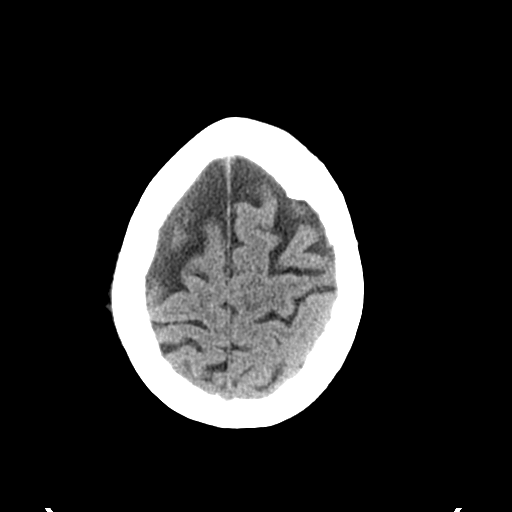
[im 28/31  brain]
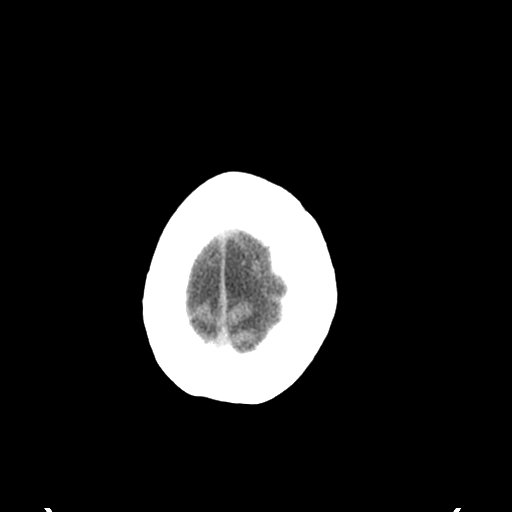
[im 28/31  bone]
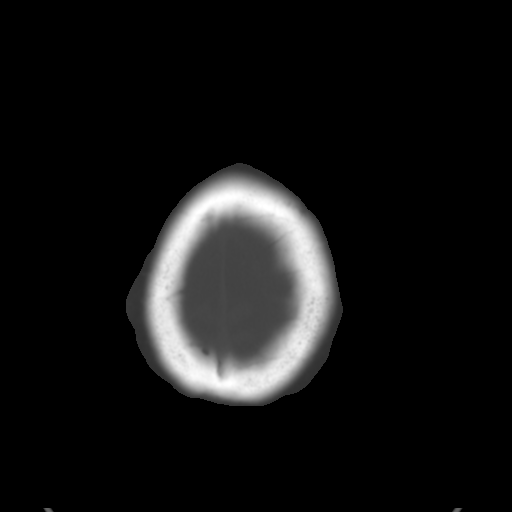

[Series 4: coronal soft tissue · coronal · 0.30mm/px · 3 of 62 slices shown]
[im 21/62  brain]
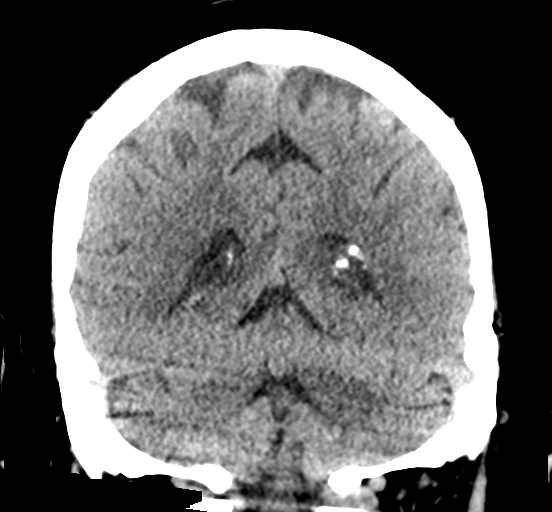
[im 28/62  brain]
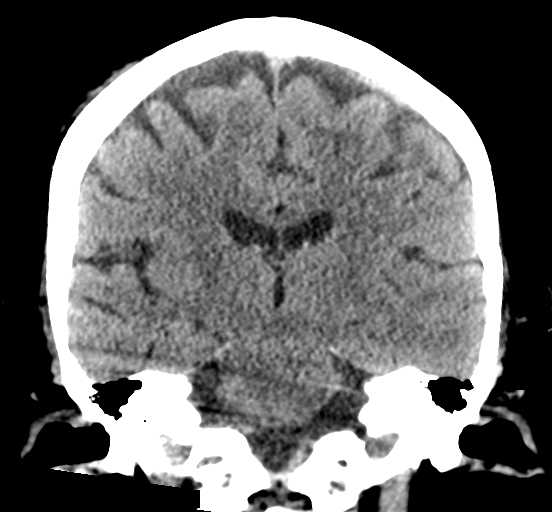
[im 34/62  brain]
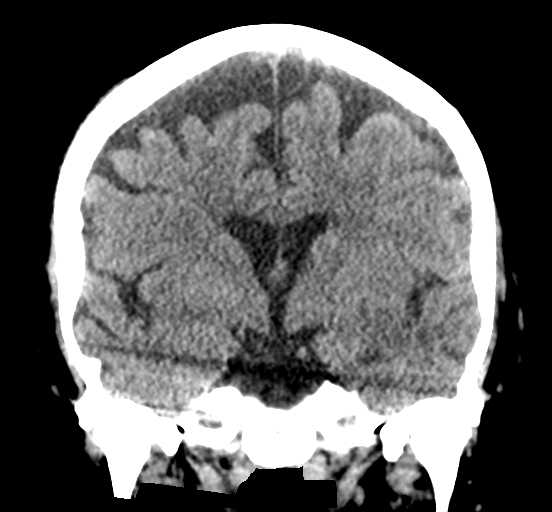

[Series 5: sagittal soft tissue · sagittal · 0.31mm/px · 3 of 47 slices shown]
[im 16/47  brain]
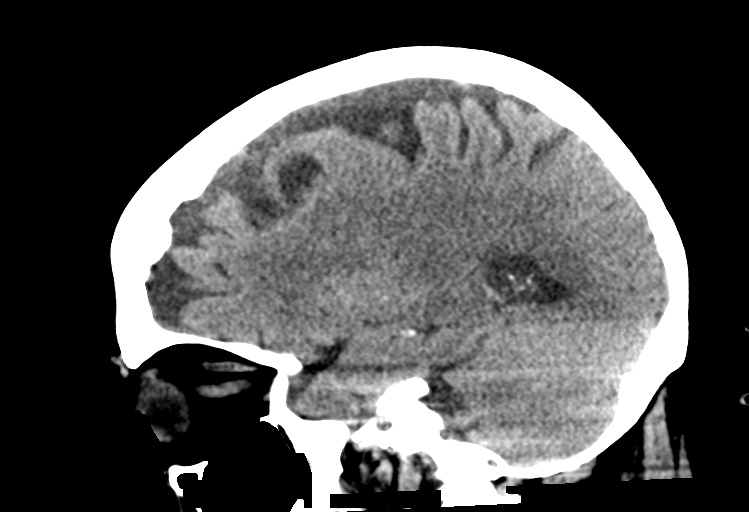
[im 24/47  brain]
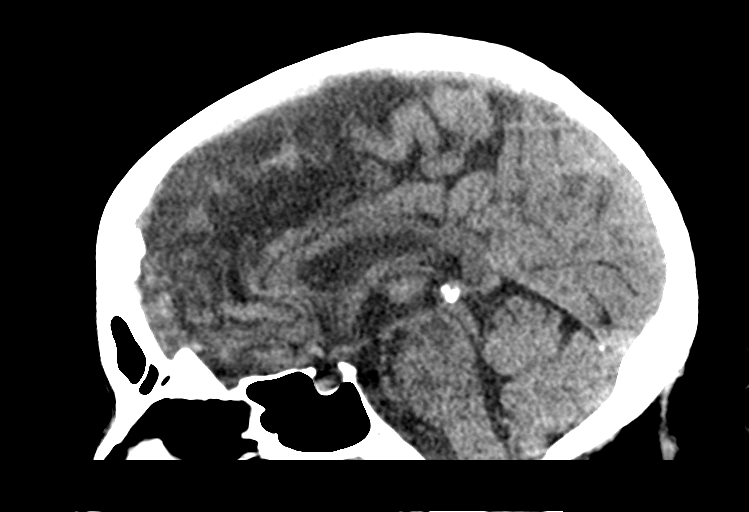
[im 31/47  brain]
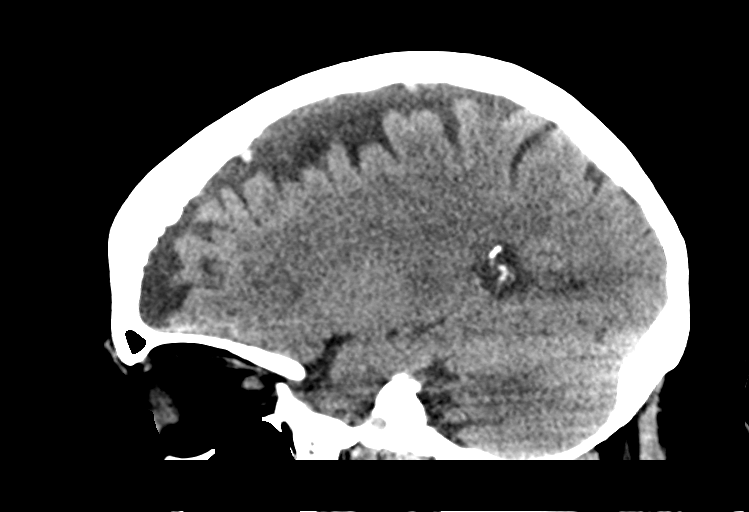

[15 of 47 positions shown; findings below may reference images not displayed]

FINDINGS: Brain: The ventricles are normal in size and configuration for age.
There is, however, moderate frontal atrophy bilaterally, stable.
There is no intracranial mass, hemorrhage, extra-axial fluid
collection, or midline shift. There is mild small vessel disease in
the centra semiovale bilaterally, stable. There is no new gray-white
compartment lesion. No acute infarct evident.

Vascular: There is no hyperdense vessel. There are foci of
calcification in each carotid siphon region, more on the left than
on the right.

Skull: Bony calvarium appears intact.

Sinuses/Orbits: There is opacification in a midportion left ethmoid
air cell. Other paranasal sinuses are clear. Orbits appear symmetric
bilaterally.

Other: Mastoid air cells are clear.
IMPRESSION: Moderate frontal atrophy bilaterally, stable. Mild periventricular
small vessel disease. No intracranial mass, hemorrhage, or
extra-axial fluid collection. No acute infarct evident. Foci of
arterial vascular calcification. Mild left ethmoid sinus disease.

## 2018-06-24 DIAGNOSIS — N3000 Acute cystitis without hematuria: Secondary | ICD-10-CM

## 2018-07-02 DIAGNOSIS — N390 Urinary tract infection, site not specified: Secondary | ICD-10-CM | POA: Diagnosis not present

## 2018-07-02 DIAGNOSIS — G309 Alzheimer's disease, unspecified: Secondary | ICD-10-CM | POA: Diagnosis not present

## 2018-07-02 DIAGNOSIS — I5030 Unspecified diastolic (congestive) heart failure: Secondary | ICD-10-CM

## 2018-07-02 DIAGNOSIS — E1140 Type 2 diabetes mellitus with diabetic neuropathy, unspecified: Secondary | ICD-10-CM

## 2018-07-02 DIAGNOSIS — G4733 Obstructive sleep apnea (adult) (pediatric): Secondary | ICD-10-CM

## 2018-07-02 DIAGNOSIS — M1990 Unspecified osteoarthritis, unspecified site: Secondary | ICD-10-CM

## 2018-07-02 DIAGNOSIS — I10 Essential (primary) hypertension: Secondary | ICD-10-CM

## 2018-07-02 DIAGNOSIS — F39 Unspecified mood [affective] disorder: Secondary | ICD-10-CM

## 2018-07-14 ENCOUNTER — Encounter: Payer: Self-pay | Admitting: Urology

## 2018-07-14 ENCOUNTER — Ambulatory Visit (INDEPENDENT_AMBULATORY_CARE_PROVIDER_SITE_OTHER): Payer: Medicare Other | Admitting: Urology

## 2018-07-14 ENCOUNTER — Other Ambulatory Visit: Payer: Self-pay

## 2018-07-14 VITALS — BP 151/71 | HR 78 | Ht 68.0 in | Wt 334.0 lb

## 2018-07-14 DIAGNOSIS — N390 Urinary tract infection, site not specified: Secondary | ICD-10-CM | POA: Diagnosis not present

## 2018-07-14 DIAGNOSIS — N3946 Mixed incontinence: Secondary | ICD-10-CM

## 2018-07-14 LAB — URINALYSIS, COMPLETE
Bilirubin, UA: NEGATIVE
Glucose, UA: NEGATIVE
Ketones, UA: NEGATIVE
Leukocytes,UA: NEGATIVE
Nitrite, UA: NEGATIVE
Protein,UA: NEGATIVE
Specific Gravity, UA: 1.015 (ref 1.005–1.030)
Urobilinogen, Ur: 0.2 mg/dL (ref 0.2–1.0)
pH, UA: 6.5 (ref 5.0–7.5)

## 2018-07-14 LAB — MICROSCOPIC EXAMINATION

## 2018-07-14 MED ORDER — MIRABEGRON ER 50 MG PO TB24: 50.0000 mg | ORAL_TABLET | Freq: Every day | ORAL | 11 refills | Status: DC

## 2018-07-14 NOTE — Progress Notes (Signed)
07/14/2018 1:48 PM   Cabella Kimm Schillo October 01, 1930 [id number]  Referring provider: Venia Carbon, MD [address] [place], [place] [address]  Chief Complaint  Patient presents with  . Recurrent UTI    New Patient    HPI: I was consulted by the above provider to assess the patient's urinary incontinence persisting for years.  She primarily has urgency incontinence.  She can leak with bending and lifting but not coughing sneezing.  She has mild bedwetting.  She wears 6 heavy pads a day and double pads and they are quite wet.  She voids every 1 or 2 hours gets up twice at night.  She has a functional component and is obese and uses a walker.  She thinks her sugars recently have been high but not yet diagnosed as diabetes.  She is not certain if she took a medication for overactive bladder for a few days.  When she voids she tends to have a bowel movement with loss of control.  She describes recurrent bladder infection but was nonspecific.  It sounds once she had central nervous system symptoms and currently has dysuria.  She normally does not get dysuria with bladder infection  She has had low back surgery.  She has not had a hysterectomy.  She is not had a history of kidney stones or bladder surgery  Modifying factors: There are no other modifying factors  Associated signs and symptoms: There are no other associated signs and symptoms Aggravating and relieving factors: There are no other aggravating or relieving factors Severity: Moderate Duration: Persistent   PMH: Past Medical History:  Diagnosis Date  . 'light-for-dates' infant with signs of fetal malnutrition   . Anxiety   . Chronic kidney disease, stage III (moderate) (HCC)   . Contracture of palmar fascia   . Cough   . Diabetes mellitus 2019   all diabetic meds stopped upon arrival to [facility]  . Hyperlipidemia   . Hypertension   . Lower extremity edema   . Obesity   . Osteoarthritis   . Other specified  acquired hypothyroidism   . Otitis externa   . Palpitations   . Peripheral neuropathy   . Pure hypercholesterolemia   . Secondary hyperparathyroidism (of renal origin)   . Sleep apnea   . Ulcerative colitis ([place])   . Unspecified adjustment reaction   . Unspecified hereditary and idiopathic peripheral neuropathy   . Unspecified urinary incontinence     Surgical History: Past Surgical History:  Procedure Laterality Date  . BACK SURGERY  1990   Ruptured Disc.  Marland Kitchen CARDIAC CATHETERIZATION  03/2008   due to chest pain  . COLONOSCOPY    . COLONOSCOPY WITH PROPOFOL N/A 10/02/2016   Procedure: COLONOSCOPY WITH PROPOFOL;  Surgeon: Lucilla Lame, MD;  Location: [facility] ENDOSCOPY;  Service: Endoscopy;  Laterality: N/A;  . DUPUYTREN CONTRACTURE RELEASE Left 05/27/2017   Procedure: DUPUYTREN CONTRACTURE RELEASE-LEFT INDEX AND PINKY FINGER;  Surgeon: Leanor Kail, MD;  Location: [facility] ORS;  Service: Orthopedics;  Laterality: Left;  . EYE SURGERY Bilateral 2014   cataract extractions with IOL implant  . HAND SURGERY Right 1999   dupeytren release  . REPLACEMENT TOTAL KNEE Bilateral 2004, 1999  . SHOULDER SURGERY Right 06/2007   rotator cuff repair    Home Medications:  Allergies as of 07/14/2018      Reactions   Ace Inhibitors Cough      Medication List       Accurate as of July 14, 2018  1:48 PM. If you have any questions, ask your nurse or doctor.        acetaminophen 500 MG tablet Commonly known as:  TYLENOL Take 500 mg by mouth every 6 (six) hours as needed for mild pain.   amLODipine 5 MG tablet Commonly known as:  NORVASC Take 5 mg by mouth at bedtime.   aspirin 81 MG chewable tablet Chew 81 mg by mouth daily.   budesonide 3 MG 24 hr capsule Commonly known as:  ENTOCORT EC Take by mouth daily.   carvedilol 3.125 MG tablet Commonly known as:  Coreg Take 1 tablet (3.125 mg total) by mouth 2 (two) times daily.   cholestyramine 4 GM/DOSE powder Commonly known as:   QUESTRAN Take 4 g by mouth daily.   Cranberry 425 MG Caps Take 425 mg by mouth daily at 6 (six) AM.   escitalopram 10 MG tablet Commonly known as:  LEXAPRO Take 10 mg by mouth daily.   furosemide 40 MG tablet Commonly known as:  LASIX Take 40 mg by mouth daily as needed for fluid or edema.   imipramine 25 MG tablet Commonly known as:  TOFRANIL Take 25 mg by mouth at bedtime.   ipratropium-albuterol 0.5-2.5 (3) MG/3ML Soln Commonly known as:  DUONEB Take 3 mLs by nebulization every 8 (eight) hours as needed (shortness of breath).   loperamide 2 MG tablet Commonly known as:  Imodium A-D Take 1 tablet (2 mg total) by mouth 4 (four) times daily as needed for diarrhea or loose stools.   loratadine 10 MG tablet Commonly known as:  CLARITIN Take 10 mg by mouth every evening.   LORazepam 0.5 MG tablet Commonly known as:  ATIVAN Take by mouth.   Norco 5-325 MG tablet Generic drug:  HYDROcodone-acetaminophen Take 1-2 tablets by mouth every 6 (six) hours as needed for up to 15 doses for moderate pain. MAXIMUM TOTAL ACETAMINOPHEN DOSE IS 4000 MG PER DAY   nystatin powder Commonly known as:  MYCOSTATIN/NYSTOP Apply topically under pannus and underbilateral breast twice daily as needed for yeast rash   potassium chloride 10 MEQ tablet Commonly known as:  K-DUR Take 1 tablet (10 mEq total) by mouth daily.   simvastatin 20 MG tablet Commonly known as:  ZOCOR Take 20 mg by mouth at bedtime.   Tussin DM 10-100 MG/5ML liquid Generic drug:  dextromethorphan-guaiFENesin Take 10 mLs by mouth every 4 (four) hours as needed for cough.       Allergies:  Allergies  Allergen Reactions  . Ace Inhibitors Cough    Family History: Family History  Problem Relation Age of Onset  . Heart attack Father   . Hypertension Sister   . Hypertension Sister   . Alzheimer's disease Mother     Social History:  reports that she quit smoking about 20 years ago. Her smoking use included  cigarettes. She has a 10.00 pack-year smoking history. She has never used smokeless tobacco. She reports that she does not drink alcohol or use drugs.  ROS: UROLOGY Frequent Urination?: Yes Hard to postpone urination?: Yes Burning/pain with urination?: Yes Get up at night to urinate?: Yes Leakage of urine?: Yes Urine stream starts and stops?: No Trouble starting stream?: No Do you have to strain to urinate?: No Blood in urine?: No Urinary tract infection?: No Sexually transmitted disease?: No Injury to kidneys or bladder?: No Painful intercourse?: No Weak stream?: No Currently pregnant?: No Vaginal bleeding?: No Last menstrual period?: n  Gastrointestinal Nausea?:  No Vomiting?: No Indigestion/heartburn?: No Diarrhea?: Yes Constipation?: No  Constitutional Fever: No Night sweats?: No Weight loss?: No Fatigue?: Yes  Skin Skin rash/lesions?: No Itching?: Yes  Eyes Blurred vision?: No Double vision?: No  Ears/Nose/Throat Sore throat?: No Sinus problems?: No  Hematologic/Lymphatic Swollen glands?: No Easy bruising?: Yes  Cardiovascular Leg swelling?: Yes Chest pain?: No  Respiratory Cough?: No Shortness of breath?: No  Endocrine Excessive thirst?: No  Musculoskeletal Back pain?: No Joint pain?: No  Neurological Headaches?: No Dizziness?: No  Psychologic Depression?: No Anxiety?: No  Physical Exam: BP (!) 151/71   Pulse 78   Ht 5' 8"  (1.727 m)   Wt (!) 334 lb (151.5 kg)   BMI 50.78 kg/m   Constitutional:  Alert and oriented, No acute distress. HEENT: [place] AT, moist mucus membranes.  Trachea midline, no masses. Cardiovascular: No clubbing, cyanosis, or edema. Respiratory: Normal respiratory effort, no increased work of breathing. GI: Abdomen is soft, nontender, nondistended, no abdominal masses GU: No CVA tenderness.  Well supported bladder neck and no prolapse Skin: No rashes, bruises or suspicious lesions. Lymph: No cervical or inguinal  adenopathy. Neurologic: Grossly intact, no focal deficits, moving all 4 extremities. Psychiatric: Normal mood and affect.  Laboratory Data: Lab Results  Component Value Date   WBC 8.2 02/21/2018   HGB 12.7 02/21/2018   HCT 41.1 02/21/2018   MCV 89.3 02/21/2018   PLT 146 (L) 02/21/2018    Lab Results  Component Value Date   CREATININE 1.15 (H) 02/21/2018    No results found for: PSA  No results found for: TESTOSTERONE  Lab Results  Component Value Date   HGBA1C 5.7 (H) 12/02/2015    Urinalysis    Component Value Date/Time   COLORURINE YELLOW (A) 02/24/2016 1808   APPEARANCEUR HAZY (A) 02/24/2016 1808   LABSPEC 1.017 02/24/2016 1808   PHURINE 5.0 02/24/2016 1808   GLUCOSEU NEGATIVE 02/24/2016 1808   HGBUR SMALL (A) 02/24/2016 1808   BILIRUBINUR NEGATIVE 02/24/2016 1808   BILIRUBINUR negative 12/31/2014 1720   KETONESUR 20 (A) 02/24/2016 1808   PROTEINUR 30 (A) 02/24/2016 1808   UROBILINOGEN 0.2 12/31/2014 1720   NITRITE POSITIVE (A) 02/24/2016 1808   LEUKOCYTESUR NEGATIVE 02/24/2016 1808    Pertinent Imaging:   Assessment & Plan: Patient is mixed incontinence but primarily urge incontinence and bedwetting with a functional component.  She has frequency and nocturia.  She may be having bladder infections.  I sent a urine that was catheterized for culture today.  She has medical comorbidities and in January was admitted with heart failure.  A urine culture him 2 years ago was positive  I am suspect that the patient may be having bladder infections and she has not had a recent renal ultrasound.  This may be ordered in the future if this culture is positive.  I would like to treat her for an overactive bladder with timed voiding and fluid modifications and perform cystoscopy on the next visit  Reassess in 6 weeks for pelvic examination and cystoscopy and call if the culture is positive.  Patient on Myrbetriq 50 mg  Think we will need reasonable expectations in  regard to efficacy and treatment outcomes   1. Recurrent UTI  - Urinalysis, Complete - CULTURE, URINE COMPREHENSIVE   Return in about 6 weeks (around 08/25/2018) for cysto.  Reece Packer, MD  [facility] [address] [place], [place] [address] [phone]

## 2018-07-14 NOTE — Progress Notes (Signed)
In and Out Catheterization  Patient is present today for a I & O catheterization due to UTI. Patient was cleaned and prepped in a sterile fashion with betadine and Lidocaine 2% jelly was instilled into the urethra.  A 14FR cath was inserted no complications were noted , 47m of urine return was noted, urine was clear yellow in color. A clean urine sample was collected for UA and culture. Bladder was drained  And catheter was removed with out difficulty.    Preformed by: SFonnie Jarvis CMA

## 2018-07-16 LAB — CULTURE, URINE COMPREHENSIVE

## 2018-07-21 ENCOUNTER — Telehealth: Payer: Self-pay | Admitting: Urology

## 2018-07-21 MED ORDER — NITROFURANTOIN MACROCRYSTAL 100 MG PO CAPS: 100.0000 mg | ORAL_CAPSULE | Freq: Two times a day (BID) | ORAL | 0 refills | Status: DC

## 2018-07-21 MED ORDER — SULFAMETHOXAZOLE-TRIMETHOPRIM 800-160 MG PO TABS: 1.0000 | ORAL_TABLET | Freq: Two times a day (BID) | ORAL | 0 refills | Status: DC

## 2018-07-21 NOTE — Telephone Encounter (Signed)
-----   Message from Bjorn Loser, MD sent at 07/21/2018  8:25 AM EDT ----- Macrodantin 100 mg twice a day for 1 week    ----- Message ----- From: Royanne Foots, CMA Sent: 07/16/2018   9:13 AM EDT To: Bjorn Loser, MD   ----- Message ----- From: Interface, [facility] Lab Results In Sent: 07/14/2018   4:36 PM EDT To: [facility]

## 2018-07-21 NOTE — Telephone Encounter (Signed)
Spoke with the pharmacy and cancelled [employer] and sent in septra per Dr. Matilde Sprang

## 2018-07-21 NOTE — Telephone Encounter (Signed)
Called pharmacy to let them know that the prescription  for the patient was as intended by the doctor per CMA Sarah. Pharmacist wanted to inform the practice that the medication would need to be ordered and would take a day before being ready and wanted to make sure that the provider was ok with that, or if the provider wanted to send the script to a different pharmacy.

## 2018-07-21 NOTE — Telephone Encounter (Signed)
Pt called office after hours leaving a message with nurse line asking for her urine culture results that was taken on 6/01. Pt is currently at [facility] and left a phone number of [phone] to call. Please advise. Thanks.

## 2018-07-21 NOTE — Telephone Encounter (Signed)
Patient notified on voicemail per DPR , script sent

## 2018-07-22 ENCOUNTER — Telehealth: Payer: Self-pay | Admitting: Urology

## 2018-07-22 NOTE — Telephone Encounter (Signed)
Faxed copy of culture results and Bactrim orders to [place] at [facility] 07/22/2018

## 2018-07-22 NOTE — Telephone Encounter (Signed)
[facility] called and would like a call back concerning Deborah [name]'s care. Please advise.

## 2018-08-22 DIAGNOSIS — B351 Tinea unguium: Secondary | ICD-10-CM

## 2018-08-25 ENCOUNTER — Other Ambulatory Visit: Payer: Self-pay

## 2018-08-25 ENCOUNTER — Ambulatory Visit (INDEPENDENT_AMBULATORY_CARE_PROVIDER_SITE_OTHER): Payer: Medicare Other | Admitting: Urology

## 2018-08-25 DIAGNOSIS — F0390 Unspecified dementia without behavioral disturbance: Secondary | ICD-10-CM | POA: Diagnosis not present

## 2018-08-25 DIAGNOSIS — I5032 Chronic diastolic (congestive) heart failure: Secondary | ICD-10-CM | POA: Diagnosis not present

## 2018-08-25 DIAGNOSIS — F39 Unspecified mood [affective] disorder: Secondary | ICD-10-CM

## 2018-08-25 DIAGNOSIS — N390 Urinary tract infection, site not specified: Secondary | ICD-10-CM | POA: Diagnosis not present

## 2018-08-25 DIAGNOSIS — N3020 Other chronic cystitis without hematuria: Secondary | ICD-10-CM | POA: Diagnosis not present

## 2018-08-25 DIAGNOSIS — E1142 Type 2 diabetes mellitus with diabetic polyneuropathy: Secondary | ICD-10-CM | POA: Diagnosis not present

## 2018-08-25 DIAGNOSIS — N183 Chronic kidney disease, stage 3 (moderate): Secondary | ICD-10-CM | POA: Diagnosis not present

## 2018-08-25 NOTE — Patient Instructions (Signed)
Start Trimethoprim 154m 1 tablet daily

## 2018-08-25 NOTE — Progress Notes (Signed)
08/25/2018 1:39 PM   Deborah [name] 08-Sep-1930 [id number]  Referring provider: Venia Carbon, MD [address] [place],  [place] [address]  Chief Complaint  Patient presents with  . Cysto    HPI: I was consulted to assess the patient's urinary incontinence persisting for years.  She primarily has urgency incontinence.  She can leak with bending and lifting but not coughing sneezing.  She has mild bedwetting.  She wears 6 heavy pads a day and double pads and they are quite wet.  She voids every 1 or 2 hours gets up twice at night.  She has a functional component and is obese and uses a walker.  She thinks her sugars recently have been high but not yet diagnosed as diabetes.   When she voids she tends to have a bowel movement with loss of control.  She describes recurrent bladder infection but was nonspecific.  It sounds once she had central nervous system symptoms and currently has dysuria.    Well supported bladder neck and no prolapse  Patient is mixed incontinence but primarily urge incontinence and bedwetting with a functional component.  She has frequency and nocturia.  She may be having bladder infections.  I sent a urine that was catheterized for culture today.  She has medical comorbidities and in January was admitted with heart failure.  A urine culture 2 years ago was positive  I am suspect that the patient may be having bladder infections and she has not had a recent renal ultrasound.  This may be ordered in the future if this culture is positive.  I would like to treat her for an overactive bladder with timed voiding and fluid modifications and perform cystoscopy on the next visit  Reassess in 6 weeks for pelvic examination and cystoscopy and call if the culture is positive.  Patient on Myrbetriq 50 mg  Think we will need reasonable expectations in regard to efficacy and treatment outcomes  Today Patient did have a positive urine culture last time and  actually had 3 organisms.  She is on Myrbetriq and said it is not helping and the antibiotic did not work  Cystoscopy: Patient underwent flexible cystoscopy utilizing sterile technique.  The bladder mucosa was mildly erythematous but looked more edematous.  Trigone normal.  Ureteral orifices normal.  No foreign body.  No carcinoma.  She tolerated well.  PMH: Past Medical History:  Diagnosis Date  . 'light-for-dates' infant with signs of fetal malnutrition   . Anxiety   . Chronic kidney disease, stage III (moderate) (HCC)   . Contracture of palmar fascia   . Cough   . Diabetes mellitus 2019   all diabetic meds stopped upon arrival to [facility]  . Hyperlipidemia   . Hypertension   . Lower extremity edema   . Obesity   . Osteoarthritis   . Other specified acquired hypothyroidism   . Otitis externa   . Palpitations   . Peripheral neuropathy   . Pure hypercholesterolemia   . Secondary hyperparathyroidism (of renal origin)   . Sleep apnea   . Ulcerative colitis ([place])   . Unspecified adjustment reaction   . Unspecified hereditary and idiopathic peripheral neuropathy   . Unspecified urinary incontinence     Surgical History: Past Surgical History:  Procedure Laterality Date  . BACK SURGERY  1990   Ruptured Disc.  Marland Kitchen CARDIAC CATHETERIZATION  03/2008   due to chest pain  . COLONOSCOPY    . COLONOSCOPY WITH  PROPOFOL N/A 10/02/2016   Procedure: COLONOSCOPY WITH PROPOFOL;  Surgeon: Lucilla Lame, MD;  Location: [facility] ENDOSCOPY;  Service: Endoscopy;  Laterality: N/A;  . DUPUYTREN CONTRACTURE RELEASE Left 05/27/2017   Procedure: DUPUYTREN CONTRACTURE RELEASE-LEFT INDEX AND PINKY FINGER;  Surgeon: Leanor Kail, MD;  Location: [facility] ORS;  Service: Orthopedics;  Laterality: Left;  . EYE SURGERY Bilateral 2014   cataract extractions with IOL implant  . HAND SURGERY Right 1999   dupeytren release  . REPLACEMENT TOTAL KNEE Bilateral 2004, 1999  . SHOULDER SURGERY Right 06/2007   rotator  cuff repair    Home Medications:  Allergies as of 08/25/2018      Reactions   Ace Inhibitors Cough      Medication List       Accurate as of August 25, 2018  1:39 PM. If you have any questions, ask your nurse or doctor.        acetaminophen 500 MG tablet Commonly known as: TYLENOL Take 500 mg by mouth every 6 (six) hours as needed for mild pain.   amLODipine 5 MG tablet Commonly known as: NORVASC Take 5 mg by mouth at bedtime.   aspirin 81 MG chewable tablet Chew 81 mg by mouth daily.   budesonide 3 MG 24 hr capsule Commonly known as: ENTOCORT EC Take by mouth daily.   carvedilol 3.125 MG tablet Commonly known as: Coreg Take 1 tablet (3.125 mg total) by mouth 2 (two) times daily.   cholestyramine 4 GM/DOSE powder Commonly known as: QUESTRAN Take 4 g by mouth daily.   Cranberry 425 MG Caps Take 425 mg by mouth daily at 6 (six) AM.   escitalopram 10 MG tablet Commonly known as: LEXAPRO Take 10 mg by mouth daily.   furosemide 40 MG tablet Commonly known as: LASIX Take 40 mg by mouth daily as needed for fluid or edema.   imipramine 25 MG tablet Commonly known as: TOFRANIL Take 25 mg by mouth at bedtime.   ipratropium-albuterol 0.5-2.5 (3) MG/3ML Soln Commonly known as: DUONEB Take 3 mLs by nebulization every 8 (eight) hours as needed (shortness of breath).   loperamide 2 MG tablet Commonly known as: Imodium A-D Take 1 tablet (2 mg total) by mouth 4 (four) times daily as needed for diarrhea or loose stools.   loratadine 10 MG tablet Commonly known as: CLARITIN Take 10 mg by mouth every evening.   LORazepam 0.5 MG tablet Commonly known as: ATIVAN Take by mouth.   mirabegron ER 50 MG Tb24 tablet Commonly known as: MYRBETRIQ Take 1 tablet (50 mg total) by mouth daily.   nitrofurantoin 100 MG capsule Commonly known as: Macrodantin Take 1 capsule (100 mg total) by mouth 2 (two) times daily.   Norco 5-325 MG tablet Generic drug:  HYDROcodone-acetaminophen Take 1-2 tablets by mouth every 6 (six) hours as needed for up to 15 doses for moderate pain. MAXIMUM TOTAL ACETAMINOPHEN DOSE IS 4000 MG PER DAY   nystatin powder Commonly known as: MYCOSTATIN/NYSTOP Apply topically under pannus and underbilateral breast twice daily as needed for yeast rash   potassium chloride 10 MEQ tablet Commonly known as: K-DUR Take 1 tablet (10 mEq total) by mouth daily.   simvastatin 20 MG tablet Commonly known as: ZOCOR Take 20 mg by mouth at bedtime.   sulfamethoxazole-trimethoprim 800-160 MG tablet Commonly known as: BACTRIM DS Take 1 tablet by mouth every 12 (twelve) hours.   Tussin DM 10-100 MG/5ML liquid Generic drug: dextromethorphan-guaiFENesin Take 10 mLs by mouth every 4 (four)  hours as needed for cough.       Allergies:  Allergies  Allergen Reactions  . Ace Inhibitors Cough    Family History: Family History  Problem Relation Age of Onset  . Heart attack Father   . Hypertension Sister   . Hypertension Sister   . Alzheimer's disease Mother     Social History:  reports that she quit smoking about 20 years ago. Her smoking use included cigarettes. She has a 10.00 pack-year smoking history. She has never used smokeless tobacco. She reports that she does not drink alcohol or use drugs.  ROS:                                        Physical Exam: There were no vitals taken for this visit.   Laboratory Data: Lab Results  Component Value Date   WBC 8.2 02/21/2018   HGB 12.7 02/21/2018   HCT 41.1 02/21/2018   MCV 89.3 02/21/2018   PLT 146 (L) 02/21/2018    Lab Results  Component Value Date   CREATININE 1.15 (H) 02/21/2018    No results found for: PSA  No results found for: TESTOSTERONE  Lab Results  Component Value Date   HGBA1C 5.7 (H) 12/02/2015    Urinalysis    Component Value Date/Time   COLORURINE YELLOW (A) 02/24/2016 1808   APPEARANCEUR Clear 07/14/2018 1342    LABSPEC 1.017 02/24/2016 1808   PHURINE 5.0 02/24/2016 1808   GLUCOSEU Negative 07/14/2018 1342   HGBUR SMALL (A) 02/24/2016 1808   BILIRUBINUR Negative 07/14/2018 1342   KETONESUR 20 (A) 02/24/2016 1808   PROTEINUR Negative 07/14/2018 1342   PROTEINUR 30 (A) 02/24/2016 1808   UROBILINOGEN 0.2 12/31/2014 1720   NITRITE Negative 07/14/2018 1342   NITRITE POSITIVE (A) 02/24/2016 1808   LEUKOCYTESUR Negative 07/14/2018 1342    Pertinent Imaging:   Assessment & Plan: I would like to see the the patient back on trimethoprim 100 mg with 30 tablets and 11 refills in approximately 7 weeks.  I am suspect that she has chronic cystitis.  I think it is fine if we stop the Myrbetriq at this stage.  Urine sent for culture  1. Recurrent UTI  - Urinalysis, Complete   No follow-ups on file.  Reece Packer, MD  [facility] [address] [place], [place] [address] [phone]

## 2018-08-27 LAB — CULTURE, URINE COMPREHENSIVE

## 2018-09-01 ENCOUNTER — Telehealth: Payer: Self-pay | Admitting: Urology

## 2018-09-01 ENCOUNTER — Telehealth: Payer: Self-pay | Admitting: Family Medicine

## 2018-09-01 MED ORDER — NITROFURANTOIN MACROCRYSTAL 100 MG PO CAPS: 100.0000 mg | ORAL_CAPSULE | Freq: Two times a day (BID) | ORAL | 0 refills | Status: DC

## 2018-09-01 NOTE — Telephone Encounter (Signed)
I spoke to [place] and informed her that because the phone lines are out at [facility] I had to send her ABX to the pharmacy for her husband to take to the facility

## 2018-09-01 NOTE — Telephone Encounter (Signed)
Medication sent to pharmacy  

## 2018-09-01 NOTE — Telephone Encounter (Signed)
-----   Message from Bjorn Loser, MD sent at 08/29/2018 11:13 AM EDT ----- Macrodantin 100 mg twice a day for 7 days            ----- Message ----- From: Kyra Manges, CMA Sent: 08/27/2018   2:38 PM EDT To: Bjorn Loser, MD   ----- Message ----- From: Interface, [facility] Lab Results In Sent: 08/27/2018   7:38 AM EDT To: [facility]

## 2018-09-01 NOTE — Telephone Encounter (Signed)
Deborah [name] from [facility] called and states that all of Deborah [name]'s prescriptions are to be sent to [facility] and not any outside pharmacy and she would like a call back to discuss if Deborah [name] is supposed to be on [place]. Please advise.

## 2018-09-22 ENCOUNTER — Telehealth: Payer: Self-pay | Admitting: Internal Medicine

## 2018-09-22 NOTE — Telephone Encounter (Signed)

## 2018-09-23 ENCOUNTER — Other Ambulatory Visit: Payer: Self-pay

## 2018-09-23 ENCOUNTER — Ambulatory Visit (INDEPENDENT_AMBULATORY_CARE_PROVIDER_SITE_OTHER): Payer: Medicare Other | Admitting: Internal Medicine

## 2018-09-23 ENCOUNTER — Encounter: Payer: Self-pay | Admitting: Internal Medicine

## 2018-09-23 VITALS — BP 134/80 | HR 72 | Temp 97.9°F | Ht 68.0 in | Wt 334.0 lb

## 2018-09-23 DIAGNOSIS — G4719 Other hypersomnia: Secondary | ICD-10-CM | POA: Diagnosis not present

## 2018-09-23 DIAGNOSIS — R609 Edema, unspecified: Secondary | ICD-10-CM | POA: Diagnosis not present

## 2018-09-23 NOTE — Patient Instructions (Signed)
Recommend that you set your current CPAP machine at 11 cm H2O.  Will send for a sleep study.    Sleep Apnea    Sleep apnea is disorder that affects a person's sleep. A person with sleep apnea has abnormal pauses in their breathing when they sleep. It is hard for them to get a good sleep. This makes a person tired during the day. It also can lead to other physical problems. There are three types of sleep apnea. One type is when breathing stops for a short time because your airway is blocked (obstructive sleep apnea). Another type is when the brain sometimes fails to give the normal signal to breathe to the muscles that control your breathing (central sleep apnea). The third type is a combination of the other two types.  HOME CARE   Take all medicine as told by your doctor.  Avoid alcohol, calming medicines (sedatives), and depressant drugs.  Try to lose weight if you are overweight. Talk to your doctor about a healthy weight goal.  Your doctor may have you use a device that helps to open your airway. It can help you get the air that you need. It is called a positive airway pressure (PAP) device.   MAKE SURE YOU:   Understand these instructions.  Will watch your condition.  Will get help right away if you are not doing well or get worse.  It may take approximately 1 month for you to get used to wearing her CPAP every night.  Be sure to work with your machine to get used to it, be patient, it may take time!  If you have trouble tolerating CPAP DO NOT RETURN YOUR MACHINE; Contact our office to see if we can help you tolerate the CPAP better first!

## 2018-09-23 NOTE — Progress Notes (Signed)
[facility] Consultation      Assessment and Plan:  Obstructive sleep apnea with excessive daytime sleepiness. - Current CPAP machine is not working, nonfunctional. - Will order a new sleep study, will order CPAP as indicated.  Orders Placed This Encounter  Procedures  . Home sleep test   .  Return in about 3 months (around 12/24/2018).   Date: 09/23/2018  MRN# [id number] Deborah Jupiter [facility] 06/07/1930  Referring Physician:  Dr. Silvio Pate.   Deborah [name] is a 85 y.o. old female seen in consultation for chief complaint of:    Chief Complaint  Patient presents with  . sleep consult    per [facility]- pt currently wearing cpap avg 10-12hr nightly- feels pressure is not strong enough    HPI:  Deborah [name] is a 85 y.o. old female with a history of CPAP. She has a 83 year old CPAP but it broke, and now she is having to use an older one which does not blow hard enough.  She has been diagnosed with OSA about 15 years or more ago.  She has been feeling tired during the day and has trouble sleeping without it.  She has no other new complaints.  She denies sleep paralysis, cataplexy.   She notes a lot of fluid in her legs, and sometimes it moves up into her belly. She takes lasix 40 mg bid. Deneis cough or hemoptysis, PND.   PMHX:   Past Medical History:  Diagnosis Date  . 'light-for-dates' infant with signs of fetal malnutrition   . Anxiety   . Chronic kidney disease, stage III (moderate) (HCC)   . Contracture of palmar fascia   . Cough   . Diabetes mellitus 2019   all diabetic meds stopped upon arrival to [facility]  . Hyperlipidemia   . Hypertension   . Lower extremity edema   . Obesity   . Osteoarthritis   . Other specified acquired hypothyroidism   . Otitis externa   . Palpitations   . Peripheral neuropathy   . Pure hypercholesterolemia   . Secondary hyperparathyroidism (of renal origin)   . Sleep apnea   . Ulcerative colitis ([place])   .  Unspecified adjustment reaction   . Unspecified hereditary and idiopathic peripheral neuropathy   . Unspecified urinary incontinence    Surgical Hx:  Past Surgical History:  Procedure Laterality Date  . BACK SURGERY  1990   Ruptured Disc.  Marland Kitchen CARDIAC CATHETERIZATION  03/2008   due to chest pain  . COLONOSCOPY    . COLONOSCOPY WITH PROPOFOL N/A 10/02/2016   Procedure: COLONOSCOPY WITH PROPOFOL;  Surgeon: Lucilla Lame, MD;  Location: [facility] ENDOSCOPY;  Service: Endoscopy;  Laterality: N/A;  . DUPUYTREN CONTRACTURE RELEASE Left 05/27/2017   Procedure: DUPUYTREN CONTRACTURE RELEASE-LEFT INDEX AND PINKY FINGER;  Surgeon: Leanor Kail, MD;  Location: [facility] ORS;  Service: Orthopedics;  Laterality: Left;  . EYE SURGERY Bilateral 2014   cataract extractions with IOL implant  . HAND SURGERY Right 1999   dupeytren release  . REPLACEMENT TOTAL KNEE Bilateral 2004, 1999  . SHOULDER SURGERY Right 06/2007   rotator cuff repair   Family Hx:  Family History  Problem Relation Age of Onset  . Heart attack Father   . Hypertension Sister   . Hypertension Sister   . Alzheimer's disease Mother    Social Hx:   Social History   Tobacco Use  . Smoking status: Former Smoker    Packs/day: 1.00  Years: 10.00    Pack years: 10.00    Types: Cigarettes    Quit date: 12/27/1997    Years since quitting: 20.7  . Smokeless tobacco: Never Used  Substance Use Topics  . Alcohol use: No  . Drug use: No   Medication:    Current Outpatient Medications:  .  acetaminophen (TYLENOL) 500 MG tablet, Take 500 mg by mouth every 6 (six) hours as needed for mild pain. , Disp: , Rfl:  .  amLODipine (NORVASC) 5 MG tablet, Take 5 mg by mouth at bedtime., Disp: , Rfl:  .  aspirin 81 MG chewable tablet, Chew 81 mg by mouth daily., Disp: , Rfl:  .  budesonide (ENTOCORT EC) 3 MG 24 hr capsule, Take by mouth daily., Disp: , Rfl:  .  carvedilol (COREG) 3.125 MG tablet, Take 1 tablet (3.125 mg total) by mouth 2 (two) times  daily., Disp: 60 tablet, Rfl: 11 .  cholestyramine (QUESTRAN) 4 GM/DOSE powder, Take 4 g by mouth daily. , Disp: , Rfl:  .  Cranberry 425 MG CAPS, Take 425 mg by mouth daily at 6 (six) AM., Disp: , Rfl:  .  escitalopram (LEXAPRO) 10 MG tablet, Take 10 mg by mouth daily., Disp: , Rfl:  .  furosemide (LASIX) 40 MG tablet, Take 40 mg by mouth daily as needed for fluid or edema. , Disp: , Rfl:  .  imipramine (TOFRANIL) 25 MG tablet, Take 25 mg by mouth at bedtime., Disp: , Rfl:  .  ipratropium-albuterol (DUONEB) 0.5-2.5 (3) MG/3ML SOLN, Take 3 mLs by nebulization every 8 (eight) hours as needed (shortness of breath)., Disp: , Rfl:  .  loperamide (IMODIUM A-D) 2 MG tablet, Take 1 tablet (2 mg total) by mouth 4 (four) times daily as needed for diarrhea or loose stools., Disp: 30 tablet, Rfl: 0 .  loratadine (CLARITIN) 10 MG tablet, Take 10 mg by mouth every evening., Disp: , Rfl:  .  LORazepam (ATIVAN) 0.5 MG tablet, Take by mouth., Disp: , Rfl:  .  mirabegron ER (MYRBETRIQ) 50 MG TB24 tablet, Take 1 tablet (50 mg total) by mouth daily., Disp: 30 tablet, Rfl: 11 .  nitrofurantoin (MACRODANTIN) 100 MG capsule, Take 1 capsule (100 mg total) by mouth 2 (two) times daily., Disp: 14 capsule, Rfl: 0 .  NORCO 5-325 MG tablet, Take 1-2 tablets by mouth every 6 (six) hours as needed for up to 15 doses for moderate pain. MAXIMUM TOTAL ACETAMINOPHEN DOSE IS 4000 MG PER DAY, Disp: 15 tablet, Rfl: 0 .  nystatin (MYCOSTATIN/NYSTOP) powder, Apply topically under pannus and underbilateral breast twice daily as needed for yeast rash, Disp: , Rfl:  .  potassium chloride (K-DUR) 10 MEQ tablet, Take 1 tablet (10 mEq total) by mouth daily., Disp: 30 tablet, Rfl: 0 .  simvastatin (ZOCOR) 20 MG tablet, Take 20 mg by mouth at bedtime. , Disp: 1 tablet, Rfl: 1 .  glipiZIDE (GLUCOTROL) 5 MG tablet, , Disp: , Rfl:    Allergies:  Ace inhibitors  Review of Systems: Gen:  Denies  fever, sweats, chills HEENT: Denies blurred  vision, double vision. bleeds, sore throat Cvc:  No dizziness, chest pain. Resp:   Denies cough or sputum production, shortness of breath Gi: Denies swallowing difficulty, stomach pain. Gu:  Denies bladder incontinence, burning urine Ext:   No Joint pain, stiffness. Skin: No skin rash,  hives  Endoc:  No polyuria, polydipsia. Psych: No depression, insomnia. Other:  All other systems were reviewed with the patient  and were negative other that what is mentioned in the HPI.   Physical Examination:   VS: BP 134/80 (BP Location: Left Arm, Cuff Size: Normal)   Pulse 72   Temp 97.9 F (36.6 C) (Temporal)   Ht 5' 8"  (1.727 m)   Wt (!) 334 lb (151.5 kg)   SpO2 95%   BMI 50.78 kg/m   General Appearance: No distress  Neuro:without focal findings,  speech normal,  HEENT: PERRLA, EOM intact.   Pulmonary: normal breath sounds, No wheezing.  CardiovascularNormal S1,S2.  No m/r/g.   Abdomen: Benign, Soft, non-tender. Renal:  No costovertebral tenderness  GU:  No performed at this time. Endoc: No evident thyromegaly, no signs of acromegaly. Skin:   warm, no rashes, no ecchymosis  Extremities: normal, no cyanosis, clubbing.  Other findings:    LABORATORY PANEL:   CBC No results for input(s): WBC, HGB, HCT, PLT in the last 168 hours. ------------------------------------------------------------------------------------------------------------------  Chemistries  No results for input(s): NA, K, CL, CO2, GLUCOSE, BUN, CREATININE, CALCIUM, MG, AST, ALT, ALKPHOS, BILITOT in the last 168 hours.  Invalid input(s): GFRCGP ------------------------------------------------------------------------------------------------------------------  Cardiac Enzymes No results for input(s): TROPONINI in the last 168 hours. ------------------------------------------------------------  RADIOLOGY:  No results found.     Thank  you for the consultation and for allowing [facility] to assist in the care of your patient. Our recommendations are noted above.  Please contact us if we can be of further service.   Marda Stalker, M.D., F.C.C.P.  Board Certified in Internal Medicine, Pulmonary Medicine, [place], and Sleep Medicine.  [facility] Office Number: [phone]   09/23/2018

## 2018-10-07 ENCOUNTER — Ambulatory Visit: Payer: Medicare Other

## 2018-10-07 ENCOUNTER — Other Ambulatory Visit: Payer: Self-pay

## 2018-10-07 DIAGNOSIS — N170 Acute kidney failure with tubular necrosis: Secondary | ICD-10-CM | POA: Diagnosis not present

## 2018-10-07 DIAGNOSIS — G4733 Obstructive sleep apnea (adult) (pediatric): Secondary | ICD-10-CM

## 2018-10-07 DIAGNOSIS — G4719 Other hypersomnia: Secondary | ICD-10-CM

## 2018-10-10 ENCOUNTER — Telehealth: Payer: Self-pay | Admitting: Internal Medicine

## 2018-10-10 DIAGNOSIS — G4733 Obstructive sleep apnea (adult) (pediatric): Secondary | ICD-10-CM

## 2018-10-10 NOTE — Telephone Encounter (Signed)
HST performed 10/07/2018 confirmed very severe OSA with AHI of 52. Recommend auto cpap 5-20 h2O. Pt is aware of results and voiced her understanding. Pt wished to proceed with cpap.  Pt requested that I contact [facility] and make them aware of this information. I have spoken to [facility], who requested that order be faxed to her at [phone]. Nothing further is needed.

## 2018-10-13 ENCOUNTER — Ambulatory Visit: Payer: Medicare Other | Admitting: Urology

## 2018-10-21 DIAGNOSIS — L0291 Cutaneous abscess, unspecified: Secondary | ICD-10-CM

## 2018-10-21 DIAGNOSIS — L723 Sebaceous cyst: Secondary | ICD-10-CM

## 2018-10-27 ENCOUNTER — Ambulatory Visit (INDEPENDENT_AMBULATORY_CARE_PROVIDER_SITE_OTHER): Payer: Medicare Other | Admitting: Urology

## 2018-10-27 ENCOUNTER — Encounter: Payer: Self-pay | Admitting: Urology

## 2018-10-27 ENCOUNTER — Other Ambulatory Visit: Payer: Self-pay

## 2018-10-27 VITALS — BP 138/76 | HR 66 | Ht 68.0 in | Wt 334.0 lb

## 2018-10-27 DIAGNOSIS — N3946 Mixed incontinence: Secondary | ICD-10-CM | POA: Diagnosis not present

## 2018-10-27 MED ORDER — OXYBUTYNIN CHLORIDE ER 10 MG PO TB24: 10.0000 mg | ORAL_TABLET | Freq: Every day | ORAL | 11 refills | Status: DC

## 2018-10-27 MED ORDER — TRIMETHOPRIM 100 MG PO TABS: 100.0000 mg | ORAL_TABLET | Freq: Every day | ORAL | 11 refills | Status: DC

## 2018-10-27 NOTE — Progress Notes (Signed)
10/27/2018 3:25 PM   Deborah [name] Nov 29, 1930 [id number]  Referring provider: Venia Carbon, MD [address] [place],  [place] [address]  No chief complaint on file.   HPI: I was consulted to assess the patient's urinary incontinence persisting for years. She primarily has urgency incontinence. She can leak with bending and lifting but not coughing sneezing. She has mild bedwetting. She wears 6 heavy pads a day and double pads and they are quite wet.  She voids every 1 or 2 hours gets up twice at night. She has a functional component and is obese and uses a walker. She thinks her sugars recently have been high but not yet diagnosed as diabetes. When she voids she tends to have a bowel movement with loss of control.  She describes recurrent bladder infection but was nonspecific. It sounds once she had central nervous system symptoms and currently has dysuria.   Well supported bladder neck and no prolapse  Patient is mixed incontinence but primarily urge incontinence and bedwetting with a functional component. She has frequency and nocturia. She may be having bladder infections. I sent a urine that was catheterized for culture today. She has medical comorbidities and in January was admitted with heart failure. A urine culture 2 years ago was positive  Think we will need reasonable expectations in regard to efficacy and treatment outcomes  Patient did have a positive urine culture last time and actually had 3 organisms.  She is on Myrbetriq and said it is not helping and the antibiotic did not work  Cystoscopy: Patient underwent flexible cystoscopy utilizing sterile technique.  The bladder mucosa was mildly erythematous but looked more edematous.    She was placed on trimethoprim and stop the Myrbetriq  Today Frequency stable Last urine culture positive  Clinically not infected but no change in incontinence.   PMH: Past Medical History:   Diagnosis Date  . 'light-for-dates' infant with signs of fetal malnutrition   . Anxiety   . Chronic kidney disease, stage III (moderate) (HCC)   . Contracture of palmar fascia   . Cough   . Diabetes mellitus 2019   all diabetic meds stopped upon arrival to [facility]  . Hyperlipidemia   . Hypertension   . Lower extremity edema   . Obesity   . Osteoarthritis   . Other specified acquired hypothyroidism   . Otitis externa   . Palpitations   . Peripheral neuropathy   . Pure hypercholesterolemia   . Secondary hyperparathyroidism (of renal origin)   . Sleep apnea   . Ulcerative colitis ([place])   . Unspecified adjustment reaction   . Unspecified hereditary and idiopathic peripheral neuropathy   . Unspecified urinary incontinence     Surgical History: Past Surgical History:  Procedure Laterality Date  . BACK SURGERY  1990   Ruptured Disc.  Marland Kitchen CARDIAC CATHETERIZATION  03/2008   due to chest pain  . COLONOSCOPY    . COLONOSCOPY WITH PROPOFOL N/A 10/02/2016   Procedure: COLONOSCOPY WITH PROPOFOL;  Surgeon: Lucilla Lame, MD;  Location: [facility] ENDOSCOPY;  Service: Endoscopy;  Laterality: N/A;  . DUPUYTREN CONTRACTURE RELEASE Left 05/27/2017   Procedure: DUPUYTREN CONTRACTURE RELEASE-LEFT INDEX AND PINKY FINGER;  Surgeon: Leanor Kail, MD;  Location: [facility] ORS;  Service: Orthopedics;  Laterality: Left;  . EYE SURGERY Bilateral 2014   cataract extractions with IOL implant  . HAND SURGERY Right 1999   dupeytren release  . REPLACEMENT TOTAL KNEE Bilateral 2004, 1999  . SHOULDER  SURGERY Right 06/2007   rotator cuff repair    Home Medications:  Allergies as of 10/27/2018      Reactions   Ace Inhibitors Cough      Medication List       Accurate as of October 27, 2018  3:25 PM. If you have any questions, ask your nurse or doctor.        acetaminophen 500 MG tablet Commonly known as: TYLENOL Take 500 mg by mouth every 6 (six) hours as needed for mild pain.   Align 4 MG Caps  Take 1 capsule by mouth daily.   amLODipine 5 MG tablet Commonly known as: NORVASC Take 5 mg by mouth at bedtime.   aspirin 81 MG chewable tablet Chew 81 mg by mouth daily.   budesonide 3 MG 24 hr capsule Commonly known as: ENTOCORT EC Take by mouth daily.   carvedilol 3.125 MG tablet Commonly known as: Coreg Take 1 tablet (3.125 mg total) by mouth 2 (two) times daily.   cholestyramine 4 GM/DOSE powder Commonly known as: QUESTRAN Take 4 g by mouth daily.   Cranberry 425 MG Caps Take 425 mg by mouth daily at 6 (six) AM.   escitalopram 10 MG tablet Commonly known as: LEXAPRO Take 10 mg by mouth daily.   furosemide 40 MG tablet Commonly known as: LASIX Take 40 mg by mouth daily as needed for fluid or edema.   glipiZIDE 5 MG tablet Commonly known as: GLUCOTROL   imipramine 25 MG tablet Commonly known as: TOFRANIL Take 25 mg by mouth at bedtime.   ipratropium-albuterol 0.5-2.5 (3) MG/3ML Soln Commonly known as: DUONEB Take 3 mLs by nebulization every 8 (eight) hours as needed (shortness of breath).   loperamide 2 MG tablet Commonly known as: Imodium A-D Take 1 tablet (2 mg total) by mouth 4 (four) times daily as needed for diarrhea or loose stools.   loratadine 10 MG tablet Commonly known as: CLARITIN Take 10 mg by mouth every evening.   LORazepam 0.5 MG tablet Commonly known as: ATIVAN Take by mouth.   mirabegron ER 50 MG Tb24 tablet Commonly known as: MYRBETRIQ Take 1 tablet (50 mg total) by mouth daily.   nitrofurantoin 100 MG capsule Commonly known as: Macrodantin Take 1 capsule (100 mg total) by mouth 2 (two) times daily.   Norco 5-325 MG tablet Generic drug: HYDROcodone-acetaminophen Take 1-2 tablets by mouth every 6 (six) hours as needed for up to 15 doses for moderate pain. MAXIMUM TOTAL ACETAMINOPHEN DOSE IS 4000 MG PER DAY   nystatin powder Commonly known as: MYCOSTATIN/NYSTOP Apply topically under pannus and underbilateral breast twice daily  as needed for yeast rash   potassium chloride 10 MEQ tablet Commonly known as: K-DUR Take 1 tablet (10 mEq total) by mouth daily. What changed: how much to take   simvastatin 20 MG tablet Commonly known as: ZOCOR Take 20 mg by mouth at bedtime.   torsemide 10 MG tablet Commonly known as: DEMADEX Take 20 mg by mouth daily.   triamcinolone lotion 0.1 % Commonly known as: KENALOG Apply 1 application topically 3 (three) times daily. Apply to Bilateral Calves topically every 8 hours as needed for stasis-dermatitis   trimethoprim 100 MG tablet Commonly known as: TRIMPEX Take 100 mg by mouth daily.   Tussin DM Max 5-100 MG/5ML Liqd Generic drug: Dextromethorphan-guaiFENesin Take 10 mLs by mouth. 35m q4 hours as needed for cough       Allergies:  Allergies  Allergen Reactions  . Ace  Inhibitors Cough    Family History: Family History  Problem Relation Age of Onset  . Heart attack Father   . Hypertension Sister   . Hypertension Sister   . Alzheimer's disease Mother     Social History:  reports that she quit smoking about 20 years ago. Her smoking use included cigarettes. She has a 10.00 pack-year smoking history. She has never used smokeless tobacco. She reports that she does not drink alcohol or use drugs.  ROS:                                        Physical Exam: There were no vitals taken for this visit.  Constitutional:  Alert and oriented, No acute distress.   Laboratory Data: Lab Results  Component Value Date   WBC 8.2 02/21/2018   HGB 12.7 02/21/2018   HCT 41.1 02/21/2018   MCV 89.3 02/21/2018   PLT 146 (L) 02/21/2018    Lab Results  Component Value Date   CREATININE 1.15 (H) 02/21/2018    No results found for: PSA  No results found for: TESTOSTERONE  Lab Results  Component Value Date   HGBA1C 5.7 (H) 12/02/2015    Urinalysis    Component Value Date/Time   COLORURINE YELLOW (A) 02/24/2016 1808   APPEARANCEUR  Clear 07/14/2018 1342   LABSPEC 1.017 02/24/2016 1808   PHURINE 5.0 02/24/2016 1808   GLUCOSEU Negative 07/14/2018 1342   HGBUR SMALL (A) 02/24/2016 1808   BILIRUBINUR Negative 07/14/2018 1342   KETONESUR 20 (A) 02/24/2016 1808   PROTEINUR Negative 07/14/2018 1342   PROTEINUR 30 (A) 02/24/2016 1808   UROBILINOGEN 0.2 12/31/2014 1720   NITRITE Negative 07/14/2018 1342   NITRITE POSITIVE (A) 02/24/2016 1808   LEUKOCYTESUR Negative 07/14/2018 1342    Pertinent Imaging:   Assessment & Plan: Reassess in 6 weeks on oxybutynin ER 10 mg and trimethoprim  There are no diagnoses linked to this encounter.  No follow-ups on file.  Reece Packer, MD  [facility] [address] [place], [place] [address] [phone]

## 2018-10-29 DIAGNOSIS — N3281 Overactive bladder: Secondary | ICD-10-CM

## 2018-10-29 DIAGNOSIS — F39 Unspecified mood [affective] disorder: Secondary | ICD-10-CM

## 2018-10-29 DIAGNOSIS — N183 Chronic kidney disease, stage 3 (moderate): Secondary | ICD-10-CM

## 2018-10-29 DIAGNOSIS — G309 Alzheimer's disease, unspecified: Secondary | ICD-10-CM

## 2018-10-29 DIAGNOSIS — I10 Essential (primary) hypertension: Secondary | ICD-10-CM

## 2018-10-29 DIAGNOSIS — E1140 Type 2 diabetes mellitus with diabetic neuropathy, unspecified: Secondary | ICD-10-CM

## 2018-10-29 DIAGNOSIS — G4733 Obstructive sleep apnea (adult) (pediatric): Secondary | ICD-10-CM

## 2018-10-29 DIAGNOSIS — I5030 Unspecified diastolic (congestive) heart failure: Secondary | ICD-10-CM

## 2018-10-29 DIAGNOSIS — N390 Urinary tract infection, site not specified: Secondary | ICD-10-CM

## 2018-11-14 DIAGNOSIS — B351 Tinea unguium: Secondary | ICD-10-CM

## 2018-11-25 DIAGNOSIS — R233 Spontaneous ecchymoses: Secondary | ICD-10-CM

## 2018-11-30 ENCOUNTER — Emergency Department
Admission: EM | Admit: 2018-11-30 | Discharge: 2018-12-01 | Disposition: A | Discharge status: Home / Self Care | Payer: Medicare Other | Attending: Emergency Medicine | Admitting: Emergency Medicine

## 2018-11-30 ENCOUNTER — Emergency Department: Payer: Medicare Other

## 2018-11-30 ENCOUNTER — Other Ambulatory Visit: Payer: Self-pay

## 2018-11-30 DIAGNOSIS — E1122 Type 2 diabetes mellitus with diabetic chronic kidney disease: Secondary | ICD-10-CM | POA: Insufficient documentation

## 2018-11-30 DIAGNOSIS — U071 COVID-19: Secondary | ICD-10-CM | POA: Diagnosis not present

## 2018-11-30 DIAGNOSIS — R531 Weakness: Secondary | ICD-10-CM | POA: Diagnosis present

## 2018-11-30 DIAGNOSIS — Z79899 Other long term (current) drug therapy: Secondary | ICD-10-CM | POA: Diagnosis not present

## 2018-11-30 DIAGNOSIS — I129 Hypertensive chronic kidney disease with stage 1 through stage 4 chronic kidney disease, or unspecified chronic kidney disease: Secondary | ICD-10-CM | POA: Diagnosis not present

## 2018-11-30 DIAGNOSIS — Z87891 Personal history of nicotine dependence: Secondary | ICD-10-CM | POA: Diagnosis not present

## 2018-11-30 DIAGNOSIS — Z7984 Long term (current) use of oral hypoglycemic drugs: Secondary | ICD-10-CM | POA: Diagnosis not present

## 2018-11-30 DIAGNOSIS — N183 Chronic kidney disease, stage 3 unspecified: Secondary | ICD-10-CM | POA: Diagnosis not present

## 2018-11-30 LAB — CBC WITH DIFFERENTIAL/PLATELET
Abs Immature Granulocytes: 0.03 10*3/uL (ref 0.00–0.07)
Basophils Absolute: 0 10*3/uL (ref 0.0–0.1)
Basophils Relative: 0 %
Eosinophils Absolute: 0 10*3/uL (ref 0.0–0.5)
Eosinophils Relative: 0 %
HCT: 39.8 % (ref 36.0–46.0)
Hemoglobin: 12.9 g/dL (ref 12.0–15.0)
Immature Granulocytes: 0 %
Lymphocytes Relative: 16 %
Lymphs Abs: 1.1 10*3/uL (ref 0.7–4.0)
MCH: 29.7 pg (ref 26.0–34.0)
MCHC: 32.4 g/dL (ref 30.0–36.0)
MCV: 91.5 fL (ref 80.0–100.0)
Monocytes Absolute: 0.6 10*3/uL (ref 0.1–1.0)
Monocytes Relative: 9 %
Neutro Abs: 5.1 10*3/uL (ref 1.7–7.7)
Neutrophils Relative %: 75 %
Platelets: 132 10*3/uL — ABNORMAL LOW (ref 150–400)
RBC: 4.35 MIL/uL (ref 3.87–5.11)
RDW: 15.8 % — ABNORMAL HIGH (ref 11.5–15.5)
WBC: 6.8 10*3/uL (ref 4.0–10.5)
nRBC: 0 % (ref 0.0–0.2)

## 2018-11-30 LAB — COMPREHENSIVE METABOLIC PANEL
ALT: 14 U/L (ref 0–44)
AST: 17 U/L (ref 15–41)
Albumin: 3.4 g/dL — ABNORMAL LOW (ref 3.5–5.0)
Alkaline Phosphatase: 64 U/L (ref 38–126)
Anion gap: 11 (ref 5–15)
BUN: 34 mg/dL — ABNORMAL HIGH (ref 8–23)
CO2: 28 mmol/L (ref 22–32)
Calcium: 8.4 mg/dL — ABNORMAL LOW (ref 8.9–10.3)
Chloride: 99 mmol/L (ref 98–111)
Creatinine, Ser: 2.02 mg/dL — ABNORMAL HIGH (ref 0.44–1.00)
GFR calc Af Amer: 25 mL/min — ABNORMAL LOW (ref >60)
GFR calc non Af Amer: 22 mL/min — ABNORMAL LOW (ref >60)
Glucose, Bld: 133 mg/dL — ABNORMAL HIGH (ref 70–99)
Potassium: 3.5 mmol/L (ref 3.5–5.1)
Sodium: 138 mmol/L (ref 135–145)
Total Bilirubin: 0.6 mg/dL (ref 0.3–1.2)
Total Protein: 6.6 g/dL (ref 6.5–8.1)

## 2018-11-30 LAB — TROPONIN I (HIGH SENSITIVITY): Troponin I (High Sensitivity): 16 ng/L (ref <18)

## 2018-11-30 MED ORDER — SODIUM CHLORIDE 0.9 % IV BOLUS
1000.0000 mL | Freq: Once | INTRAVENOUS | Status: AC
  Administered 2018-11-30: 1000 mL via INTRAVENOUS

## 2018-11-30 NOTE — ED Triage Notes (Signed)
Pt presents via EMS with reported positive COVID19 today. Reports cough for a few days.

## 2018-11-30 NOTE — ED Notes (Signed)
Deborah [name] [phone] updated on pt's information at this time.

## 2018-11-30 NOTE — ED Notes (Signed)
Pt updated on delay for DC.

## 2018-11-30 NOTE — ED Notes (Signed)
Son updated on pt plan for DC.

## 2018-11-30 NOTE — ED Provider Notes (Signed)
[facility] Emergency Department Provider Note  ____________________________________________  Time seen: Approximately 6:11 PM  I have reviewed the triage vital signs and the nursing notes.   HISTORY  Chief Complaint Weakness    HPI Deborah [name] is a 85 y.o. female who presents the emergency department from [facility] for complaint of code positive diagnosis.  Patient reports that she has had a cough with no other symptoms for the past several days.  Patient reports that she was tested for COVID-19 and was positive.  Patient reports that the facility as well as her family "overreacted" and sent her to the emergency department.  Patient denies any complaints other than cough.  She states that she does have some mild exertional shortness of breath but states that this is typically chronic.  Patient denies any fevers or chills, nasal congestion, sore throat, difficulty breathing abdominal pain, nausea vomiting.  No chest pain.  Patient has a significant medical history asked described below to include chronic kidney disease, diabetes, hyperlipidemia, hypertension, osteoarthritis, peripheral neuropathy, hypercholesterolemia, sleep apnea, ulcerative colitis.  No complaints of chronic medical problems.         Past Medical History:  Diagnosis Date  . 'light-for-dates' infant with signs of fetal malnutrition   . Anxiety   . Chronic kidney disease, stage III (moderate)   . Contracture of palmar fascia   . Cough   . Diabetes mellitus 2019   all diabetic meds stopped upon arrival to [facility]  . Hyperlipidemia   . Hypertension   . Lower extremity edema   . Obesity   . Osteoarthritis   . Other specified acquired hypothyroidism   . Otitis externa   . Palpitations   . Peripheral neuropathy   . Pure hypercholesterolemia   . Secondary hyperparathyroidism (of renal origin)   . Sleep apnea   . Ulcerative colitis ([place])   . Unspecified adjustment reaction   .  Unspecified hereditary and idiopathic peripheral neuropathy   . Unspecified urinary incontinence     Patient Active Problem List   Diagnosis Date Noted  . Pneumonia 02/19/2018  . Benign neoplasm of cecum   . Benign neoplasm of transverse colon   . Lewy body dementia without behavioral disturbance ([place])   . Palliative care by specialist   . DNR (do not resuscitate)   . Goals of care, counseling/discussion   . Sepsis ([place]) 02/24/2016  . Urinary tract infection without hematuria 02/24/2016  . Memory loss 07/18/2015  . Depression 01/12/2015  . Shoulder pain 12/17/2014  . Pain in shoulder 08/05/2014  . Gonalgia 07/21/2014  . History of knee surgery 07/21/2014  . Allergic rhinitis 07/16/2014  . Anxiety 07/16/2014  . Benign hypertension 07/16/2014  . Alteration in bowel elimination: incontinence 07/16/2014  . Chronic kidney disease (CKD), stage III (moderate) 07/16/2014  . Colitis 07/16/2014  . CN (constipation) 07/16/2014  . Diarrhea 07/16/2014  . Contracture of palmar fascia (Dupuytren's) 07/16/2014  . Hypercholesteremia 07/16/2014  . Hypoxia 07/16/2014  . Absence of bladder continence 07/16/2014  . Focal lymphocytic colitis 07/16/2014  . Extreme obesity 07/16/2014  . Arthritis, degenerative 07/16/2014  . Disorder of peripheral nervous system 07/16/2014  . Neuralgia neuritis, sciatic nerve 07/16/2014  . Hyperparathyroidism, secondary ([place]) 07/16/2014  . Apnea, sleep 07/16/2014  . Subclinical hypothyroidism 07/16/2014  . Controlled type 2 diabetes mellitus ([place]) 07/16/2014  . 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 Urinary incontinence without sensory awareness 09/03/2012  . Palpitations 12/28/2010  . SOB (shortness of breath) 12/28/2010  .  Overweight 08/02/2009    Past Surgical History:  Procedure Laterality Date  . BACK SURGERY  1990   Ruptured Disc.  Marland Kitchen CARDIAC CATHETERIZATION  03/2008   due to chest pain  . COLONOSCOPY    . COLONOSCOPY WITH PROPOFOL N/A 10/02/2016    Procedure: COLONOSCOPY WITH PROPOFOL;  Surgeon: Lucilla Lame, MD;  Location: [facility] ENDOSCOPY;  Service: Endoscopy;  Laterality: N/A;  . DUPUYTREN CONTRACTURE RELEASE Left 05/27/2017   Procedure: DUPUYTREN CONTRACTURE RELEASE-LEFT INDEX AND PINKY FINGER;  Surgeon: Leanor Kail, MD;  Location: [facility] ORS;  Service: Orthopedics;  Laterality: Left;  . EYE SURGERY Bilateral 2014   cataract extractions with IOL implant  . HAND SURGERY Right 1999   dupeytren release  . REPLACEMENT TOTAL KNEE Bilateral 2004, 1999  . SHOULDER SURGERY Right 06/2007   rotator cuff repair    Prior to Admission medications   Medication Sig Start Date End Date Taking? Authorizing Provider  acetaminophen (TYLENOL) 500 MG tablet Take 500 mg by mouth every 6 (six) hours as needed for mild pain.     [provider]  amLODipine (NORVASC) 5 MG tablet Take 5 mg by mouth at bedtime.    [provider]  aspirin 81 MG chewable tablet Chew 81 mg by mouth daily.    [provider]  budesonide (ENTOCORT EC) 3 MG 24 hr capsule Take by mouth daily.    [provider]  carvedilol (COREG) 3.125 MG tablet Take 1 tablet (3.125 mg total) by mouth 2 (two) times daily. 02/21/18 02/21/19  Vaughan Basta, MD  cephALEXin (KEFLEX) 500 MG capsule  10/21/18   [provider]  cholestyramine Lucrezia Starch) 4 GM/DOSE powder Take 4 g by mouth daily.     [provider]  Cranberry 425 MG CAPS Take 425 mg by mouth daily at 6 (six) AM.    [provider]  escitalopram (LEXAPRO) 10 MG tablet Take 10 mg by mouth daily.    [provider]  furosemide (LASIX) 40 MG tablet Take 40 mg by mouth daily as needed for fluid or edema.     [provider]  glipiZIDE (GLUCOTROL) 5 MG tablet  08/29/18   [provider]  guaifenesin (ROBITUSSIN) 100 MG/5ML syrup Take by mouth.    [provider]  imipramine (TOFRANIL) 25 MG tablet Take 25 mg by mouth at bedtime.    [provider]  ipratropium-albuterol (DUONEB) 0.5-2.5 (3) MG/3ML SOLN Take 3 mLs by nebulization every 8 (eight) hours as needed (shortness of breath).    [provider]  loperamide (IMODIUM A-D) 2 MG tablet Take 1 tablet (2 mg total) by mouth 4 (four) times daily as needed for diarrhea or loose stools. 10/16/16   Lucilla Lame, MD  loratadine (CLARITIN) 10 MG tablet Take 10 mg by mouth every evening.    [provider]  LORazepam (ATIVAN) 0.5 MG tablet Take by mouth. 09/11/13   [provider]  mirabegron ER (MYRBETRIQ) 50 MG TB24 tablet Take 1 tablet (50 mg total) by mouth daily. 07/14/18   Bjorn Loser, MD  nitrofurantoin (MACRODANTIN) 100 MG capsule Take 1 capsule (100 mg total) by mouth 2 (two) times daily. 09/01/18   Bjorn Loser, MD  nitrofurantoin, macrocrystal-monohydrate, (MACROBID) 100 MG capsule  09/01/18   [provider]  NORCO 5-325 MG tablet Take 1-2 tablets by mouth every 6 (six) hours as needed for up to 15 doses for moderate pain. MAXIMUM TOTAL ACETAMINOPHEN DOSE IS 4000 MG PER DAY 05/27/17  Leanor Kail, MD  nystatin (MYCOSTATIN/NYSTOP) powder Apply topically under pannus and underbilateral breast twice daily as needed for yeast rash    [provider]  oxybutynin (DITROPAN-XL) 10 MG 24 hr tablet Take 1 tablet (10 mg total) by mouth daily. 10/27/18   MacDiarmid, Nicki Reaper, MD  potassium chloride (K-DUR) 10 MEQ tablet Take 1 tablet (10 mEq total) by mouth daily. Patient taking differently: Take 20 mEq by mouth daily.  02/21/18   Vaughan Basta, MD  potassium chloride SA (K-DUR) 20 MEQ tablet  10/24/18   [provider]  Probiotic Product (ALIGN) 4 MG CAPS Take 1 capsule by mouth daily.    [provider]  simvastatin (ZOCOR) 20 MG tablet Take 20 mg by mouth at bedtime.  07/18/15   Margarita Rana, MD  torsemide (DEMADEX) 10 MG tablet Take 20 mg by mouth daily.    [provider]  triamcinolone lotion  (KENALOG) 0.1 % Apply 1 application topically 3 (three) times daily. Apply to Bilateral [place]ves topically every 8 hours as needed for stasis-dermatitis    [provider]  trimethoprim (TRIMPEX) 100 MG tablet Take 1 tablet (100 mg total) by mouth daily. 10/27/18   Bjorn Loser, MD    Allergies Ace inhibitors  Family History  Problem Relation Age of Onset  . Heart attack Father   . Hypertension Sister   . Hypertension Sister   . Alzheimer's disease Mother     Social History Social History   Tobacco Use  . Smoking status: Former Smoker    Packs/day: 1.00    Years: 10.00    Pack years: 10.00    Types: Cigarettes    Quit date: 12/27/1997    Years since quitting: 20.9  . Smokeless tobacco: Never Used  Substance Use Topics  . Alcohol use: No  . Drug use: No     Review of Systems  Constitutional: No fever/chills Eyes: No visual changes. No discharge ENT: No upper respiratory complaints. Cardiovascular: no chest pain. Respiratory: Positive cough.  Baseline SOB. Gastrointestinal: No abdominal pain.  No nausea, no vomiting.  No diarrhea.  No constipation. Genitourinary: Negative for dysuria. No hematuria Musculoskeletal: Negative for musculoskeletal pain. Skin: Negative for rash, abrasions, lacerations, ecchymosis. Neurological: Negative for headaches, focal weakness or numbness. 10-point ROS otherwise negative.  ____________________________________________   PHYSICAL EXAM:  VITAL SIGNS: ED Triage Vitals  Enc Vitals Group     BP      Pulse      Resp      Temp      Temp src      SpO2      Weight      Height      Head Circumference      Peak Flow      Pain Score      Pain Loc      Pain Edu?      Excl. in [place]?      Constitutional: Alert and oriented. Well appearing and in no acute distress. Eyes: Conjunctivae are normal. PERRL. EOMI. Head: Atraumatic. ENT:      Ears:       Nose: No congestion/rhinnorhea.      Mouth/Throat: Mucous membranes  are moist.  Neck: No stridor.   Hematological/Lymphatic/Immunilogical: No cervical lymphadenopathy. Cardiovascular: Normal rate, regular rhythm. Normal S1 and S2.  Good peripheral circulation. Respiratory: Normal respiratory effort without tachypnea or retractions. Lungs CTAB with no wheezes, rales or rhonchi.Kermit Balo air entry to the bases with no decreased or  absent breath sounds. Gastrointestinal: Bowel sounds 4 quadrants. Soft and nontender to palpation. No guarding or rigidity. No palpable masses. No distention. Musculoskeletal: Full range of motion to all extremities. No gross deformities appreciated. Neurologic:  Normal speech and language. No gross focal neurologic deficits are appreciated.  Skin:  Skin is warm, dry and intact. No rash noted. Psychiatric: Mood and affect are normal. Speech and behavior are normal. Patient exhibits appropriate insight and judgement.   ____________________________________________   LABS (all labs ordered are listed, but only abnormal results are displayed)  Labs Reviewed  COMPREHENSIVE METABOLIC PANEL - Abnormal; Notable for the following components:      Result Value   Glucose, Bld 133 (*)    BUN 34 (*)    Creatinine, Ser 2.02 (*)    Calcium 8.4 (*)    Albumin 3.4 (*)    GFR calc non Af Amer 22 (*)    GFR calc Af Amer 25 (*)    All other components within normal limits  CBC WITH DIFFERENTIAL/PLATELET - Abnormal; Notable for the following components:   RDW 15.8 (*)    Platelets 132 (*)    All other components within normal limits  TROPONIN I (HIGH SENSITIVITY)   ____________________________________________  EKG   ____________________________________________  RADIOLOGY I personally viewed and evaluated these images as part of my medical decision making, as well as reviewing the written report by the radiologist.  Dg Chest 1 View  Result Date: 11/30/2018 CLINICAL DATA:  Diagnosed with COVID-19 2 days ago. Fever and increased  weakness. EXAM: CHEST  1 VIEW COMPARISON:  February 20, 2018 FINDINGS: Mild cardiomegaly. The hila and mediastinum are normal. No nodules or masses. Vascular crowding in the medial right lung base. And no focal infiltrate noted. IMPRESSION: No active disease. Electronically Signed   By: Dorise Bullion III M.D   On: 11/30/2018 18:43    ____________________________________________    PROCEDURES  Procedure(s) performed:    Procedures    Medications  sodium chloride 0.9 % bolus 1,000 mL (1,000 mLs Intravenous New Bag/Given 11/30/18 2001)     ____________________________________________   INITIAL IMPRESSION / ASSESSMENT AND PLAN / ED COURSE  Pertinent labs & imaging results that were available during my care of the patient were reviewed by me and considered in my medical decision making (see chart for details).  Review of the [place] CSRS was performed in accordance of the [place] prior to dispensing any controlled drugs.           Patient's diagnosis is consistent with COVID-19.  Patient [facility] the [facility] department [facility] with a known diagnosis of COVID-19.  Patient reports that she has no complaints but was sent to the emergency department by the facility and her family for "just to be checked out."  Patient again states that she has no complaints other than a light cough.  She denies any fever chills, nasal congestion, sore throat, shortness of breath, chest pain, domino pain, nausea or vomiting, diarrhea or constipation.  Overall exam was reassuring.  Patient had a slight bump in her creatinine and was given IV fluids here in the emergency department.  Otherwise patient is stable for discharge.  I discussed return precautions with the patient to include worsening shortness of breath chest pain or other concerning symptoms.  Patient verbalizes understanding of same.  Follow-up primary care as needed.  Patient is given ED precautions to return to the ED  for any worsening or new symptoms.  ____________________________________________  FINAL CLINICAL IMPRESSION(S) / ED DIAGNOSES  Final diagnoses:  COVID-19      NEW MEDICATIONS STARTED DURING THIS VISIT:  ED Discharge Orders    None          This chart was dictated using voice recognition software/Dragon. Despite best efforts to proofread, errors can occur which can change the meaning. Any change was purely unintentional.    Darletta Moll, PA-C 11/30/18 2035    Vanessa , MD 11/30/18 2041

## 2018-12-01 NOTE — ED Notes (Signed)
Patient repositioned in bed and given water at this time.

## 2018-12-17 ENCOUNTER — Emergency Department: Payer: Medicare Other

## 2018-12-17 ENCOUNTER — Inpatient Hospital Stay
Admission: EM | Admit: 2018-12-17 | Discharge: 2018-12-17 | DRG: 177 | Disposition: A | Discharge status: Other Acute Inpatient Hospital | Payer: Medicare Other | Attending: Internal Medicine | Admitting: Internal Medicine

## 2018-12-17 ENCOUNTER — Other Ambulatory Visit: Payer: Self-pay

## 2018-12-17 ENCOUNTER — Inpatient Hospital Stay (HOSPITAL_COMMUNITY)
Admission: AD | Admit: 2018-12-17 | Discharge: 2018-12-23 | DRG: 177 | Disposition: A | Discharge status: Skilled Nursing Facility | Payer: Medicare Other | Source: Other Acute Inpatient Hospital | Attending: Internal Medicine | Admitting: Internal Medicine

## 2018-12-17 DIAGNOSIS — Z888 Allergy status to other drugs, medicaments and biological substances status: Secondary | ICD-10-CM

## 2018-12-17 DIAGNOSIS — Z8249 Family history of ischemic heart disease and other diseases of the circulatory system: Secondary | ICD-10-CM

## 2018-12-17 DIAGNOSIS — E119 Type 2 diabetes mellitus without complications: Secondary | ICD-10-CM

## 2018-12-17 DIAGNOSIS — N1832 Chronic kidney disease, stage 3b: Secondary | ICD-10-CM | POA: Diagnosis present

## 2018-12-17 DIAGNOSIS — Z66 Do not resuscitate: Secondary | ICD-10-CM | POA: Diagnosis present

## 2018-12-17 DIAGNOSIS — M1990 Unspecified osteoarthritis, unspecified site: Secondary | ICD-10-CM | POA: Diagnosis present

## 2018-12-17 DIAGNOSIS — Z820 Family history of epilepsy and other diseases of the nervous system: Secondary | ICD-10-CM

## 2018-12-17 DIAGNOSIS — N183 Chronic kidney disease, stage 3 unspecified: Secondary | ICD-10-CM | POA: Diagnosis present

## 2018-12-17 DIAGNOSIS — N179 Acute kidney failure, unspecified: Secondary | ICD-10-CM | POA: Diagnosis present

## 2018-12-17 DIAGNOSIS — N390 Urinary tract infection, site not specified: Secondary | ICD-10-CM | POA: Diagnosis present

## 2018-12-17 DIAGNOSIS — E785 Hyperlipidemia, unspecified: Secondary | ICD-10-CM | POA: Diagnosis present

## 2018-12-17 DIAGNOSIS — J9601 Acute respiratory failure with hypoxia: Secondary | ICD-10-CM | POA: Diagnosis present

## 2018-12-17 DIAGNOSIS — E668 Other obesity: Secondary | ICD-10-CM | POA: Diagnosis present

## 2018-12-17 DIAGNOSIS — E872 Acidosis: Secondary | ICD-10-CM | POA: Diagnosis present

## 2018-12-17 DIAGNOSIS — Z7984 Long term (current) use of oral hypoglycemic drugs: Secondary | ICD-10-CM

## 2018-12-17 DIAGNOSIS — N170 Acute kidney failure with tubular necrosis: Secondary | ICD-10-CM | POA: Diagnosis present

## 2018-12-17 DIAGNOSIS — E669 Obesity, unspecified: Secondary | ICD-10-CM | POA: Diagnosis present

## 2018-12-17 DIAGNOSIS — I130 Hypertensive heart and chronic kidney disease with heart failure and stage 1 through stage 4 chronic kidney disease, or unspecified chronic kidney disease: Secondary | ICD-10-CM | POA: Diagnosis present

## 2018-12-17 DIAGNOSIS — Z87891 Personal history of nicotine dependence: Secondary | ICD-10-CM

## 2018-12-17 DIAGNOSIS — N1831 Chronic kidney disease, stage 3a: Secondary | ICD-10-CM | POA: Diagnosis not present

## 2018-12-17 DIAGNOSIS — N2581 Secondary hyperparathyroidism of renal origin: Secondary | ICD-10-CM | POA: Diagnosis present

## 2018-12-17 DIAGNOSIS — I10 Essential (primary) hypertension: Secondary | ICD-10-CM | POA: Diagnosis present

## 2018-12-17 DIAGNOSIS — U071 COVID-19: Secondary | ICD-10-CM | POA: Diagnosis not present

## 2018-12-17 DIAGNOSIS — J1282 Pneumonia due to coronavirus disease 2019: Secondary | ICD-10-CM | POA: Diagnosis present

## 2018-12-17 DIAGNOSIS — E875 Hyperkalemia: Secondary | ICD-10-CM | POA: Diagnosis present

## 2018-12-17 DIAGNOSIS — I5032 Chronic diastolic (congestive) heart failure: Secondary | ICD-10-CM | POA: Diagnosis present

## 2018-12-17 DIAGNOSIS — E7800 Pure hypercholesterolemia, unspecified: Secondary | ICD-10-CM | POA: Diagnosis present

## 2018-12-17 DIAGNOSIS — E1122 Type 2 diabetes mellitus with diabetic chronic kidney disease: Secondary | ICD-10-CM | POA: Diagnosis present

## 2018-12-17 DIAGNOSIS — Z6841 Body Mass Index (BMI) 40.0 and over, adult: Secondary | ICD-10-CM | POA: Diagnosis not present

## 2018-12-17 DIAGNOSIS — B961 Klebsiella pneumoniae [K. pneumoniae] as the cause of diseases classified elsewhere: Secondary | ICD-10-CM | POA: Diagnosis present

## 2018-12-17 DIAGNOSIS — F0280 Dementia in other diseases classified elsewhere without behavioral disturbance: Secondary | ICD-10-CM | POA: Diagnosis present

## 2018-12-17 DIAGNOSIS — E039 Hypothyroidism, unspecified: Secondary | ICD-10-CM | POA: Diagnosis present

## 2018-12-17 DIAGNOSIS — Z96653 Presence of artificial knee joint, bilateral: Secondary | ICD-10-CM | POA: Diagnosis present

## 2018-12-17 DIAGNOSIS — F419 Anxiety disorder, unspecified: Secondary | ICD-10-CM | POA: Diagnosis present

## 2018-12-17 DIAGNOSIS — I482 Chronic atrial fibrillation, unspecified: Secondary | ICD-10-CM | POA: Diagnosis present

## 2018-12-17 DIAGNOSIS — I4891 Unspecified atrial fibrillation: Secondary | ICD-10-CM

## 2018-12-17 DIAGNOSIS — N3000 Acute cystitis without hematuria: Secondary | ICD-10-CM | POA: Diagnosis present

## 2018-12-17 DIAGNOSIS — Z7982 Long term (current) use of aspirin: Secondary | ICD-10-CM

## 2018-12-17 DIAGNOSIS — Z79899 Other long term (current) drug therapy: Secondary | ICD-10-CM

## 2018-12-17 DIAGNOSIS — J1289 Other viral pneumonia: Secondary | ICD-10-CM | POA: Diagnosis not present

## 2018-12-17 DIAGNOSIS — G3183 Dementia with Lewy bodies: Secondary | ICD-10-CM | POA: Diagnosis present

## 2018-12-17 LAB — CBC WITH DIFFERENTIAL/PLATELET
Abs Immature Granulocytes: 0.76 10*3/uL — ABNORMAL HIGH (ref 0.00–0.07)
Basophils Absolute: 0.1 10*3/uL (ref 0.0–0.1)
Basophils Relative: 0 %
Eosinophils Absolute: 0 10*3/uL (ref 0.0–0.5)
Eosinophils Relative: 0 %
HCT: 49.6 % — ABNORMAL HIGH (ref 36.0–46.0)
Hemoglobin: 16.5 g/dL — ABNORMAL HIGH (ref 12.0–15.0)
Immature Granulocytes: 4 %
Lymphocytes Relative: 8 %
Lymphs Abs: 1.6 10*3/uL (ref 0.7–4.0)
MCH: 29.4 pg (ref 26.0–34.0)
MCHC: 33.3 g/dL (ref 30.0–36.0)
MCV: 88.4 fL (ref 80.0–100.0)
Monocytes Absolute: 0.9 10*3/uL (ref 0.1–1.0)
Monocytes Relative: 5 %
Neutro Abs: 16.2 10*3/uL — ABNORMAL HIGH (ref 1.7–7.7)
Neutrophils Relative %: 83 %
Platelets: 243 10*3/uL (ref 150–400)
RBC: 5.61 MIL/uL — ABNORMAL HIGH (ref 3.87–5.11)
RDW: 15.3 % (ref 11.5–15.5)
WBC: 19.5 10*3/uL — ABNORMAL HIGH (ref 4.0–10.5)
nRBC: 0 % (ref 0.0–0.2)

## 2018-12-17 LAB — COMPREHENSIVE METABOLIC PANEL
ALT: 17 U/L (ref 0–44)
AST: 25 U/L (ref 15–41)
Albumin: 3.5 g/dL (ref 3.5–5.0)
Alkaline Phosphatase: 50 U/L (ref 38–126)
Anion gap: 20 — ABNORMAL HIGH (ref 5–15)
BUN: 80 mg/dL — ABNORMAL HIGH (ref 8–23)
CO2: 25 mmol/L (ref 22–32)
Calcium: 9.5 mg/dL (ref 8.9–10.3)
Chloride: 92 mmol/L — ABNORMAL LOW (ref 98–111)
Creatinine, Ser: 3.63 mg/dL — ABNORMAL HIGH (ref 0.44–1.00)
GFR calc Af Amer: 13 mL/min — ABNORMAL LOW (ref >60)
GFR calc non Af Amer: 11 mL/min — ABNORMAL LOW (ref >60)
Glucose, Bld: 257 mg/dL — ABNORMAL HIGH (ref 70–99)
Potassium: 5.7 mmol/L — ABNORMAL HIGH (ref 3.5–5.1)
Sodium: 137 mmol/L (ref 135–145)
Total Bilirubin: 1.3 mg/dL — ABNORMAL HIGH (ref 0.3–1.2)
Total Protein: 8.2 g/dL — ABNORMAL HIGH (ref 6.5–8.1)

## 2018-12-17 LAB — URINALYSIS, ROUTINE W REFLEX MICROSCOPIC
Bilirubin Urine: NEGATIVE
Glucose, UA: NEGATIVE mg/dL
Hgb urine dipstick: NEGATIVE
Ketones, ur: NEGATIVE mg/dL
Nitrite: NEGATIVE
Protein, ur: 30 mg/dL — AB
Specific Gravity, Urine: 1.018 (ref 1.005–1.030)
WBC, UA: >50 WBC/hpf — ABNORMAL HIGH (ref 0–5)
pH: 5 (ref 5.0–8.0)

## 2018-12-17 LAB — SARS CORONAVIRUS 2 BY RT PCR (HOSPITAL ORDER, PERFORMED IN ~~LOC~~ HOSPITAL LAB): SARS Coronavirus 2: POSITIVE — AB

## 2018-12-17 LAB — FERRITIN: Ferritin: 790 ng/mL — ABNORMAL HIGH (ref 11–307)

## 2018-12-17 LAB — BRAIN NATRIURETIC PEPTIDE: B Natriuretic Peptide: 268 pg/mL — ABNORMAL HIGH (ref 0.0–100.0)

## 2018-12-17 LAB — MAGNESIUM: Magnesium: 2.4 mg/dL (ref 1.7–2.4)

## 2018-12-17 LAB — T4, FREE: Free T4: 1.89 ng/dL — ABNORMAL HIGH (ref 0.61–1.12)

## 2018-12-17 LAB — LACTIC ACID, PLASMA
Lactic Acid, Venous: 3.2 mmol/L (ref 0.5–1.9)
Lactic Acid, Venous: 2.3 mmol/L (ref 0.5–1.9)

## 2018-12-17 LAB — C-REACTIVE PROTEIN: CRP: 2.9 mg/dL — ABNORMAL HIGH (ref <1.0)

## 2018-12-17 LAB — LACTATE DEHYDROGENASE: LDH: 250 U/L — ABNORMAL HIGH (ref 98–192)

## 2018-12-17 LAB — TROPONIN I (HIGH SENSITIVITY)
Troponin I (High Sensitivity): 119 ng/L (ref <18)
Troponin I (High Sensitivity): 105 ng/L (ref <18)

## 2018-12-17 LAB — FIBRINOGEN: Fibrinogen: 686 mg/dL — ABNORMAL HIGH (ref 210–475)

## 2018-12-17 LAB — TRIGLYCERIDES: Triglycerides: 428 mg/dL — ABNORMAL HIGH (ref <150)

## 2018-12-17 LAB — FIBRIN DERIVATIVES D-DIMER (ARMC ONLY): Fibrin derivatives D-dimer (ARMC): 3121.74 ng/mL (FEU) — ABNORMAL HIGH (ref 0.00–499.00)

## 2018-12-17 LAB — MRSA PCR SCREENING: MRSA by PCR: NEGATIVE

## 2018-12-17 LAB — PROCALCITONIN: Procalcitonin: 0.11 ng/mL

## 2018-12-17 LAB — TSH: TSH: 1.625 u[IU]/mL (ref 0.350–4.500)

## 2018-12-17 MED ORDER — GUAIFENESIN-DM 100-10 MG/5ML PO SYRP: 10.0000 mL | ORAL_SOLUTION | ORAL | Status: DC | PRN

## 2018-12-17 MED ORDER — SODIUM CHLORIDE 0.9 % IV SOLN
INTRAVENOUS | Status: DC
  Administered 2018-12-17: 23:00:00 via INTRAVENOUS

## 2018-12-17 MED ORDER — VANCOMYCIN HCL 1.5 G IV SOLR
1500.0000 mg | Freq: Once | INTRAVENOUS | Status: AC
  Administered 2018-12-17: 12:00:00 1500 mg via INTRAVENOUS
  Filled 2018-12-17: qty 1500

## 2018-12-17 MED ORDER — ENOXAPARIN SODIUM 80 MG/0.8ML ~~LOC~~ SOLN: 0.5000 mg/kg | Freq: Two times a day (BID) | SUBCUTANEOUS | Status: DC

## 2018-12-17 MED ORDER — VANCOMYCIN HCL IN DEXTROSE 1-5 GM/200ML-% IV SOLN
1000.0000 mg | Freq: Once | INTRAVENOUS | Status: AC
  Administered 2018-12-17: 11:00:00 1000 mg via INTRAVENOUS
  Filled 2018-12-17: qty 200

## 2018-12-17 MED ORDER — SODIUM CHLORIDE 0.9 % IV BOLUS
500.0000 mL | Freq: Once | INTRAVENOUS | Status: AC
  Administered 2018-12-17: 500 mL via INTRAVENOUS

## 2018-12-17 MED ORDER — DEXAMETHASONE 6 MG PO TABS
6.0000 mg | ORAL_TABLET | ORAL | Status: DC
  Administered 2018-12-17 – 2018-12-23 (×7): 6 mg via ORAL
  Filled 2018-12-17 (×7): qty 1

## 2018-12-17 MED ORDER — VITAMIN C 500 MG PO TABS
500.0000 mg | ORAL_TABLET | Freq: Every day | ORAL | Status: DC
  Administered 2018-12-18 – 2018-12-23 (×6): 500 mg via ORAL
  Filled 2018-12-17 (×6): qty 1

## 2018-12-17 MED ORDER — ONDANSETRON HCL 4 MG PO TABS: 4.0000 mg | ORAL_TABLET | Freq: Four times a day (QID) | ORAL | Status: DC | PRN

## 2018-12-17 MED ORDER — SODIUM CHLORIDE 0.9 % IV SOLN
1.0000 g | INTRAVENOUS | Status: AC
  Administered 2018-12-17 – 2018-12-20 (×4): 1 g via INTRAVENOUS
  Filled 2018-12-17 (×5): qty 10

## 2018-12-17 MED ORDER — HEPARIN SODIUM (PORCINE) 10000 UNIT/ML IJ SOLN
10000.0000 [IU] | Freq: Three times a day (TID) | INTRAMUSCULAR | Status: DC
  Administered 2018-12-17 – 2018-12-18 (×2): 10000 [IU] via SUBCUTANEOUS
  Filled 2018-12-17 (×2): qty 1

## 2018-12-17 MED ORDER — SODIUM CHLORIDE 0.9 % IV SOLN
2.0000 g | Freq: Once | INTRAVENOUS | Status: AC
  Administered 2018-12-17: 10:00:00 2 g via INTRAVENOUS
  Filled 2018-12-17: qty 2

## 2018-12-17 MED ORDER — HYDROCOD POLST-CPM POLST ER 10-8 MG/5ML PO SUER
5.0000 mL | Freq: Two times a day (BID) | ORAL | Status: DC | PRN
  Administered 2018-12-18 – 2018-12-19 (×2): 5 mL via ORAL
  Filled 2018-12-17 (×2): qty 5

## 2018-12-17 MED ORDER — ACETAMINOPHEN 325 MG PO TABS: 650.0000 mg | ORAL_TABLET | Freq: Four times a day (QID) | ORAL | Status: DC | PRN

## 2018-12-17 MED ORDER — ZINC SULFATE 220 (50 ZN) MG PO CAPS
220.0000 mg | ORAL_CAPSULE | Freq: Every day | ORAL | Status: DC
  Administered 2018-12-18 – 2018-12-23 (×6): 220 mg via ORAL
  Filled 2018-12-17 (×6): qty 1

## 2018-12-17 MED ORDER — DEXAMETHASONE SODIUM PHOSPHATE 10 MG/ML IJ SOLN
6.0000 mg | Freq: Once | INTRAMUSCULAR | Status: AC
  Administered 2018-12-17: 6 mg via INTRAVENOUS
  Filled 2018-12-17: qty 1

## 2018-12-17 MED ORDER — ONDANSETRON HCL 4 MG/2ML IJ SOLN
4.0000 mg | Freq: Four times a day (QID) | INTRAMUSCULAR | Status: DC | PRN
  Administered 2018-12-19: 4 mg via INTRAVENOUS
  Filled 2018-12-17: qty 2

## 2018-12-17 NOTE — H&P (Signed)
History and Physical    Deborah [name] [facility] ZOX:[id number] DOB: [birth year] DOA: 12/17/2018  PCP: Deborah Carbon, MD  Patient coming from: snf  Chief Complaint:  sob  HPI: Deborah [name] is a 85 y.o. female with medical history significant of obesity, ckd, chf, dm sent from snf for sob and respiratory distress.  covid pos on 10/18.  Progressive weakness and dec in mobility.  Denies any n/v/d.  On arrival o2 was in the 70s since on 2 liters and feels better.  Currently comfortable on [place] .  Referred for admit for covid pna.  Review of Systems: As per HPI otherwise 10 point review of systems negative.   Past Medical History:  Diagnosis Date  . 'light-for-dates' infant with signs of fetal malnutrition   . Anxiety   . Chronic kidney disease, stage III (moderate)   . Contracture of palmar fascia   . Cough   . Diabetes mellitus 2019   all diabetic meds stopped upon arrival to [facility]  . Hyperlipidemia   . Hypertension   . Lower extremity edema   . Obesity   . Osteoarthritis   . Other specified acquired hypothyroidism   . Otitis externa   . Palpitations   . Peripheral neuropathy   . Pure hypercholesterolemia   . Secondary hyperparathyroidism (of renal origin)   . Sleep apnea   . Ulcerative colitis ([place])   . Unspecified adjustment reaction   . Unspecified hereditary and idiopathic peripheral neuropathy   . Unspecified urinary incontinence     Past Surgical History:  Procedure Laterality Date  . BACK SURGERY  1990   Ruptured Disc.  Marland Kitchen CARDIAC CATHETERIZATION  03/2008   due to chest pain  . COLONOSCOPY    . COLONOSCOPY WITH PROPOFOL N/A 10/02/2016   Procedure: COLONOSCOPY WITH PROPOFOL;  Surgeon: Deborah Lame, MD;  Location: [facility]COPY;  Service: Endoscopy;  Laterality: N/A;  . DUPUYTREN CONTRACTURE RELEASE Left 05/27/2017   Procedure: DUPUYTREN CONTRACTURE RELEASE-LEFT INDEX AND PINKY FINGER;  Surgeon: Deborah Kail, MD;  Location: [facility] ORS;  Service: Orthopedics;   Laterality: Left;  . EYE SURGERY Bilateral 2014   cataract extractions with IOL implant  . HAND SURGERY Right 1999   dupeytren release  . REPLACEMENT TOTAL KNEE Bilateral 2004, 1999  . SHOULDER SURGERY Right 06/2007   rotator cuff repair     reports that she quit smoking about 20 years ago. Her smoking use included cigarettes. She has a 10.00 pack-year smoking history. She has never used smokeless tobacco. She reports that she does not drink alcohol or use drugs.  Allergies  Allergen Reactions  . Ace Inhibitors Cough    Family History  Problem Relation Age of Onset  . Heart attack Father   . Hypertension Sister   . Hypertension Sister   . Alzheimer's disease Mother     Prior to Admission medications   Medication Sig Start Date End Date Taking? Authorizing Provider  acetaminophen (TYLENOL) 500 MG tablet Take 500 mg by mouth every 6 (six) hours as needed for mild pain.     [provider]  amLODipine (NORVASC) 5 MG tablet Take 5 mg by mouth at bedtime.    [provider]  aspirin 81 MG chewable tablet Chew 81 mg by mouth daily.    [provider]  azithromycin (ZITHROMAX) 250 MG tablet Take 250-500 mg by mouth as directed. 12/13/18   [provider]  budesonide (ENTOCORT EC) 3 MG 24 hr capsule Take 3 mg by  mouth daily.     [provider]  carvedilol (COREG) 3.125 MG tablet Take 1 tablet (3.125 mg total) by mouth 2 (two) times daily. 02/21/18 02/21/19  Deborah Basta, MD  cephALEXin (KEFLEX) 500 MG capsule  10/21/18   [provider]  cholestyramine Lucrezia Starch) 4 GM/DOSE powder Take 4 g by mouth daily.     [provider]  Cranberry 425 MG CAPS Take 425 mg by mouth daily at 6 (six) AM.    [provider]  dexamethasone (DECADRON) 6 MG tablet Take 6 mg by mouth daily. 12/08/18   [provider]  escitalopram (LEXAPRO) 10 MG tablet Take 10 mg by mouth daily.    [provider]  furosemide  (LASIX) 40 MG tablet Take 40 mg by mouth daily as needed for fluid or edema.     [provider]  glipiZIDE (GLUCOTROL) 5 MG tablet Take 2.5 mg by mouth 2 (two) times daily before a meal.  08/29/18   [provider]  guaifenesin (ROBITUSSIN) 100 MG/5ML syrup Take 200 mg by mouth every 4 (four) hours as needed for cough.     [provider]  imipramine (TOFRANIL) 25 MG tablet Take 25 mg by mouth at bedtime.    [provider]  ipratropium-albuterol (DUONEB) 0.5-2.5 (3) MG/3ML SOLN Take 3 mLs by nebulization every 8 (eight) hours as needed (shortness of breath).    [provider]  loperamide (IMODIUM A-D) 2 MG tablet Take 1 tablet (2 mg total) by mouth 4 (four) times daily as needed for diarrhea or loose stools. 10/16/16   Deborah Lame, MD  loratadine (CLARITIN) 10 MG tablet Take 10 mg by mouth every evening.    [provider]  LORazepam (ATIVAN) 0.5 MG tablet Take 0.5 mg by mouth every 12 (twelve) hours as needed for anxiety.  09/11/13   [provider]  mirabegron ER (MYRBETRIQ) 50 MG TB24 tablet Take 1 tablet (50 mg total) by mouth daily. Patient not taking: Reported on 12/17/2018 07/14/18   Deborah Loser, MD  nitrofurantoin (MACRODANTIN) 100 MG capsule Take 1 capsule (100 mg total) by mouth 2 (two) times daily. Patient not taking: Reported on 12/17/2018 09/01/18   Deborah Loser, MD  nitrofurantoin, macrocrystal-monohydrate, (MACROBID) 100 MG capsule  09/01/18   [provider]  NORCO 5-325 MG tablet Take 1-2 tablets by mouth every 6 (six) hours as needed for up to 15 doses for moderate pain. MAXIMUM TOTAL ACETAMINOPHEN DOSE IS 4000 MG PER DAY 05/27/17   Deborah Kail, MD  nystatin (MYCOSTATIN/NYSTOP) powder Apply topically under pannus and underbilateral breast twice daily as needed for yeast rash    [provider]  oxybutynin (DITROPAN-XL) 10 MG 24 hr tablet Take 1 tablet (10 mg total) by mouth daily. 10/27/18    MacDiarmid, Nicki Reaper, MD  potassium chloride (K-DUR) 10 MEQ tablet Take 1 tablet (10 mEq total) by mouth daily. Patient not taking: Reported on 12/17/2018 02/21/18   Deborah Basta, MD  potassium chloride SA (K-DUR) 20 MEQ tablet  10/24/18   [provider]  Probiotic Product (ALIGN) 4 MG CAPS Take 1 capsule by mouth daily.    [provider]  simvastatin (ZOCOR) 20 MG tablet Take 20 mg by mouth at bedtime.  07/18/15   Margarita Rana, MD  spironolactone (ALDACTONE) 25 MG tablet Take 25 mg by mouth daily. 11/19/18   [provider]  torsemide (DEMADEX) 20 MG tablet Take 40 mg by mouth daily.     [provider]  triamcinolone lotion (KENALOG) 0.1 % Apply 1 application topically 3 (three) times daily. Apply to Bilateral Calves topically every 8 hours as needed for stasis-dermatitis    [provider]  trimethoprim (TRIMPEX) 100 MG tablet Take 1 tablet (100 mg total) by mouth daily. 10/27/18   Deborah Loser, MD    Physical Exam: There were no vitals filed for this visit.    Constitutional: NAD, calm, comfortable There were no vitals filed for this visit. Eyes: PERRL, lids and conjunctivae normal ENMT: Mucous membranes are moist. Posterior pharynx clear of any exudate or lesions.Normal dentition.  Neck: normal, supple, no masses, no thyromegaly Respiratory: clear to auscultation bilaterally, no wheezing, no crackles. Normal respiratory effort. No accessory muscle use.  Cardiovascular: Regular rate and rhythm, no murmurs / rubs / gallops. No extremity edema. 2+ pedal pulses. No carotid bruits.  Abdomen: no tenderness, no masses palpated. No hepatosplenomegaly. Bowel sounds positive.  Musculoskeletal: no clubbing / cyanosis. No joint deformity upper and lower extremities. Good ROM, no contractures. Normal muscle tone.  Skin: no rashes, lesions, ulcers. No induration Neurologic: CN 2-12 grossly intact. Sensation intact, DTR normal. Strength 5/5 in  all 4.  Psychiatric: Normal judgment and insight. Alert and oriented x 3. Normal mood.    Labs on Admission: I have personally reviewed following labs and imaging studies  CBC: Recent Labs  Lab 12/17/18 0828  WBC 19.5*  NEUTROABS 16.2*  HGB 16.5*  HCT 49.6*  MCV 88.4  PLT 865   Basic Metabolic Panel: Recent Labs  Lab 12/17/18 0946  NA 137  K 5.7*  CL 92*  CO2 25  GLUCOSE 257*  BUN 80*  CREATININE 3.63*  CALCIUM 9.5  MG 2.4   GFR: Estimated Creatinine Clearance: 15.3 mL/min (A) (by C-G formula based on SCr of 3.63 mg/dL (H)). Liver Function Tests: Recent Labs  Lab 12/17/18 0946  AST 25  ALT 17  ALKPHOS 50  BILITOT 1.3*  PROT 8.2*  ALBUMIN 3.5   No results for input(s): LIPASE, AMYLASE in the last 168 hours. No results for input(s): AMMONIA in the last 168 hours. Coagulation Profile: No results for input(s): INR, PROTIME in the last 168 hours. Cardiac Enzymes: No results for input(s): CKTOTAL, CKMB, CKMBINDEX, TROPONINI in the last 168 hours. BNP (last 3 results) No results for input(s): PROBNP in the last 8760 hours. HbA1C: No results for input(s): HGBA1C in the last 72 hours. CBG: No results for input(s): GLUCAP in the last 168 hours. Lipid Profile: Recent Labs    12/17/18 0946  TRIG 428*   Thyroid Function Tests: Recent Labs    12/17/18 0946  TSH 1.625  FREET4 1.89*   Anemia Panel: Recent Labs    12/17/18 0946  FERRITIN 790*   Urine analysis:    Component Value Date/Time   COLORURINE AMBER (A) 12/17/2018 0927   APPEARANCEUR CLOUDY (A) 12/17/2018 0927   APPEARANCEUR Clear 07/14/2018 1342   LABSPEC 1.018 12/17/2018 0927   PHURINE 5.0 12/17/2018 0927   GLUCOSEU NEGATIVE 12/17/2018 0927   HGBUR NEGATIVE 12/17/2018 0927   BILIRUBINUR NEGATIVE 12/17/2018 0927   BILIRUBINUR Negative 07/14/2018 1342   KETONESUR NEGATIVE 12/17/2018 0927   PROTEINUR 30 (A) 12/17/2018 0927   UROBILINOGEN 0.2 12/31/2014 1720   NITRITE NEGATIVE 12/17/2018  0927   LEUKOCYTESUR LARGE (A) 12/17/2018 0927   Sepsis Labs: !!!!!!!!!!!!!!!!!!!!!!!!!!!!!!!!!!!!!!!!!!!! @LABRCNTIP (procalcitonin:4,lacticidven:4) ) Recent Results (from the past 240 hour(s))  SARS Coronavirus 2 by RT PCR (hospital order, performed in [facility] hospital lab) Nasopharyngeal  Nasopharyngeal Swab     Status: Abnormal   Collection Time: 12/17/18  8:29 AM   Specimen: Nasopharyngeal Swab  Result Value Ref Range Status   SARS Coronavirus 2 POSITIVE (A) NEGATIVE Final    Comment: RESULT CALLED TO, READ BACK BY AND VERIFIED WITH: LORRIE LEMONS AT 9179 ON 12/17/2018 JJB (NOTE) If result is NEGATIVE SARS-CoV-2 target nucleic acids are NOT DETECTED. The SARS-CoV-2 RNA is generally detectable in upper and lower  respiratory specimens during the acute phase of infection. The lowest  concentration of SARS-CoV-2 viral copies this assay can detect is 250  copies / mL. A negative result does not preclude SARS-CoV-2 infection  and should not be used as the sole basis for treatment or other  patient management decisions.  A negative result may occur with  improper specimen collection / handling, submission of specimen other  than nasopharyngeal swab, presence of viral mutation(s) within the  areas targeted by this assay, and inadequate number of viral copies  (<250 copies / mL). A negative result must be combined with clinical  observations, patient history, and epidemiological information. If result is POSITIVE SARS-CoV-2 target nucleic acids are DETECTED.  The SARS-CoV-2 RNA is generally detectable in upper and lower  respiratory specimens during the acute phase of infection.  Positive  results are indicative of active infection with SARS-CoV-2.  Clinical  correlation with patient history and other diagnostic information is  necessary to determine patient infection status.  Positive results do  not rule out bacterial infection or co-infection with other viruses. If result is  PRESUMPTIVE POSTIVE SARS-CoV-2 nucleic acids MAY BE PRESENT.   A presumptive positive result was obtained on the submitted specimen  and confirmed on repeat testing.  While 2019 novel coronavirus  (SARS-CoV-2) nucleic acids may be present in the submitted sample  additional confirmatory testing may be necessary for epidemiological  and / or clinical management purposes  to differentiate between  SARS-CoV-2 and other Sarbecovirus currently known to infect humans.  If clinically indicated additional testing with an alternate test  methodology [phone])  is advised. The SARS-CoV-2 RNA is generally  detectable in upper and lower respiratory specimens during the acute  phase of infection. The expected result is Negative. Fact Sheet for Patients:  StrictlyIdeas.no Fact Sheet for Healthcare Providers: BankingDealers.co.za This test is not yet approved or cleared by the [place] FDA and has been authorized for detection and/or diagnosis of SARS-CoV-2 by FDA under an Emergency Use Authorization (EUA).  This EUA will remain in effect (meaning this test can be used) for the duration of the COVID-19 declaration under Section 564(b)(1) of the Act, 21 U.S.C. section 360bbb-3(b)(1), unless the authorization is terminated or revoked sooner. Performed at [facility], [address] [place], [place] [address]   MRSA PCR Screening     Status: None   Collection Time: 12/17/18  1:03 PM   Specimen: Nasopharyngeal  Result Value Ref Range Status   MRSA by PCR NEGATIVE NEGATIVE Final    Comment:        The GeneXpert MRSA Assay (FDA approved for NASAL specimens only), is one component of a comprehensive MRSA colonization surveillance program. It is not intended to diagnose MRSA infection nor to guide or monitor treatment for MRSA infections. Performed at [facility], [address], [place], [place] [address]       Radiological Exams on Admission: Dg Chest [facility] 1 View  Result Date: 12/17/2018 CLINICAL DATA:  Shortness of breath, hypoxia. EXAM: PORTABLE  CHEST 1 VIEW COMPARISON:  11/30/2018 FINDINGS: Patient slightly rotated to the left. Lungs are hypoinflated and demonstrate bilateral patchy mixed interstitial airspace density. No definite effusion. Mild stable cardiomegaly. IMPRESSION: Hypoinflation with mild bilateral patchy mixed interstitial airspace density which may be due to infection. Electronically Signed   By: Marin Olp M.D.   On: 12/17/2018 08:54   cxr mild opacity Old chart reviewed   Assessment/Plan 85 yo female with covid pna Principal Problem:   Pneumonia due to COVID-19 virus- remdisivir along with decadron ordered.  Lovenox/vit c/zinc also.  Supportive care.  Comfortable on [place] at this time and seems improved , weaned of nrb for now.  Active Problems:   Urinary tract infection without hematuria- rocephin.  Follow up uc   AKI (acute kidney injury) (HCC)-cr up to 3.6.  Gentle ivf overnight and follow   Benign hypertension- stable   Chronic kidney disease (CKD), stage III (moderate)- noted with aki as above   Extreme obesity- noted   Controlled type 2 diabetes mellitus ([place])- noted   Lewy body dementia without behavioral disturbance ([place])- seems mild   DNR (do not resuscitate)- confirmed     DVT prophylaxis: heparin Code Status:  DNR Family Communication:  none  Disposition Plan: days Consults called: none Admission status:  admission   DAVID,RACHAL A MD [facility]  If 7PM-7AM, please contact night-coverage [email or web address] Password [facility]  12/17/2018, 10:24 PM

## 2018-12-17 NOTE — ED Notes (Signed)
Pt oxygen increased to 6L [place] due to pt oxygen saturation continuing to drop to 87-88%.

## 2018-12-17 NOTE — ED Provider Notes (Signed)
[facility] Emergency Department Provider Note  ____________________________________________   First MD Initiated Contact with Patient 12/17/18 [phone]     (approximate)  I have reviewed the triage vital signs and the nursing notes.   HISTORY  Chief Complaint Shortness of Breath and Fatigue    HPI Deborah [name] is a 85 y.o. female with CKD, diabetes, CHF who presents with respiratory issues.  Patient was coronavirus positive on 10/18.  Patient was initially seen in the ER and was satting 94% on room air and was discharged back to facility.  At the facility she is becoming increasingly weak, unable to get out of her wheelchair and increasingly short of breath.  On EMS arrival patient was cyanotic in her lips and her fingers.  Patient was satting in the 32s on room air.  Patient was placed on nonrebreather and transported to [place].  History is limited due to patient's shortness of breath.  Patient is DNR.     Past Medical History:  Diagnosis Date  . 'light-for-dates' infant with signs of fetal malnutrition   . Anxiety   . Chronic kidney disease, stage III (moderate)   . Contracture of palmar fascia   . Cough   . Diabetes mellitus 2019   [facility]  . Hyperlipidemia   . Hypertension   . Lower extremity edema   . Obesity   . Osteoarthritis   . Other specified acquired hypothyroidism   . Otitis externa   . Palpitations   . Peripheral neuropathy   . Pure hypercholesterolemia   . Secondary hyperparathyroidism (of renal origin)   . Sleep apnea   . Ulcerative colitis ([place])   . Unspecified adjustment reaction   . Unspecified hereditary and idiopathic peripheral neuropathy   . Unspecified urinary incontinence     Patient Active Problem List   Diagnosis Date Noted  . Pneumonia 02/19/2018  . Benign neoplasm of cecum   . Benign neoplasm of transverse colon   . Lewy body dementia without behavioral disturbance  ([place])   . Palliative care by specialist   . DNR (do not resuscitate)   . Goals of care, counseling/discussion   . Sepsis ([place]) 02/24/2016  . Urinary tract infection without hematuria 02/24/2016  . Memory loss 07/18/2015  . Depression 01/12/2015  . Shoulder pain 12/17/2014  . Pain in shoulder 08/05/2014  . Gonalgia 07/21/2014  . History of knee surgery 07/21/2014  . Allergic rhinitis 07/16/2014  . Anxiety 07/16/2014  . Benign hypertension 07/16/2014  . Alteration in bowel elimination: incontinence 07/16/2014  . Chronic kidney disease (CKD), stage III (moderate) 07/16/2014  . Colitis 07/16/2014  . CN (constipation) 07/16/2014  . Diarrhea 07/16/2014  . Contracture of palmar fascia (Dupuytren's) 07/16/2014  . Hypercholesteremia 07/16/2014  . Hypoxia 07/16/2014  . Absence of bladder continence 07/16/2014  . Focal lymphocytic colitis 07/16/2014  . Extreme obesity 07/16/2014  . Arthritis, degenerative 07/16/2014  . Disorder of peripheral nervous system 07/16/2014  . Neuralgia neuritis, sciatic nerve 07/16/2014  . Hyperparathyroidism, secondary ([place]) 07/16/2014  . Apnea, sleep 07/16/2014  . Subclinical hypothyroidism 07/16/2014  . Controlled type 2 diabetes mellitus ([place]) 07/16/2014  . 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 Urinary incontinence without sensory awareness 09/03/2012  . Palpitations 12/28/2010  . SOB (shortness of breath) 12/28/2010  . Overweight 08/02/2009    Past Surgical History:  Procedure Laterality Date  . BACK SURGERY  1990   Ruptured Disc.  Marland Kitchen CARDIAC CATHETERIZATION  03/2008  due to chest pain  . COLONOSCOPY    . COLONOSCOPY WITH PROPOFOL N/A 10/02/2016   Procedure: COLONOSCOPY WITH PROPOFOL;  Surgeon: Lucilla Lame, MD;  Location: [facility] ENDOSCOPY;  Service: Endoscopy;  Laterality: N/A;  . DUPUYTREN CONTRACTURE RELEASE Left 05/27/2017   Procedure: DUPUYTREN CONTRACTURE RELEASE-LEFT INDEX AND PINKY FINGER;  Surgeon: Leanor Kail, MD;  Location: [facility] ORS;   Service: Orthopedics;  Laterality: Left;  . EYE SURGERY Bilateral 2014   cataract extractions with IOL implant  . HAND SURGERY Right 1999   dupeytren release  . REPLACEMENT TOTAL KNEE Bilateral 2004, 1999  . SHOULDER SURGERY Right 06/2007   rotator cuff repair    Prior to Admission medications   Medication Sig Start Date End Date Taking? Authorizing Provider  acetaminophen (TYLENOL) 500 MG tablet Take 500 mg by mouth every 6 (six) hours as needed for mild pain.     [provider]  amLODipine (NORVASC) 5 MG tablet Take 5 mg by mouth at bedtime.    [provider]  aspirin 81 MG chewable tablet Chew 81 mg by mouth daily.    [provider]  budesonide (ENTOCORT EC) 3 MG 24 hr capsule Take by mouth daily.    [provider]  carvedilol (COREG) 3.125 MG tablet Take 1 tablet (3.125 mg total) by mouth 2 (two) times daily. 02/21/18 02/21/19  Vaughan Basta, MD  cephALEXin (KEFLEX) 500 MG capsule  10/21/18   [provider]  cholestyramine Lucrezia Starch) 4 GM/DOSE powder Take 4 g by mouth daily.     [provider]  Cranberry 425 MG CAPS Take 425 mg by mouth daily at 6 (six) AM.    [provider]  escitalopram (LEXAPRO) 10 MG tablet Take 10 mg by mouth daily.    [provider]  furosemide (LASIX) 40 MG tablet Take 40 mg by mouth daily as needed for fluid or edema.     [provider]  glipiZIDE (GLUCOTROL) 5 MG tablet  08/29/18   [provider]  guaifenesin (ROBITUSSIN) 100 MG/5ML syrup Take by mouth.    [provider]  imipramine (TOFRANIL) 25 MG tablet Take 25 mg by mouth at bedtime.    [provider]  ipratropium-albuterol (DUONEB) 0.5-2.5 (3) MG/3ML SOLN Take 3 mLs by nebulization every 8 (eight) hours as needed (shortness of breath).    [provider]  loperamide (IMODIUM A-D) 2 MG tablet Take 1 tablet (2 mg total) by mouth 4 (four) times daily as needed for diarrhea or  loose stools. 10/16/16   Lucilla Lame, MD  loratadine (CLARITIN) 10 MG tablet Take 10 mg by mouth every evening.    [provider]  LORazepam (ATIVAN) 0.5 MG tablet Take by mouth. 09/11/13   [provider]  mirabegron ER (MYRBETRIQ) 50 MG TB24 tablet Take 1 tablet (50 mg total) by mouth daily. 07/14/18   Bjorn Loser, MD  nitrofurantoin (MACRODANTIN) 100 MG capsule Take 1 capsule (100 mg total) by mouth 2 (two) times daily. 09/01/18   Bjorn Loser, MD  nitrofurantoin, macrocrystal-monohydrate, (MACROBID) 100 MG capsule  09/01/18   [provider]  NORCO 5-325 MG tablet Take 1-2 tablets by mouth every 6 (six) hours as needed for up to 15 doses for moderate pain. MAXIMUM TOTAL ACETAMINOPHEN DOSE IS 4000 MG PER DAY 05/27/17   Leanor Kail, MD  nystatin (MYCOSTATIN/NYSTOP) powder Apply topically under pannus and underbilateral breast twice daily as needed for yeast rash    [provider]  oxybutynin (  DITROPAN-XL) 10 MG 24 hr tablet Take 1 tablet (10 mg total) by mouth daily. 10/27/18   MacDiarmid, Nicki Reaper, MD  potassium chloride (K-DUR) 10 MEQ tablet Take 1 tablet (10 mEq total) by mouth daily. Patient taking differently: Take 20 mEq by mouth daily.  02/21/18   Vaughan Basta, MD  potassium chloride SA (K-DUR) 20 MEQ tablet  10/24/18   [provider]  Probiotic Product (ALIGN) 4 MG CAPS Take 1 capsule by mouth daily.    [provider]  simvastatin (ZOCOR) 20 MG tablet Take 20 mg by mouth at bedtime.  07/18/15   Margarita Rana, MD  torsemide (DEMADEX) 10 MG tablet Take 20 mg by mouth daily.    [provider]  triamcinolone lotion (KENALOG) 0.1 % Apply 1 application topically 3 (three) times daily. Apply to Bilateral Calves topically every 8 hours as needed for stasis-dermatitis    [provider]  trimethoprim (TRIMPEX) 100 MG tablet Take 1 tablet (100 mg total) by mouth daily. 10/27/18   Bjorn Loser, MD     Allergies Ace inhibitors  Family History  Problem Relation Age of Onset  . Heart attack Father   . Hypertension Sister   . Hypertension Sister   . Alzheimer's disease Mother     Social History Social History   Tobacco Use  . Smoking status: Former Smoker    Packs/day: 1.00    Years: 10.00    Pack years: 10.00    Types: Cigarettes    Quit date: 12/27/1997    Years since quitting: 20.9  . Smokeless tobacco: Never Used  Substance Use Topics  . Alcohol use: No  . Drug use: No      Review of Systems Unable to get full review system due to patient's shortness of breath. ____________________________________________   PHYSICAL EXAM:  VITAL SIGNS: Blood pressure 101/64, pulse 89, temperature (!) 96.7 F (35.9 C), temperature source Axillary, resp. rate 20, height 5' 2"  (1.575 m), weight (!) 139 kg, SpO2 98 %.   Constitutional: Alert and looking around the room but appears short of breath. Eyes: Conjunctivae are normal. EOMI. Head: Atraumatic. Nose: No congestion/rhinnorhea. Mouth/Throat: Mucous membranes are moist.   Neck: No stridor. Trachea Midline. FROM Cardiovascular: Normal rate, regular rhythm. Grossly normal heart sounds.  Good peripheral circulation. Respiratory: Increased work of breathing, no stridor. Gastrointestinal: Soft and nontender. No distention. No abdominal bruits.  Musculoskeletal: No lower extremity tenderness nor edema.  No joint effusions. Neurologic:  Normal speech and language. No gross focal neurologic deficits are appreciated.  Skin:  Skin is warm, dry and intact. No rash noted. Psychiatric: difficult to fully access  GU: Deferred   ____________________________________________   LABS (all labs ordered are listed, but only abnormal results are displayed)  Labs Reviewed  SARS CORONAVIRUS 2 BY RT PCR ([facility]) - Abnormal; Notable for the following components:      Result Value   SARS  Coronavirus 2 POSITIVE (*)    All other components within normal limits  LACTIC ACID, PLASMA - Abnormal; Notable for the following components:   Lactic Acid, Venous 2.3 (*)    All other components within normal limits  CBC WITH DIFFERENTIAL/PLATELET - Abnormal; Notable for the following components:   WBC 19.5 (*)    RBC 5.61 (*)    Hemoglobin 16.5 (*)    HCT 49.6 (*)    Neutro Abs 16.2 (*)    Abs Immature Granulocytes 0.76 (*)  All other components within normal limits  C-REACTIVE PROTEIN - Abnormal; Notable for the following components:   CRP 2.9 (*)    All other components within normal limits  URINALYSIS, ROUTINE W REFLEX MICROSCOPIC - Abnormal; Notable for the following components:   Color, Urine AMBER (*)    APPearance CLOUDY (*)    Protein, ur 30 (*)    Leukocytes,Ua LARGE (*)    WBC, UA >50 (*)    Bacteria, UA MANY (*)    All other components within normal limits  COMPREHENSIVE METABOLIC PANEL - Abnormal; Notable for the following components:   Potassium 5.7 (*)    Chloride 92 (*)    Glucose, Bld 257 (*)    BUN 80 (*)    Creatinine, Ser 3.63 (*)    Total Protein 8.2 (*)    Total Bilirubin 1.3 (*)    GFR calc non Af Amer 11 (*)    GFR calc Af Amer 13 (*)    Anion gap 20 (*)    All other components within normal limits  FERRITIN - Abnormal; Notable for the following components:   Ferritin 790 (*)    All other components within normal limits  T4, FREE - Abnormal; Notable for the following components:   Free T4 1.89 (*)    All other components within normal limits  LACTATE DEHYDROGENASE - Abnormal; Notable for the following components:   LDH 250 (*)    All other components within normal limits  TRIGLYCERIDES - Abnormal; Notable for the following components:   Triglycerides 428 (*)    All other components within normal limits  FIBRIN DERIVATIVES D-DIMER ([facility] ONLY) - Abnormal; Notable for the following components:   Fibrin derivatives D-dimer ([facility]) 3,121.74 (*)     All other components within normal limits  FIBRINOGEN - Abnormal; Notable for the following components:   Fibrinogen 686 (*)    All other components within normal limits  LACTIC ACID, PLASMA - Abnormal; Notable for the following components:   Lactic Acid, Venous 3.2 (*)    All other components within normal limits  BRAIN NATRIURETIC PEPTIDE - Abnormal; Notable for the following components:   B Natriuretic Peptide 268.0 (*)    All other components within normal limits  TROPONIN I (HIGH SENSITIVITY) - Abnormal; Notable for the following components:   Troponin I (High Sensitivity) 119 (*)    All other components within normal limits  TROPONIN I (HIGH SENSITIVITY) - Abnormal; Notable for the following components:   Troponin I (High Sensitivity) 105 (*)    All other components within normal limits  CULTURE, BLOOD (ROUTINE X 2)  CULTURE, BLOOD (ROUTINE X 2)  MRSA PCR SCREENING  URINE CULTURE  MAGNESIUM  PROCALCITONIN  TSH   ____________________________________________   ED ECG REPORT I, Vanessa Carpenter, the attending physician, personally viewed and interpreted this ECG.  EKG is atrial fibrillation rate of 109, no ST elevation, no T wave inversions, normal intervals ____________________________________________  RADIOLOGY Robert Bellow, personally viewed and evaluated these images (plain radiographs) as part of my medical decision making, as well as reviewing the written report by the radiologist.  ED MD interpretation: Concerning for bilateral interstitial densities.  Official radiology report(s): Dg Chest Port 1 View  Result Date: 12/17/2018 CLINICAL DATA:  Shortness of breath, hypoxia. EXAM: PORTABLE CHEST 1 VIEW COMPARISON:  11/30/2018 FINDINGS: Patient slightly rotated to the left. Lungs are hypoinflated and demonstrate bilateral patchy mixed interstitial airspace density. No definite effusion. Mild stable cardiomegaly. IMPRESSION:  Hypoinflation with mild bilateral patchy  mixed interstitial airspace density which may be due to infection. Electronically Signed   By: Marin Olp M.D.   On: 12/17/2018 08:54    ____________________________________________   PROCEDURES  Procedure(s) performed (including Critical Care):  .Critical Care Performed by: Vanessa [place], MD Authorized by: Vanessa [place], MD   Critical care provider statement:    Critical care time (minutes):  31   Critical care was necessary to treat or prevent imminent or life-threatening deterioration of the following conditions:  Respiratory failure   Critical care was time spent personally by me on the following activities:  Discussions with consultants, evaluation of patient's response to treatment, examination of patient, ordering and performing treatments and interventions, ordering and review of laboratory studies, ordering and review of radiographic studies, pulse oximetry, re-evaluation of patient's condition, obtaining history from patient or surrogate and review of old charts     ____________________________________________   INITIAL IMPRESSION / [place] / ED COURSE   ADORIA KAWAMOTO was evaluated in Emergency Department on 12/17/2018 for the symptoms described in the history of present illness. She was evaluated in the context of the global COVID-19 pandemic, which necessitated consideration that the patient might be at risk for infection with the SARS-CoV-2 virus that causes COVID-19. Institutional protocols and algorithms that pertain to the evaluation of patients at risk for COVID-19 are in a state of rapid change based on information released by regulatory bodies including the CDC and federal and state organizations. These policies and algorithms were followed during the patient's care in the ED.    Patient desatted down to 84% on room air.  Patient started on a few liters of oxygen.  Patient is EKG also shows new atrial fibrillation currently rate controlled.  No prior  history of this.  Will check out electrolytes as well as thyroid levels.  Patient is over 2 weeks from the initial positive Covid currently at day 18 but given comorbidies this could be from Covid.   Pt presents with SOB. Differential includes: PNA-will get xray to evaluation Anemia-CBC to evaluate ACS- will get trops Arrhythmia-Will get EKG and keep on monitor.  COVID- will get testing per algorithm. PE-lower suspicion given no risk factors and other cause more likely consider if a negative work-up.   10:08 AM patient increased to 6 L.  Given significant oxygen requirements with elevated white count will cover for pneumonia with Vanco and cefepime.   Patient's coronavirus test is positive.  UA concerning for UTI.  Patient with new renal dysfunction with a kidney of 3.6 and potassium of 5.7.  Patient given 500 cc of fluid.  Do not want to fluid overload with coronavirus.  Initial troponin is 119 which I suspect is demand from infection in the A. fib.  Heart rates have come down on their own.  D.w Dr. Cathlean Sauer from [facility].  They will accept patient.  We discussed whether or not this could be a pulmonary embolism.  Need to hold off on CT scan given her AKI.  They would recommend holding off on heparin at this time they will evaluate her when she gets here.  Patient not hypotensive and otherwise stable on the 6 L.  2:12 PM patient remains in the 6 L satting 100%.  She is oriented to her name is able to follow simple commands like wiggling her toes.  ____________________________________________   FINAL CLINICAL IMPRESSION(S) / ED DIAGNOSES   Final diagnoses:  Acute respiratory  failure with hypoxia (HCC)  Acute cystitis without hematuria  COVID-19  AKI (acute kidney injury) ([place])  Atrial fibrillation, unspecified type ([place])     MEDICATIONS GIVEN DURING THIS VISIT:  Medications  vancomycin (VANCOCIN) IVPB 1000 mg/200 mL premix ( Intravenous Stopped 12/17/18 1130)  ceFEPIme  (MAXIPIME) 2 g in sodium chloride 0.9 % 100 mL IVPB (0 g Intravenous Stopped 12/17/18 1058)  vancomycin (VANCOCIN) 1,500 mg in sodium chloride 0.9 % 500 mL IVPB (1,500 mg Intravenous New Bag/Given 12/17/18 1150)  sodium chloride 0.9 % bolus 500 mL (500 mLs Intravenous New Bag/Given 12/17/18 1056)  dexamethasone (DECADRON) injection 6 mg (6 mg Intravenous Given 12/17/18 1301)     ED Discharge Orders    None       Note:  This document was prepared using Dragon voice recognition software and may include unintentional dictation errors.   Vanessa [place], MD 12/17/18 1415

## 2018-12-17 NOTE — Progress Notes (Signed)
Spoke with patient's husband, updated him on patient's condition. All questions and concerns addressed.

## 2018-12-17 NOTE — ED Notes (Signed)
Alina Gilkey (husband) [phone]

## 2018-12-17 NOTE — Consult Note (Signed)
PHARMACY -  BRIEF ANTIBIOTIC NOTE   Pharmacy has received consult(s) for cefepime and vancomycin from an ED provider.  The patient's profile has been reviewed for ht/wt/allergies/indication/available labs.    Patient [facility]IBEMS from [facility] with hypoxia in setting of positive COVID-19 test.    One time order(s) placed for -cefepime 2 g already ordered -vancomycin 2.5 g (1g + 1.5 g to complete LD)  Further antibiotics/pharmacy consults should be ordered by admitting physician if indicated.                       Thank you, [place] Resident 12/17/2018  10:24 AM

## 2018-12-17 NOTE — ED Notes (Signed)
Lorrie, RN reported that her and 2 EDT's repositioned pt onto left side ,and ensured that pt was clean and dry with suction canister in place at @1830 

## 2018-12-17 NOTE — ED Notes (Signed)
EMTALA completed, VS completed, signature completed.

## 2018-12-17 NOTE — ED Triage Notes (Signed)
pt arrive via ems from [facility], ems reports pt SOB with low oxygen saturation. ems reports facility stated pt normally can stand to get out of wheel chair and could not last, staff reported still in wheel chair this am but oxygen sat was in 70% ems initial was 79%, increased to 93% on non rebreather. Pt diagnosed on 10/18 with covid. pt alert and oriented but delayed responses and appears SOB, trouble following directions. ems states HR irregular 110. MD present on arrival for assessment

## 2018-12-17 NOTE — ED Notes (Signed)
Suction cath placed on pt due to urinary incontinents. Redness noted on pt sacral region with closed blister noted on right side when taking rectal temp.

## 2018-12-18 ENCOUNTER — Telehealth: Payer: Self-pay | Admitting: Internal Medicine

## 2018-12-18 ENCOUNTER — Encounter (HOSPITAL_COMMUNITY): Payer: Self-pay

## 2018-12-18 LAB — COMPREHENSIVE METABOLIC PANEL
ALT: 19 U/L (ref 0–44)
AST: 13 U/L — ABNORMAL LOW (ref 15–41)
Albumin: 2.6 g/dL — ABNORMAL LOW (ref 3.5–5.0)
Alkaline Phosphatase: 43 U/L (ref 38–126)
Anion gap: 13 (ref 5–15)
BUN: 71 mg/dL — ABNORMAL HIGH (ref 8–23)
CO2: 23 mmol/L (ref 22–32)
Calcium: 8.3 mg/dL — ABNORMAL LOW (ref 8.9–10.3)
Chloride: 101 mmol/L (ref 98–111)
Creatinine, Ser: 2.51 mg/dL — ABNORMAL HIGH (ref 0.44–1.00)
GFR calc Af Amer: 19 mL/min — ABNORMAL LOW (ref >60)
GFR calc non Af Amer: 17 mL/min — ABNORMAL LOW (ref >60)
Glucose, Bld: 220 mg/dL — ABNORMAL HIGH (ref 70–99)
Potassium: 4.6 mmol/L (ref 3.5–5.1)
Sodium: 137 mmol/L (ref 135–145)
Total Bilirubin: 1.1 mg/dL (ref 0.3–1.2)
Total Protein: 5.8 g/dL — ABNORMAL LOW (ref 6.5–8.1)

## 2018-12-18 LAB — CBC WITH DIFFERENTIAL/PLATELET
Abs Immature Granulocytes: 0.14 10*3/uL — ABNORMAL HIGH (ref 0.00–0.07)
Basophils Absolute: 0 10*3/uL (ref 0.0–0.1)
Basophils Relative: 0 %
Eosinophils Absolute: 0 10*3/uL (ref 0.0–0.5)
Eosinophils Relative: 0 %
HCT: 42.2 % (ref 36.0–46.0)
Hemoglobin: 13.3 g/dL (ref 12.0–15.0)
Immature Granulocytes: 2 %
Lymphocytes Relative: 5 %
Lymphs Abs: 0.4 10*3/uL — ABNORMAL LOW (ref 0.7–4.0)
MCH: 28.9 pg (ref 26.0–34.0)
MCHC: 31.5 g/dL (ref 30.0–36.0)
MCV: 91.7 fL (ref 80.0–100.0)
Monocytes Absolute: 0.2 10*3/uL (ref 0.1–1.0)
Monocytes Relative: 3 %
Neutro Abs: 7.1 10*3/uL (ref 1.7–7.7)
Neutrophils Relative %: 90 %
Platelets: 121 10*3/uL — ABNORMAL LOW (ref 150–400)
RBC: 4.6 MIL/uL (ref 3.87–5.11)
RDW: 15 % (ref 11.5–15.5)
WBC: 7.8 10*3/uL (ref 4.0–10.5)
nRBC: 0 % (ref 0.0–0.2)

## 2018-12-18 LAB — C-REACTIVE PROTEIN: CRP: 1.8 mg/dL — ABNORMAL HIGH (ref <1.0)

## 2018-12-18 LAB — D-DIMER, QUANTITATIVE: D-Dimer, Quant: 2.6 ug/mL-FEU — ABNORMAL HIGH (ref 0.00–0.50)

## 2018-12-18 LAB — MRSA PCR SCREENING: MRSA by PCR: NEGATIVE

## 2018-12-18 LAB — SAMPLE TO BLOOD BANK

## 2018-12-18 LAB — GLUCOSE, CAPILLARY: Glucose-Capillary: 195 mg/dL — ABNORMAL HIGH (ref 70–99)

## 2018-12-18 LAB — LACTIC ACID, PLASMA: Lactic Acid, Venous: 1.1 mmol/L (ref 0.5–1.9)

## 2018-12-18 MED ORDER — SODIUM CHLORIDE 0.9 % IV BOLUS
500.0000 mL | Freq: Once | INTRAVENOUS | Status: AC
  Administered 2018-12-18: 08:00:00 500 mL via INTRAVENOUS

## 2018-12-18 MED ORDER — BUDESONIDE 3 MG PO CPEP: 3.0000 mg | ORAL_CAPSULE | Freq: Every day | ORAL | Status: DC

## 2018-12-18 MED ORDER — ESCITALOPRAM OXALATE 10 MG PO TABS
10.0000 mg | ORAL_TABLET | Freq: Every day | ORAL | Status: DC
  Administered 2018-12-18 – 2018-12-23 (×6): 10 mg via ORAL
  Filled 2018-12-18 (×6): qty 1

## 2018-12-18 MED ORDER — LORAZEPAM 0.5 MG PO TABS
0.5000 mg | ORAL_TABLET | Freq: Two times a day (BID) | ORAL | Status: DC | PRN
  Administered 2018-12-19 – 2018-12-21 (×2): 0.5 mg via ORAL
  Filled 2018-12-18 (×2): qty 1

## 2018-12-18 MED ORDER — CHOLESTYRAMINE 4 G PO PACK
4.0000 g | PACK | Freq: Every day | ORAL | Status: DC
  Administered 2018-12-18 – 2018-12-23 (×6): 4 g via ORAL
  Filled 2018-12-18 (×7): qty 1

## 2018-12-18 MED ORDER — SODIUM ZIRCONIUM CYCLOSILICATE 10 G PO PACK
10.0000 g | PACK | Freq: Every day | ORAL | Status: DC
  Administered 2018-12-18: 10 g via ORAL
  Filled 2018-12-18 (×2): qty 1

## 2018-12-18 MED ORDER — HEPARIN SODIUM (PORCINE) 10000 UNIT/ML IJ SOLN
7500.0000 [IU] | Freq: Three times a day (TID) | INTRAMUSCULAR | Status: DC
  Administered 2018-12-18 – 2018-12-23 (×15): 7500 [IU] via SUBCUTANEOUS
  Filled 2018-12-18 (×14): qty 1

## 2018-12-18 MED ORDER — SODIUM CHLORIDE 0.9 % IV SOLN
200.0000 mg | Freq: Once | INTRAVENOUS | Status: AC
  Administered 2018-12-18: 05:00:00 200 mg via INTRAVENOUS
  Filled 2018-12-18: qty 40

## 2018-12-18 MED ORDER — LINAGLIPTIN 5 MG PO TABS
5.0000 mg | ORAL_TABLET | Freq: Every day | ORAL | Status: DC
  Administered 2018-12-18 – 2018-12-23 (×6): 5 mg via ORAL
  Filled 2018-12-18 (×7): qty 1

## 2018-12-18 MED ORDER — IMIPRAMINE HCL 25 MG PO TABS
25.0000 mg | ORAL_TABLET | Freq: Every day | ORAL | Status: DC
  Filled 2018-12-18: qty 1

## 2018-12-18 MED ORDER — OXYBUTYNIN CHLORIDE ER 10 MG PO TB24
10.0000 mg | ORAL_TABLET | Freq: Every day | ORAL | Status: DC
  Administered 2018-12-18 – 2018-12-23 (×6): 10 mg via ORAL
  Filled 2018-12-18 (×7): qty 1

## 2018-12-18 MED ORDER — ASPIRIN 81 MG PO CHEW: 81.0000 mg | CHEWABLE_TABLET | Freq: Every day | ORAL | Status: DC

## 2018-12-18 MED ORDER — SODIUM CHLORIDE 0.9 % IV SOLN
100.0000 mg | INTRAVENOUS | Status: AC
  Administered 2018-12-19 – 2018-12-22 (×4): 100 mg via INTRAVENOUS
  Filled 2018-12-18 (×4): qty 20

## 2018-12-18 MED ORDER — CARVEDILOL 3.125 MG PO TABS
3.1250 mg | ORAL_TABLET | Freq: Two times a day (BID) | ORAL | Status: DC
  Administered 2018-12-18 – 2018-12-23 (×11): 3.125 mg via ORAL
  Filled 2018-12-18 (×11): qty 1

## 2018-12-18 MED ORDER — INSULIN ASPART 100 UNIT/ML ~~LOC~~ SOLN
0.0000 [IU] | Freq: Three times a day (TID) | SUBCUTANEOUS | Status: DC
  Administered 2018-12-18: 18:00:00 3 [IU] via SUBCUTANEOUS
  Administered 2018-12-19 (×2): 5 [IU] via SUBCUTANEOUS
  Administered 2018-12-20: 13:00:00 8 [IU] via SUBCUTANEOUS
  Administered 2018-12-20 – 2018-12-21 (×4): 3 [IU] via SUBCUTANEOUS
  Administered 2018-12-21: 5 [IU] via SUBCUTANEOUS
  Administered 2018-12-22: 12:00:00 2 [IU] via SUBCUTANEOUS
  Administered 2018-12-22 (×2): 3 [IU] via SUBCUTANEOUS
  Administered 2018-12-23: 8 [IU] via SUBCUTANEOUS
  Administered 2018-12-23: 13:00:00 5 [IU] via SUBCUTANEOUS

## 2018-12-18 MED ORDER — INSULIN ASPART 100 UNIT/ML ~~LOC~~ SOLN: 0.0000 [IU] | Freq: Every day | SUBCUTANEOUS | Status: DC

## 2018-12-18 MED ORDER — SIMVASTATIN 20 MG PO TABS
20.0000 mg | ORAL_TABLET | Freq: Every day | ORAL | Status: DC
  Administered 2018-12-18 – 2018-12-22 (×5): 20 mg via ORAL
  Filled 2018-12-18 (×5): qty 1

## 2018-12-18 NOTE — TOC Initial Note (Signed)
Transition of Care [facility]) - Initial/Assessment Note    Patient Details  Name: Deborah [name] MRN: [id number] Date of Birth: 03-05-1930  [facility] of [facility]) CM/SW Contact:    Ninfa Meeker, RN Phone Number: [phone] (working remotely) 12/18/2018, 2:44 PM  Clinical Narrative:  Patient is a 85 y.o. female, resides at [facility], with medical history significant of obesity, ckd, chf, dm sent from snf for sob and respiratory distress to [facility]. She was confirmed covid pos on 10/18.  Progressive weakness and decline in mobility. Transferred to [facility] for further treatment of COVID pneumonia. Patient on 2L [place].  Case manager will continue to monitor.                        Patient Goals and CMS Choice        Expected Discharge Plan and Services                                                Prior Living Arrangements/Services                       Activities of Daily Living Home Assistive Devices/Equipment: Gilford Rile (specify type), Wheelchair ADL Screening (condition at time of admission) Patient's cognitive ability adequate to safely complete daily activities?: Yes Is the patient deaf or have difficulty hearing?: No Does the patient have difficulty seeing, even when wearing glasses/contacts?: No Does the patient have difficulty concentrating, remembering, or making decisions?: Yes Patient able to express need for assistance with ADLs?: Yes Does the patient have difficulty dressing or bathing?: Yes Independently performs ADLs?: No Does the patient have difficulty walking or climbing stairs?: Yes Weakness of Legs: Both Weakness of Arms/Hands: Both  Permission Sought/Granted                  Emotional Assessment              Admission diagnosis:  COVID 19 HYPOXIA Patient Active Problem List   Diagnosis Date Noted  . Pneumonia due to COVID-19 virus 12/17/2018  . AKI (acute kidney injury) ([place]) 12/17/2018  . Pneumonia  02/19/2018  . Benign neoplasm of cecum   . Benign neoplasm of transverse colon   . Lewy body dementia without behavioral disturbance ([place])   . Palliative care by specialist   . DNR (do not resuscitate)   . Goals of care, counseling/discussion   . Sepsis ([place]) 02/24/2016  . Urinary tract infection without hematuria 02/24/2016  . Memory loss 07/18/2015  . Depression 01/12/2015  . Shoulder pain 12/17/2014  . Pain in shoulder 08/05/2014  . Gonalgia 07/21/2014  . History of knee surgery 07/21/2014  . Allergic rhinitis 07/16/2014  . Anxiety 07/16/2014  . Benign hypertension 07/16/2014  . Alteration in bowel elimination: incontinence 07/16/2014  . Chronic kidney disease (CKD), stage III (moderate) 07/16/2014  . Colitis 07/16/2014  . CN (constipation) 07/16/2014  . Diarrhea 07/16/2014  . Contracture of palmar fascia (Dupuytren's) 07/16/2014  . Hypercholesteremia 07/16/2014  . Hypoxia 07/16/2014  . Absence of bladder continence 07/16/2014  . Focal lymphocytic colitis 07/16/2014  . Extreme obesity 07/16/2014  . Arthritis, degenerative 07/16/2014  . Disorder of peripheral nervous system 07/16/2014  . Neuralgia neuritis, sciatic nerve 07/16/2014  . Hyperparathyroidism, secondary ([place]) 07/16/2014  . Apnea, sleep 07/16/2014  . Subclinical  hypothyroidism 07/16/2014  . Controlled type 2 diabetes mellitus ([place]) 07/16/2014  . 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 Urinary incontinence without sensory awareness 09/03/2012  . Palpitations 12/28/2010  . SOB (shortness of breath) 12/28/2010  . Overweight 08/02/2009   PCP:  Venia Carbon, MD Pharmacy:  No Pharmacies Listed    Social Determinants of Health (SDOH) Interventions    Readmission Risk Interventions No flowsheet data found.

## 2018-12-18 NOTE — Progress Notes (Signed)
Paged on-call MD regarding possible PICC placement d/t phlebotomists unable to draw AM labs. No new orders received.

## 2018-12-18 NOTE — Progress Notes (Signed)
Deborah [name]  CZY:[id number] DOB: Dec 30, 1930 DOA: 12/17/2018 PCP: Venia Carbon, MD    Brief Narrative:  85 year old with a history of obesity, CKD, CHF, mild dementia, and DM who was sent to the [facility] ED from her SNF due to severe shortness of breath and respiratory distress.  She was noted to be Covid positive as of 10/18.  Upon arrival in the ED she was found to have an oxygen saturation in the 70s.  Significant Events: 11/4 admit to [facility] from [facility]  COVID-19 specific Treatment: Decadron 11/4 > Remdesivir 11/4 >  Subjective: Resting quietly in bed at time of my evaluation.  No respiratory distress or uncontrolled pain.  Assessment & Plan:  Covid pneumonia Saturations stable on modest oxygen support at this time -continue remdesivir and Decadron -if she declines clinically consider convalescent plasma  Recent Labs  Lab 12/17/18 0828 12/17/18 0946 12/18/18 0930  DDIMER  --   --  2.60*  FERRITIN  --  790*  --   CRP 2.9*  --  1.8*  ALT  --  17 19  PROCALCITON  --  0.11  --     Lactic acidosis  Likely hypovolemia in setting of febrile illness -gently hydrate and follow -lactate has normalized  UTI UA grossly abnormal -dose with empiric antibiotic and follow urine culture  Acute kidney injury on CKD stage III Likely prerenal with a possible component of ATN -gently hydrate and follow trend -appears to be slowly improving  Recent Labs  Lab 12/17/18 0946 12/18/18 0930  CREATININE 3.63* 2.51*    Hyperkalemia Initiate Lokelma -due to acute kidney injury -follow trend  HTN Blood pressure well controlled at this time  DM 2 Monitor with sliding scale insulin and adjust as indicated  Morbid obesity - Estimated body mass index is 57.46 kg/m as calculated from the following:   Height as of this encounter: 5' 2"  (1.575 m).   Weight as of this encounter: 142.5 kg.  Lewy body dementia Baseline functional status not clear at this time -continue to follow   DVT prophylaxis: Subcutaneous heparin Code Status: DNR - NO CODE  Family Communication:  Disposition Plan: Progressive care unit  Consultants:  none  Antimicrobials:  Cefepime 11/4 Ceftriaxone 11/4 > Vancomycin 11/4  Objective: Blood pressure 133/60, pulse 75, temperature 98.4 F (36.9 C), temperature source Oral, resp. rate 18, height 5' 2"  (1.575 m), weight (!) 142.5 kg, SpO2 93 %.  Intake/Output Summary (Last 24 hours) at 12/18/2018 1342 Last data filed at 12/18/2018 0900 Gross per 24 hour  Intake 1482.05 ml  Output -  Net 1482.05 ml   Filed Weights   12/17/18 2140  Weight: (!) 142.5 kg    Examination: General: No acute respiratory distress Lungs: Diffuse fine crackles with no wheezing with good air movement throughout Cardiovascular: Regular rate and rhythm without murmur gallop or rub normal S1 and S2 Abdomen: Nontender, nondistended, soft, bowel sounds positive, no rebound, no ascites, no appreciable mass Extremities: No significant cyanosis, clubbing, or edema bilateral lower extremities  CBC: Recent Labs  Lab 12/17/18 0828 12/18/18 0930  WBC 19.5* 7.8  NEUTROABS 16.2* 7.1  HGB 16.5* 13.3  HCT 49.6* 42.2  MCV 88.4 91.7  PLT 243 093*   Basic Metabolic Panel: Recent Labs  Lab 12/17/18 0946 12/18/18 0930  NA 137 137  K 5.7* 4.6  CL 92* 101  CO2 25 23  GLUCOSE 257* 220*  BUN 80* 71*  CREATININE 3.63* 2.51*  CALCIUM 9.5  8.3*  MG 2.4  --    GFR: Estimated Creatinine Clearance: 22.1 mL/min (A) (by C-G formula based on SCr of 2.51 mg/dL (H)).  Liver Function Tests: Recent Labs  Lab 12/17/18 0946 12/18/18 0930  AST 25 13*  ALT 17 19  ALKPHOS 50 43  BILITOT 1.3* 1.1  PROT 8.2* 5.8*  ALBUMIN 3.5 2.6*    HbA1C: Hemoglobin A1C  Date/Time Value Ref Range Status  07/18/2015 03:07 PM 5.9  Final    Comment:    previously 6.1%   Hgb A1c MFr Bld  Date/Time Value Ref Range Status  12/02/2015 12:41 PM 5.7 (H) 4.8 - 5.6 % Final    Comment:              Pre-diabetes: 5.7 - 6.4          Diabetes: >6.4          Glycemic control for adults with diabetes: <7.0   12/20/2014 04:03 PM 6.1 (H) 4.8 - 5.6 % Final    Comment:             Pre-diabetes: 5.7 - 6.4          Diabetes: >6.4          Glycemic control for adults with diabetes: <7.0     Recent Results (from the past 240 hour(s))  Blood Culture (routine x 2)     Status: None (Preliminary result)   Collection Time: 12/17/18  8:28 AM   Specimen: BLOOD  Result Value Ref Range Status   Specimen Description BLOOD RIGHT FA  Final   Special Requests   Final    BOTTLES DRAWN AEROBIC AND ANAEROBIC Blood Culture results may not be optimal due to an inadequate volume of blood received in culture bottles   Culture   Final    NO GROWTH < 24 HOURS Performed at [facility], [address], [place], [place] [address]    Report Status PENDING  Incomplete  Blood Culture (routine x 2)     Status: None (Preliminary result)   Collection Time: 12/17/18  8:29 AM   Specimen: BLOOD  Result Value Ref Range Status   Specimen Description BLOOD LEFT FA  Final   Special Requests   Final    BOTTLES DRAWN AEROBIC AND ANAEROBIC Blood Culture results may not be optimal due to an inadequate volume of blood received in culture bottles   Culture   Final    NO GROWTH < 24 HOURS Performed at [facility], [address] [place], [place] [address]    Report Status PENDING  Incomplete  SARS Coronavirus 2 by RT PCR (hospital order, performed in [place] hospital lab) Nasopharyngeal Nasopharyngeal Swab     Status: Abnormal   Collection Time: 12/17/18  8:29 AM   Specimen: Nasopharyngeal Swab  Result Value Ref Range Status   SARS Coronavirus 2 POSITIVE (A) NEGATIVE Final    Comment: RESULT CALLED TO, READ BACK BY AND VERIFIED WITH: LORRIE LEMONS AT 0600 ON 12/17/2018 JJB (NOTE) If result is NEGATIVE SARS-CoV-2 target nucleic acids are NOT DETECTED. The SARS-CoV-2 RNA is generally  detectable in upper and lower  respiratory specimens during the acute phase of infection. The lowest  concentration of SARS-CoV-2 viral copies this assay can detect is 250  copies / mL. A negative result does not preclude SARS-CoV-2 infection  and should not be used as the sole basis for treatment or other  patient management decisions.  A negative result may  occur with  improper specimen collection / handling, submission of specimen other  than nasopharyngeal swab, presence of viral mutation(s) within the  areas targeted by this assay, and inadequate number of viral copies  (<250 copies / mL). A negative result must be combined with clinical  observations, patient history, and epidemiological information. If result is POSITIVE SARS-CoV-2 target nucleic acids are DETECTED.  The SARS-CoV-2 RNA is generally detectable in upper and lower  respiratory specimens during the acute phase of infection.  Positive  results are indicative of active infection with SARS-CoV-2.  Clinical  correlation with patient history and other diagnostic information is  necessary to determine patient infection status.  Positive results do  not rule out bacterial infection or co-infection with other viruses. If result is PRESUMPTIVE POSTIVE SARS-CoV-2 nucleic acids MAY BE PRESENT.   A presumptive positive result was obtained on the submitted specimen  and confirmed on repeat testing.  While 2019 novel coronavirus  (SARS-CoV-2) nucleic acids may be present in the submitted sample  additional confirmatory testing may be necessary for epidemiological  and / or clinical management purposes  to differentiate between  SARS-CoV-2 and other Sarbecovirus currently known to infect humans.  If clinically indicated additional testing with an alternate test  methodology [phone])  is advised. The SARS-CoV-2 RNA is generally  detectable in upper and lower respiratory specimens during the acute  phase of infection. The  expected result is Negative. Fact Sheet for Patients:  StrictlyIdeas.no Fact Sheet for Healthcare Providers: BankingDealers.co.za This test is not yet approved or cleared by the [place] FDA and has been authorized for detection and/or diagnosis of SARS-CoV-2 by FDA under an Emergency Use Authorization (EUA).  This EUA will remain in effect (meaning this test can be used) for the duration of the COVID-19 declaration under Section 564(b)(1) of the Act, 21 U.S.C. section 360bbb-3(b)(1), unless the authorization is terminated or revoked sooner. Performed at [facility], [address], [place], [place] [address]   Urine culture     Status: Abnormal (Preliminary result)   Collection Time: 12/17/18  9:27 AM   Specimen: Urine, Clean Catch  Result Value Ref Range Status   Specimen Description   Final    URINE, CLEAN CATCH Performed at [facility], [address] [place], [place] [address]    Special Requests   Final    NONE Performed at [facility], [address], [place], [place] [address]    Culture (A)  Final    >=100,000 COLONIES/mL KLEBSIELLA PNEUMONIAE SUSCEPTIBILITIES TO FOLLOW Performed at [facility], [place] [address] [place], [place] [address]    Report Status PENDING  Incomplete  MRSA PCR Screening     Status: None   Collection Time: 12/17/18  1:03 PM   Specimen: Nasopharyngeal  Result Value Ref Range Status   MRSA by PCR NEGATIVE NEGATIVE Final    Comment:        The GeneXpert MRSA Assay (FDA approved for NASAL specimens only), is one component of a comprehensive MRSA colonization surveillance program. It is not intended to diagnose MRSA infection nor to guide or monitor treatment for MRSA infections. Performed at [facility], [address] [place], [place] [address]   MRSA PCR Screening     Status: None   Collection Time: 12/18/18  5:03 AM   Specimen:  Nasopharyngeal  Result Value Ref Range Status   MRSA by PCR NEGATIVE NEGATIVE Final    Comment:  The GeneXpert MRSA Assay (FDA approved for NASAL specimens only), is one component of a comprehensive MRSA colonization surveillance program. It is not intended to diagnose MRSA infection nor to guide or monitor treatment for MRSA infections. Performed at [facility], [place] [address], [place], [place] [address]      Scheduled Meds: . carvedilol  3.125 mg Oral BID  . cholestyramine  4 g Oral Daily  . dexamethasone  6 mg Oral Q24H  . escitalopram  10 mg Oral Daily  . heparin injection (subcutaneous)  7,500 Units Subcutaneous Q8H  . imipramine  25 mg Oral QHS  . insulin aspart  0-15 Units Subcutaneous TID WC  . insulin aspart  0-5 Units Subcutaneous QHS  . linagliptin  5 mg Oral Daily  . oxybutynin  10 mg Oral Daily  . simvastatin  20 mg Oral QHS  . sodium zirconium cyclosilicate  10 g Oral Daily  . vitamin C  500 mg Oral Daily  . zinc sulfate  220 mg Oral Daily   Continuous Infusions: . sodium chloride 75 mL/hr at 12/18/18 0825  . cefTRIAXone (ROCEPHIN)  IV Stopped (12/17/18 2325)  . [START ON 12/19/2018] remdesivir 100 mg in NS 250 mL       LOS: 1 day   Cherene Altes, MD [facility] Office  [phone] Pager - Text Page per Amion  If 7PM-7AM, please contact night-coverage per Amion 12/18/2018, 1:42 PM

## 2018-12-18 NOTE — Progress Notes (Signed)
Pharmacy Note - Remdesivir Dosing  O:  ALT:  17  CXR:  Hypoinflation w/mild bilat patchy mixed interstitial airspace density; may be d/t infection  Requiring supplemental O2:  On 6L per [place]   A/P:  Patient meets criteria for remdesivir.  Begin remdesivir 200 mg IV x 1, followed by 100 mg IV daily x 4 days  Monitor ALT, clinical progress   Despina Pole, Pharm. D. Clinical Pharmacist 12/18/2018 4:01 AM ,

## 2018-12-18 NOTE — Telephone Encounter (Signed)
Yes---I sent her over there

## 2018-12-18 NOTE — Progress Notes (Signed)
2 phlebotomists unsuccessful at drawing labs this AM. Charge nurse notified and IV team consult placed.

## 2018-12-18 NOTE — Telephone Encounter (Signed)
FYI- patient is currently admitted at [facility] with Covid-19

## 2018-12-19 DIAGNOSIS — N179 Acute kidney failure, unspecified: Secondary | ICD-10-CM

## 2018-12-19 DIAGNOSIS — N1831 Chronic kidney disease, stage 3a: Secondary | ICD-10-CM

## 2018-12-19 DIAGNOSIS — N3000 Acute cystitis without hematuria: Secondary | ICD-10-CM

## 2018-12-19 LAB — CBC WITH DIFFERENTIAL/PLATELET
Abs Immature Granulocytes: 0.14 10*3/uL — ABNORMAL HIGH (ref 0.00–0.07)
Basophils Absolute: 0 10*3/uL (ref 0.0–0.1)
Basophils Relative: 0 %
Eosinophils Absolute: 0 10*3/uL (ref 0.0–0.5)
Eosinophils Relative: 0 %
HCT: 43.3 % (ref 36.0–46.0)
Hemoglobin: 13.6 g/dL (ref 12.0–15.0)
Immature Granulocytes: 2 %
Lymphocytes Relative: 6 %
Lymphs Abs: 0.5 10*3/uL — ABNORMAL LOW (ref 0.7–4.0)
MCH: 28.9 pg (ref 26.0–34.0)
MCHC: 31.4 g/dL (ref 30.0–36.0)
MCV: 92.1 fL (ref 80.0–100.0)
Monocytes Absolute: 0.3 10*3/uL (ref 0.1–1.0)
Monocytes Relative: 4 %
Neutro Abs: 7 10*3/uL (ref 1.7–7.7)
Neutrophils Relative %: 88 %
Platelets: 142 10*3/uL — ABNORMAL LOW (ref 150–400)
RBC: 4.7 MIL/uL (ref 3.87–5.11)
RDW: 15 % (ref 11.5–15.5)
WBC: 7.9 10*3/uL (ref 4.0–10.5)
nRBC: 0 % (ref 0.0–0.2)

## 2018-12-19 LAB — HEMOGLOBIN A1C
Hgb A1c MFr Bld: 7.5 % — ABNORMAL HIGH (ref 4.8–5.6)
Mean Plasma Glucose: 168.55 mg/dL

## 2018-12-19 LAB — URINE CULTURE: Culture: >=100000 — AB

## 2018-12-19 LAB — GLUCOSE, CAPILLARY
Glucose-Capillary: 181 mg/dL — ABNORMAL HIGH (ref 70–99)
Glucose-Capillary: 213 mg/dL — ABNORMAL HIGH (ref 70–99)
Glucose-Capillary: 239 mg/dL — ABNORMAL HIGH (ref 70–99)
Glucose-Capillary: 179 mg/dL — ABNORMAL HIGH (ref 70–99)

## 2018-12-19 LAB — COMPREHENSIVE METABOLIC PANEL
ALT: 19 U/L (ref 0–44)
AST: 16 U/L (ref 15–41)
Albumin: 2.7 g/dL — ABNORMAL LOW (ref 3.5–5.0)
Alkaline Phosphatase: 42 U/L (ref 38–126)
Anion gap: 13 (ref 5–15)
BUN: 58 mg/dL — ABNORMAL HIGH (ref 8–23)
CO2: 24 mmol/L (ref 22–32)
Calcium: 8.5 mg/dL — ABNORMAL LOW (ref 8.9–10.3)
Chloride: 101 mmol/L (ref 98–111)
Creatinine, Ser: 1.87 mg/dL — ABNORMAL HIGH (ref 0.44–1.00)
GFR calc Af Amer: 28 mL/min — ABNORMAL LOW (ref >60)
GFR calc non Af Amer: 24 mL/min — ABNORMAL LOW (ref >60)
Glucose, Bld: 192 mg/dL — ABNORMAL HIGH (ref 70–99)
Potassium: 4.3 mmol/L (ref 3.5–5.1)
Sodium: 138 mmol/L (ref 135–145)
Total Bilirubin: 0.9 mg/dL (ref 0.3–1.2)
Total Protein: 5.9 g/dL — ABNORMAL LOW (ref 6.5–8.1)

## 2018-12-19 LAB — PROCALCITONIN: Procalcitonin: 0.14 ng/mL

## 2018-12-19 LAB — C-REACTIVE PROTEIN: CRP: 1.1 mg/dL — ABNORMAL HIGH (ref <1.0)

## 2018-12-19 LAB — ABO/RH: ABO/RH(D): A POS

## 2018-12-19 LAB — D-DIMER, QUANTITATIVE: D-Dimer, Quant: 2.88 ug/mL-FEU — ABNORMAL HIGH (ref 0.00–0.50)

## 2018-12-19 NOTE — Plan of Care (Addendum)
Pt had a better day today. Yesterday pt was very somnolent. Today pt is engaging in conversation and moving herself more. Pt tried to get out of the bed unassisted. Pt guided back into bed with no other issues. Husband brought dentures and patients glasses. Items placed at bedside. Pt currently resting in bed, eyes open, breathing normal, with no s/s of distress noted. Will continue to monitor.    Problem: Education: Goal: Knowledge of risk factors and measures for prevention of condition will improve Outcome: Progressing   Problem: Coping: Goal: Psychosocial and spiritual needs will be supported Outcome: Progressing   Problem: Respiratory: Goal: Will maintain a patent airway Outcome: Progressing Goal: Complications related to the disease process, condition or treatment will be avoided or minimized Outcome: Progressing

## 2018-12-19 NOTE — Progress Notes (Addendum)
PROGRESS NOTE                                                                                                                                                                                                             Patient Demographics:    Deborah [name], is a 85 y.o. female, DOB - 06-15-1930, WCB:[id number]  Outpatient Primary MD for the patient is Venia Carbon, MD   Admit date - 12/17/2018   LOS - 2  No chief complaint on file.      Brief Narrative: Patient is a 85 y.o. female with PMHx of obesity, CKD stage IIIb, mild dementia, chronic diastolic heart failure sent from SNF to [facility] ED for shortness of breath-found to have acute hypoxic respiratory failure with O2 saturation in the 70s secondary to COVID-19 pneumonia.   Subjective:    Deborah [name] was lying comfortably in bed-denies any chest pain or shortness of breath.  She was titrated down to 3.5 L of oxygen.     Assessment  & Plan :   Acute Hypoxic Resp Failure due to Covid 19 Viral pneumonia: Seems to be slowly improving-down to 3.5-4 L of oxygen.  Continue steroids and remdesivir.  Encourage incentive spirometry and other supportive care.  Fever: afebrile  O2 requirements: On 3.5 L (was on 6 L yesterday)  COVID-19 Labs: Recent Labs    12/17/18 0828 12/17/18 0946 12/18/18 0930  DDIMER  --   --  2.60*  FERRITIN  --  790*  --   LDH  --  250*  --   CRP 2.9*  --  1.8*    Lab Results  Component Value Date   SARSCOV2NAA POSITIVE (A) 12/17/2018     COVID-19 Medications: Steroids: 11/3>> Remdesivir: 11/3>> Actemra: Not given due to slow clinical improvement, concurrent UTI. Convalescent Plasma: Not given  research Studies:N/A  Other medications: Diuretics:Euvolemic Antibiotics:11/4>> Rocephin for UTI  Prone/Incentive Spirometry:  encourage incentive spirometry use 3-4/hour.  DVT Prophylaxis  :  Heparin   UTI: Preliminary urine culture  positive for Klebsiella-continue Rocephin-follow final culture results  AKI on CKD stage IIIb: AKI likely hemodynamically mediated-slowly improving-saline lock IVF  Hyperkalemia: Resolved  Chronic diastolic heart failure: Euvolemic on exam-since renal function improving-stop IV fluids.    HTN: Controlled-continue Coreg  DM-2 (A1c 5.7): CBGs relatively stable-continue SSI.  CBG (last 3)  Recent Labs    12/18/18 2134  GLUCAP 195*    Lewy body dementia: Supportive care  Debility/deconditioning: Secondary to acute illness-PT/OT ordered.   Obesity: Estimated body mass index is 57.46 kg/m as calculated from the following:   Height as of this encounter: 5' 2"  (1.575 m).   Weight as of this encounter: 142.5 kg.   ABG: No results found for: PHART, PCO2ART, PO2ART, HCO3, TCO2, ACIDBASEDEF, O2SAT  Vent Settings: N/A  Condition - Stable  Family Communication  :  Spouse updated over the phone on 11/6  Code Status :  DNR  Diet :  Diet Order            Diet Carb Modified Fluid consistency: Thin; Room service appropriate? Yes  Diet effective now               Disposition Plan  :  Remain hospitalized-probably will need home health services or SNF on discharge.  Barriers to discharge: Hypoxia requiring O2 supplementation/complete 5 days of IV Remdesivir  Consults  :  None  Procedures  :  None  Antibiotics  :    Anti-infectives (From admission, onward)   Start     Dose/Rate Route Frequency Ordered Stop   12/19/18 1000  remdesivir 100 mg in sodium chloride 0.9 % 250 mL IVPB     100 mg 500 mL/hr over 30 Minutes Intravenous Every 24 hours 12/18/18 0355 12/23/18 0959   12/18/18 0430  remdesivir 200 mg in sodium chloride 0.9 % 250 mL IVPB     200 mg 500 mL/hr over 30 Minutes Intravenous Once 12/18/18 0355 12/18/18 0527   12/17/18 2300  cefTRIAXone (ROCEPHIN) 1 g in sodium chloride 0.9 % 100 mL IVPB     1 g 200 mL/hr over 30 Minutes Intravenous Every 24 hours 12/17/18  2222        Inpatient Medications  Scheduled Meds: . carvedilol  3.125 mg Oral BID  . cholestyramine  4 g Oral Daily  . dexamethasone  6 mg Oral Q24H  . escitalopram  10 mg Oral Daily  . heparin injection (subcutaneous)  7,500 Units Subcutaneous Q8H  . insulin aspart  0-15 Units Subcutaneous TID WC  . insulin aspart  0-5 Units Subcutaneous QHS  . linagliptin  5 mg Oral Daily  . oxybutynin  10 mg Oral Daily  . simvastatin  20 mg Oral QHS  . sodium zirconium cyclosilicate  10 g Oral Daily  . vitamin C  500 mg Oral Daily  . zinc sulfate  220 mg Oral Daily   Continuous Infusions: . sodium chloride 75 mL/hr at 12/18/18 0825  . cefTRIAXone (ROCEPHIN)  IV 1 g (12/18/18 2220)  . remdesivir 100 mg in NS 250 mL     PRN Meds:.acetaminophen, chlorpheniramine-HYDROcodone, guaiFENesin-dextromethorphan, LORazepam, ondansetron **OR** ondansetron (ZOFRAN) IV   Time Spent in minutes  25  See all Orders from today for further details   Oren Binet M.D on 12/19/2018 at 7:31 AM  To page go to [email or web address] - use universal password  [facility] -  Office  [phone]    Objective:   Vitals:   12/18/18 1600 12/18/18 1629 12/18/18 1914 12/19/18 0410  BP:  133/60 (!) 163/82 (!) 122/58  Pulse:   74 73  Resp: 18 20 17 18   Temp:  98.2 F (36.8 C) 97.9 F (36.6 C) 98.5 F (36.9 C)  TempSrc:  Oral Oral Oral  SpO2:  94% 96% 96%  Weight:      Height:  Wt Readings from Last 3 Encounters:  12/17/18 (!) 142.5 kg  12/17/18 (!) 139 kg  11/30/18 (!) 146.1 kg     Intake/Output Summary (Last 24 hours) at 12/19/2018 0731 Last data filed at 12/19/2018 0500 Gross per 24 hour  Intake 1388 ml  Output 1350 ml  Net 38 ml     Physical Exam Gen Exam: Mildly confused but not in any distress.   HEENT:atraumatic, normocephalic Chest: B/L clear to auscultation anteriorly CVS:S1S2 regular Abdomen:soft non tender, non distended Extremities:no edema Neurology: Non focal Skin:  no rash   Data Review:    CBC Recent Labs  Lab 12/17/18 0828 12/18/18 0930 12/19/18 0605  WBC 19.5* 7.8 7.9  HGB 16.5* 13.3 13.6  HCT 49.6* 42.2 43.3  PLT 243 121* 142*  MCV 88.4 91.7 92.1  MCH 29.4 28.9 28.9  MCHC 33.3 31.5 31.4  RDW 15.3 15.0 15.0  LYMPHSABS 1.6 0.4* 0.5*  MONOABS 0.9 0.2 0.3  EOSABS 0.0 0.0 0.0  BASOSABS 0.1 0.0 0.0    Chemistries  Recent Labs  Lab 12/17/18 0946 12/18/18 0930 12/19/18 0605  NA 137 137 138  K 5.7* 4.6 4.3  CL 92* 101 101  CO2 25 23 24   GLUCOSE 257* 220* 192*  BUN 80* 71* 58*  CREATININE 3.63* 2.51* 1.87*  CALCIUM 9.5 8.3* 8.5*  MG 2.4  --   --   AST 25 13* 16  ALT 17 19 19   ALKPHOS 50 43 42  BILITOT 1.3* 1.1 0.9   ------------------------------------------------------------------------------------------------------------------ Recent Labs    12/17/18 0946  TRIG 428*    Lab Results  Component Value Date   HGBA1C 5.7 (H) 12/02/2015   ------------------------------------------------------------------------------------------------------------------ Recent Labs    12/17/18 0946  TSH 1.625   ------------------------------------------------------------------------------------------------------------------ Recent Labs    12/17/18 0946  FERRITIN 790*    Coagulation profile No results for input(s): INR, PROTIME in the last 168 hours.  Recent Labs    12/18/18 0930  DDIMER 2.60*    Cardiac Enzymes No results for input(s): CKMB, TROPONINI, MYOGLOBIN in the last 168 hours.  Invalid input(s): CK ------------------------------------------------------------------------------------------------------------------    Component Value Date/Time   BNP 268.0 (H) 12/17/2018 0946    Micro Results Recent Results (from the past 240 hour(s))  Blood Culture (routine x 2)     Status: None (Preliminary result)   Collection Time: 12/17/18  8:28 AM   Specimen: BLOOD  Result Value Ref Range Status   Specimen Description  BLOOD RIGHT FA  Final   Special Requests   Final    BOTTLES DRAWN AEROBIC AND ANAEROBIC Blood Culture results may not be optimal due to an inadequate volume of blood received in culture bottles   Culture   Final    NO GROWTH < 24 HOURS Performed at [facility], [address], [place], [place] [address]    Report Status PENDING  Incomplete  Blood Culture (routine x 2)     Status: None (Preliminary result)   Collection Time: 12/17/18  8:29 AM   Specimen: BLOOD  Result Value Ref Range Status   Specimen Description BLOOD LEFT FA  Final   Special Requests   Final    BOTTLES DRAWN AEROBIC AND ANAEROBIC Blood Culture results may not be optimal due to an inadequate volume of blood received in culture bottles   Culture   Final    NO GROWTH < 24 HOURS Performed at [facility], [address], [place],  [address]    Report Status  PENDING  Incomplete  SARS Coronavirus 2 by RT PCR (hospital order, performed in [facility] hospital lab) Nasopharyngeal Nasopharyngeal Swab     Status: Abnormal   Collection Time: 12/17/18  8:29 AM   Specimen: Nasopharyngeal Swab  Result Value Ref Range Status   SARS Coronavirus 2 POSITIVE (A) NEGATIVE Final    Comment: RESULT CALLED TO, READ BACK BY AND VERIFIED WITH: LORRIE LEMONS AT 1937 ON 12/17/2018 JJB (NOTE) If result is NEGATIVE SARS-CoV-2 target nucleic acids are NOT DETECTED. The SARS-CoV-2 RNA is generally detectable in upper and lower  respiratory specimens during the acute phase of infection. The lowest  concentration of SARS-CoV-2 viral copies this assay can detect is 250  copies / mL. A negative result does not preclude SARS-CoV-2 infection  and should not be used as the sole basis for treatment or other  patient management decisions.  A negative result may occur with  improper specimen collection / handling, submission of specimen other  than nasopharyngeal swab, presence of viral mutation(s) within the  areas targeted  by this assay, and inadequate number of viral copies  (<250 copies / mL). A negative result must be combined with clinical  observations, patient history, and epidemiological information. If result is POSITIVE SARS-CoV-2 target nucleic acids are DETECTED.  The SARS-CoV-2 RNA is generally detectable in upper and lower  respiratory specimens during the acute phase of infection.  Positive  results are indicative of active infection with SARS-CoV-2.  Clinical  correlation with patient history and other diagnostic information is  necessary to determine patient infection status.  Positive results do  not rule out bacterial infection or co-infection with other viruses. If result is PRESUMPTIVE POSTIVE SARS-CoV-2 nucleic acids MAY BE PRESENT.   A presumptive positive result was obtained on the submitted specimen  and confirmed on repeat testing.  While 2019 novel coronavirus  (SARS-CoV-2) nucleic acids may be present in the submitted sample  additional confirmatory testing may be necessary for epidemiological  and / or clinical management purposes  to differentiate between  SARS-CoV-2 and other Sarbecovirus currently known to infect humans.  If clinically indicated additional testing with an alternate test  methodology [phone])  is advised. The SARS-CoV-2 RNA is generally  detectable in upper and lower respiratory specimens during the acute  phase of infection. The expected result is Negative. Fact Sheet for Patients:  StrictlyIdeas.no Fact Sheet for Healthcare Providers: BankingDealers.co.za This test is not yet approved or cleared by the [place] FDA and has been authorized for detection and/or diagnosis of SARS-CoV-2 by FDA under an Emergency Use Authorization (EUA).  This EUA will remain in effect (meaning this test can be used) for the duration of the COVID-19 declaration under Section 564(b)(1) of the Act, 21 U.S.C. section  360bbb-3(b)(1), unless the authorization is terminated or revoked sooner. Performed at [facility], [address], [place], [place] [address]   Urine culture     Status: Abnormal (Preliminary result)   Collection Time: 12/17/18  9:27 AM   Specimen: Urine, Clean Catch  Result Value Ref Range Status   Specimen Description   Final    URINE, CLEAN CATCH Performed at [facility], [address] [place], [place] [address]    Special Requests   Final    NONE Performed at [facility], [address], [place], [place] [address]    Culture (A)  Final    >=100,000 COLONIES/mL KLEBSIELLA PNEUMONIAE SUSCEPTIBILITIES TO FOLLOW Performed at [facility], [address] [place], [place] [address]    Report Status PENDING  Incomplete  MRSA PCR Screening     Status: None   Collection Time: 12/17/18  1:03 PM   Specimen: Nasopharyngeal  Result Value Ref Range Status   MRSA by PCR NEGATIVE NEGATIVE Final    Comment:        The GeneXpert MRSA Assay (FDA approved for NASAL specimens only), is one component of a comprehensive MRSA colonization surveillance program. It is not intended to diagnose MRSA infection nor to guide or monitor treatment for MRSA infections. Performed at [facility], [address] [place], [place] [address]   MRSA PCR Screening     Status: None   Collection Time: 12/18/18  5:03 AM   Specimen: Nasopharyngeal  Result Value Ref Range Status   MRSA by PCR NEGATIVE NEGATIVE Final    Comment:        The GeneXpert MRSA Assay (FDA approved for NASAL specimens only), is one component of a comprehensive MRSA colonization surveillance program. It is not intended to diagnose MRSA infection nor to guide or monitor treatment for MRSA infections. Performed at [facility], [place] [address], [place], [place] [address]     Radiology Reports Dg Chest 1 View  Result Date: 11/30/2018 CLINICAL DATA:   Diagnosed with COVID-19 2 days ago. Fever and increased weakness. EXAM: CHEST  1 VIEW COMPARISON:  February 20, 2018 FINDINGS: Mild cardiomegaly. The hila and mediastinum are normal. No nodules or masses. Vascular crowding in the medial right lung base. And no focal infiltrate noted. IMPRESSION: No active disease. Electronically Signed   By: Dorise Bullion III M.D   On: 11/30/2018 18:43   Dg Chest Port 1 View  Result Date: 12/17/2018 CLINICAL DATA:  Shortness of breath, hypoxia. EXAM: PORTABLE CHEST 1 VIEW COMPARISON:  11/30/2018 FINDINGS: Patient slightly rotated to the left. Lungs are hypoinflated and demonstrate bilateral patchy mixed interstitial airspace density. No definite effusion. Mild stable cardiomegaly. IMPRESSION: Hypoinflation with mild bilateral patchy mixed interstitial airspace density which may be due to infection. Electronically Signed   By: Marin Olp M.D.   On: 12/17/2018 08:54

## 2018-12-20 LAB — COMPREHENSIVE METABOLIC PANEL
ALT: 30 U/L (ref 0–44)
AST: 25 U/L (ref 15–41)
Albumin: 2.6 g/dL — ABNORMAL LOW (ref 3.5–5.0)
Alkaline Phosphatase: 44 U/L (ref 38–126)
Anion gap: 14 (ref 5–15)
BUN: 60 mg/dL — ABNORMAL HIGH (ref 8–23)
CO2: 23 mmol/L (ref 22–32)
Calcium: 8.8 mg/dL — ABNORMAL LOW (ref 8.9–10.3)
Chloride: 103 mmol/L (ref 98–111)
Creatinine, Ser: 1.82 mg/dL — ABNORMAL HIGH (ref 0.44–1.00)
GFR calc Af Amer: 29 mL/min — ABNORMAL LOW (ref >60)
GFR calc non Af Amer: 25 mL/min — ABNORMAL LOW (ref >60)
Glucose, Bld: 207 mg/dL — ABNORMAL HIGH (ref 70–99)
Potassium: 4.7 mmol/L (ref 3.5–5.1)
Sodium: 140 mmol/L (ref 135–145)
Total Bilirubin: 0.8 mg/dL (ref 0.3–1.2)
Total Protein: 5.7 g/dL — ABNORMAL LOW (ref 6.5–8.1)

## 2018-12-20 LAB — CBC WITH DIFFERENTIAL/PLATELET
Abs Immature Granulocytes: 0.13 10*3/uL — ABNORMAL HIGH (ref 0.00–0.07)
Basophils Absolute: 0 10*3/uL (ref 0.0–0.1)
Basophils Relative: 0 %
Eosinophils Absolute: 0 10*3/uL (ref 0.0–0.5)
Eosinophils Relative: 0 %
HCT: 44.4 % (ref 36.0–46.0)
Hemoglobin: 13.8 g/dL (ref 12.0–15.0)
Immature Granulocytes: 2 %
Lymphocytes Relative: 5 %
Lymphs Abs: 0.4 10*3/uL — ABNORMAL LOW (ref 0.7–4.0)
MCH: 28.8 pg (ref 26.0–34.0)
MCHC: 31.1 g/dL (ref 30.0–36.0)
MCV: 92.5 fL (ref 80.0–100.0)
Monocytes Absolute: 0.3 10*3/uL (ref 0.1–1.0)
Monocytes Relative: 4 %
Neutro Abs: 7.4 10*3/uL (ref 1.7–7.7)
Neutrophils Relative %: 89 %
Platelets: 120 10*3/uL — ABNORMAL LOW (ref 150–400)
RBC: 4.8 MIL/uL (ref 3.87–5.11)
RDW: 15 % (ref 11.5–15.5)
WBC: 8.2 10*3/uL (ref 4.0–10.5)
nRBC: 0 % (ref 0.0–0.2)

## 2018-12-20 LAB — GLUCOSE, CAPILLARY
Glucose-Capillary: 193 mg/dL — ABNORMAL HIGH (ref 70–99)
Glucose-Capillary: 263 mg/dL — ABNORMAL HIGH (ref 70–99)
Glucose-Capillary: 198 mg/dL — ABNORMAL HIGH (ref 70–99)
Glucose-Capillary: 186 mg/dL — ABNORMAL HIGH (ref 70–99)

## 2018-12-20 LAB — FERRITIN: Ferritin: 495 ng/mL — ABNORMAL HIGH (ref 11–307)

## 2018-12-20 LAB — C-REACTIVE PROTEIN: CRP: 0.9 mg/dL (ref <1.0)

## 2018-12-20 LAB — D-DIMER, QUANTITATIVE: D-Dimer, Quant: 2.49 ug/mL-FEU — ABNORMAL HIGH (ref 0.00–0.50)

## 2018-12-20 NOTE — Evaluation (Addendum)
Occupational Therapy Evaluation Patient Details Name: Deborah [name] MRN: [id number] DOB: 1930-06-20 Today's Date: 12/20/2018    History of Present Illness 85 y/o female w/ hx of urinary incontinence, heredetary idiopathic peripheral neuropathy, sleep apnea, palpitations, hypothyroidism, OA, obesity, LE edema, HTN, DM, CKD III, anxiety, R shld surgery, B TKA, cardiac cath and back surgery. Presented to ED w/ SOB and fatigue, was initially seen in ED and dx with COVID 10/18 but was d/c home to SNF now returned w/ increasing weakness and SOB. admitted w/ acute hypoxic resp failure sec to COVID PNA.   Clinical Impression   Pt admitted with above diagnoses, presenting with decreased activity tolerance and cardiopulmonary status as well as baseline neurological deficits limiting ability to engage in BADL. PTA pt at SNF, stated she uses wheelchair or walker and has assist for dressing at bathing. At time of eval pt is mod A +2 for safety with RW and transfers. She presents with cognitive deficits (suspect baseline), but is able to orient herself to the year. Given current status, pt remains appropriate for SNF level care and to return to facility if permitted. Will continue to follow per POC listed below.     Follow Up Recommendations  SNF;Supervision/Assistance - 24 hour    Equipment Recommendations  None recommended by OT    Recommendations for Other Services       Precautions / Restrictions Precautions Precautions: Fall Precaution Comments: monitor O2, takes canula out Restrictions Weight Bearing Restrictions: No      Mobility Bed Mobility Overal bed mobility: Needs Assistance Bed Mobility: Supine to Sit     Supine to sit: Mod assist     General bed mobility comments: up in chair  Transfers Overall transfer level: Needs assistance Equipment used: Rolling walker (2 wheeled) Transfers: Stand Pivot Transfers Sit to Stand: Mod assist;+2 physical assistance;+2  safety/equipment Stand pivot transfers: Mod assist;+2 safety/equipment       General transfer comment: mod A +2 for safety and steadying    Balance Overall balance assessment: Needs assistance Sitting-balance support: Feet unsupported;Bilateral upper extremity supported Sitting balance-Leahy Scale: Fair     Standing balance support: During functional activity;Bilateral upper extremity supported Standing balance-Leahy Scale: Poor Standing balance comment: reliant on external support                           ADL either performed or assessed with clinical judgement   ADL Overall ADL's : Needs assistance/impaired Eating/Feeding: Set up;Sitting;Cueing for sequencing   Grooming: Set up;Cueing for sequencing;Sitting   Upper Body Bathing: Moderate assistance;Sitting   Lower Body Bathing: Maximal assistance;Sit to/from stand;Sitting/lateral leans   Upper Body Dressing : Moderate assistance;Sitting   Lower Body Dressing: Maximal assistance;Sitting/lateral leans;Sit to/from stand   Toilet Transfer: Moderate assistance;Stand-pivot;BSC Toilet Transfer Details (indicate cue type and reason): simulated with recliner Toileting- Clothing Manipulation and Hygiene: Total assistance       Functional mobility during ADLs: Moderate assistance;Cueing for safety;Cueing for sequencing(stand pivot only) General ADL Comments: pt limited by suspected baseline cognitive deficits, as well as decreased activity tolerance and generalized weakness     Vision Patient Visual Report: No change from baseline       Perception     Praxis      Pertinent Vitals/Pain Pain Assessment: No/denies pain     Hand Dominance     Extremity/Trunk Assessment Upper Extremity Assessment Upper Extremity Assessment: Generalized weakness   Lower Extremity Assessment Lower Extremity Assessment: Defer to  PT evaluation       Communication Communication Communication: (confused needs to be  instructed seveal times, multi modal)   Cognition Arousal/Alertness: Awake/alert Behavior During Therapy: Anxious;Restless Overall Cognitive Status: No family/caregiver present to determine baseline cognitive functioning Area of Impairment: Orientation;Attention;Memory;Following commands;Safety/judgement;Problem solving                 Orientation Level: Disoriented to;Place;Situation(able to state it is 2020) Current Attention Level: Sustained Memory: Decreased short-term memory Following Commands: Follows one step commands inconsistently;Follows one step commands with increased time Safety/Judgement: Decreased awareness of safety;Decreased awareness of deficits   Problem Solving: Slow processing;Decreased initiation;Difficulty sequencing;Requires verbal cues;Requires tactile cues General Comments: suspect cognitive impairement at baseline; attempting to use hand as drink without drink present   General Comments  Pt was on 3L/min of 02 via [place] and noted to be taking 02 off multi times during session, educated pt on need to keep 02 in place and replaced 02 several times. Pt able to maintain sats in 80-90s, when taken off is when noted to be desat into low 80s, with return to nasal passage increases to high 80s-90s.    Exercises     Shoulder Instructions      Home Living Family/patient expects to be discharged to:: Skilled nursing facility                                        Prior Functioning/Environment Level of Independence: Needs assistance  Gait / Transfers Assistance Needed: transferred to and from w/c ADL's / Homemaking Assistance Needed: assist with dressing and bathing            OT Problem List: Decreased strength;Decreased knowledge of use of DME or AE;Obesity;Decreased activity tolerance;Cardiopulmonary status limiting activity;Impaired balance (sitting and/or standing);Decreased safety awareness      OT Treatment/Interventions:  Self-care/ADL training;Therapeutic exercise;Patient/family education;Balance training;Energy conservation;Therapeutic activities;DME and/or AE instruction;Cognitive remediation/compensation    OT Goals(Current goals can be found in the care plan section) Acute Rehab OT Goals Patient Stated Goal: feel better OT Goal Formulation: With patient Time For Goal Achievement: 01/03/19 Potential to Achieve Goals: Good ADL Goals Pt Will Transfer to Toilet: with min assist;stand pivot transfer;bedside commode Pt/caregiver will Perform Home Exercise Program: Increased strength;Both right and left upper extremity;With theraband;With written HEP provided Additional ADL Goal #1: Pt will maintain O2 sats above 90% while engaging in BADL task  OT Frequency: Min 2X/week   Barriers to D/C:            Co-evaluation              AM-PAC OT "6 Clicks" Daily Activity     Outcome Measure Help from another person eating meals?: A Little Help from another person taking care of personal grooming?: A Little Help from another person toileting, which includes using toliet, bedpan, or urinal?: A Lot Help from another person bathing (including washing, rinsing, drying)?: A Lot Help from another person to put on and taking off regular upper body clothing?: A Lot Help from another person to put on and taking off regular lower body clothing?: A Lot 6 Click Score: 14   End of Session Equipment Utilized During Treatment: Rolling walker;Oxygen  Activity Tolerance: Patient tolerated treatment well Patient left: in chair;with call bell/phone within reach;with chair alarm set  OT Visit Diagnosis: Unsteadiness on feet (R26.81);Other abnormalities of gait and mobility (R26.89);Muscle weakness (generalized) (M62.81);Other symptoms and  signs involving cognitive function                Time: 1002-1015 OT Time Calculation (min): 13 min Charges:  OT General Charges $OT Visit: 1 Visit OT Evaluation $OT Eval Moderate  Complexity: [place], MSOT, OTR/L [place] OT/ Acute Relief OT [place] Office: [phone] [place]: [phone]  Zenovia Jarred 12/20/2018, 12:51 PM

## 2018-12-20 NOTE — Progress Notes (Signed)
PROGRESS NOTE                                                                                                                                                                                                             Patient Demographics:    Deborah [name], is a 85 y.o. female, DOB - 12-Jun-1930, GGE:[id number]  Outpatient Primary MD for the patient is Venia Carbon, MD   Admit date - 12/17/2018   LOS - 3  No chief complaint on file.      Brief Narrative: Patient is a 85 y.o. female with PMHx of obesity, CKD stage IIIb, mild dementia, chronic diastolic heart failure sent from SNF to [facility] ED for shortness of breath-found to have acute hypoxic respiratory failure with O2 saturation in the 70s secondary to COVID-19 pneumonia.   Subjective:   Patient in bed, appears comfortable, denies any headache, no fever, no chest pain or pressure, no shortness of breath , no abdominal pain. No focal weakness.   Assessment  & Plan :   Acute Hypoxic Resp Failure due to Covid 19 Viral pneumonia: she was started on treatment with IV steroids and remdesivir, initially required about 6 L nasal cannula oxygen now down to 1 to 2 L.  Clinically improving, encouraged to sit up in chair and daytime use flutter valve and I-S for pulmonary toiletry.  Finished remdesivir course and discharge.Marland Kitchen     COVID-19 Labs: Recent Labs    12/17/18 0946 12/18/18 0930 12/19/18 0605 12/20/18 0612  DDIMER  --  2.60* 2.88* 2.49*  FERRITIN 790*  --   --  495*  LDH 250*  --   --   --   CRP  --  1.8* 1.1* 0.9    Lab Results  Component Value Date   SARSCOV2NAA POSITIVE (A) 12/17/2018     COVID-19 Medications: Steroids: 11/3>> Remdesivir: 11/3>> Actemra: Not given due to slow clinical improvement, concurrent UTI. Convalescent Plasma: Not given  research Studies:N/A    UTI: Klebsiella UTI, cultures noted.  Complete Rocephin treatment on 12/20/2018 then  stop.  Clinically resolved.  AKI on CKD stage IIIb: AKI likely hemodynamically mediated-after hydration creatinine back to baseline of 1.8.  Hyperkalemia: Resolved  Chronic diastolic heart failure: Euvolemic on exam-since renal function improving-stop IV fluids.    HTN: Controlled-continue Coreg  DM-2 (A1c 5.7): CBGs  relatively stable-continue SSI.  CBG (last 3)  Recent Labs    12/19/18 1526 12/19/18 2129 12/20/18 0714  GLUCAP 239* 179* 193*    Lewy body dementia: Supportive care  Debility/deconditioning: Secondary to acute illness-PT/OT ordered.  Obesity: BMI 57, follow with PCP for weight loss.     Condition - Stable  Family Communication  :  Spouse updated over the phone on 11/6.  11/7 - 9.26 am - full mailbox  Code Status :  DNR  Diet :  Diet Order            Diet Carb Modified Fluid consistency: Thin; Room service appropriate? Yes  Diet effective now               Disposition Plan  :  SNF  Barriers to discharge: Complete 5 days of IV Remdesivir  Consults  :  None  Procedures  :  None  Antibiotics  :    Anti-infectives (From admission, onward)   Start     Dose/Rate Route Frequency Ordered Stop   12/19/18 1000  remdesivir 100 mg in sodium chloride 0.9 % 250 mL IVPB     100 mg 500 mL/hr over 30 Minutes Intravenous Every 24 hours 12/18/18 0355 12/23/18 0959   12/18/18 0430  remdesivir 200 mg in sodium chloride 0.9 % 250 mL IVPB     200 mg 500 mL/hr over 30 Minutes Intravenous Once 12/18/18 0355 12/18/18 0527   12/17/18 2300  cefTRIAXone (ROCEPHIN) 1 g in sodium chloride 0.9 % 100 mL IVPB     1 g 200 mL/hr over 30 Minutes Intravenous Every 24 hours 12/17/18 2222 12/21/18 2259     DVT Prophylaxis  :  Heparin   Inpatient Medications  Scheduled Meds: . carvedilol  3.125 mg Oral BID  . cholestyramine  4 g Oral Daily  . dexamethasone  6 mg Oral Q24H  . escitalopram  10 mg Oral Daily  . heparin injection (subcutaneous)  7,500 Units Subcutaneous  Q8H  . insulin aspart  0-15 Units Subcutaneous TID WC  . insulin aspart  0-5 Units Subcutaneous QHS  . linagliptin  5 mg Oral Daily  . oxybutynin  10 mg Oral Daily  . simvastatin  20 mg Oral QHS  . vitamin C  500 mg Oral Daily  . zinc sulfate  220 mg Oral Daily   Continuous Infusions: . sodium chloride 10 mL/hr at 12/19/18 0900  . cefTRIAXone (ROCEPHIN)  IV 1 g (12/19/18 2240)  . remdesivir 100 mg in NS 250 mL 100 mg (12/19/18 1031)   PRN Meds:.acetaminophen, chlorpheniramine-HYDROcodone, guaiFENesin-dextromethorphan, LORazepam, ondansetron **OR** ondansetron (ZOFRAN) IV   Time Spent in minutes  25  See all Orders from today for further details   Lala Lund M.D on 12/20/2018 at 9:22 AM  To page go to [email or web address] - use universal password  [facility] -  Office  [phone]    Objective:   Vitals:   12/19/18 1600 12/19/18 1955 12/20/18 0716 12/20/18 0749  BP:  133/72 137/71   Pulse: 71 80    Resp: 19 20 17    Temp:  97.8 F (36.6 C) 98.6 F (37 C)   TempSrc:  Oral Oral   SpO2: 94% 91% 93% 90%  Weight:      Height:        Wt Readings from Last 3 Encounters:  12/17/18 (!) 142.5 kg  12/17/18 (!) 139 kg  11/30/18 (!) 146.1 kg     Intake/Output Summary (Last 24 hours) at  12/20/2018 0922 Last data filed at 12/20/2018 0445 Gross per 24 hour  Intake 660 ml  Output 400 ml  Net 260 ml     Physical Exam  Awake but pleasantly confused, No new F.N deficits,  [place].AT,PERRAL Supple Neck,No JVD, No cervical lymphadenopathy appriciated.  Symmetrical Chest wall movement, Good air movement bilaterally, CTAB RRR,No Gallops, Rubs or new Murmurs, No Parasternal Heave +ve B.Sounds, Abd Soft, No tenderness, No organomegaly appriciated, No rebound - guarding or rigidity. No Cyanosis, Clubbing or edema, No new Rash or bruise    Data Review:    CBC Recent Labs  Lab 12/17/18 0828 12/18/18 0930 12/19/18 0605 12/20/18 0612  WBC 19.5* 7.8 7.9 8.2  HGB 16.5*  13.3 13.6 13.8  HCT 49.6* 42.2 43.3 44.4  PLT 243 121* 142* 120*  MCV 88.4 91.7 92.1 92.5  MCH 29.4 28.9 28.9 28.8  MCHC 33.3 31.5 31.4 31.1  RDW 15.3 15.0 15.0 15.0  LYMPHSABS 1.6 0.4* 0.5* 0.4*  MONOABS 0.9 0.2 0.3 0.3  EOSABS 0.0 0.0 0.0 0.0  BASOSABS 0.1 0.0 0.0 0.0    Chemistries  Recent Labs  Lab 12/17/18 0946 12/18/18 0930 12/19/18 0605 12/20/18 0612  NA 137 137 138 140  K 5.7* 4.6 4.3 4.7  CL 92* 101 101 103  CO2 25 23 24 23   GLUCOSE 257* 220* 192* 207*  BUN 80* 71* 58* 60*  CREATININE 3.63* 2.51* 1.87* 1.82*  CALCIUM 9.5 8.3* 8.5* 8.8*  MG 2.4  --   --   --   AST 25 13* 16 25  ALT 17 19 19 30   ALKPHOS 50 43 42 44  BILITOT 1.3* 1.1 0.9 0.8   ------------------------------------------------------------------------------------------------------------------ Recent Labs    12/17/18 0946  TRIG 428*    Lab Results  Component Value Date   HGBA1C 7.5 (H) 12/19/2018   ------------------------------------------------------------------------------------------------------------------ Recent Labs    12/17/18 0946  TSH 1.625   ------------------------------------------------------------------------------------------------------------------ Recent Labs    12/17/18 0946 12/20/18 0612  FERRITIN 790* 495*    Coagulation profile No results for input(s): INR, PROTIME in the last 168 hours.  Recent Labs    12/19/18 0605 12/20/18 0612  DDIMER 2.88* 2.49*    Cardiac Enzymes No results for input(s): CKMB, TROPONINI, MYOGLOBIN in the last 168 hours.  Invalid input(s): CK ------------------------------------------------------------------------------------------------------------------    Component Value Date/Time   BNP 268.0 (H) 12/17/2018 0946    Micro Results Recent Results (from the past 240 hour(s))  Blood Culture (routine x 2)     Status: None (Preliminary result)   Collection Time: 12/17/18  8:28 AM   Specimen: BLOOD  Result Value Ref Range  Status   Specimen Description BLOOD RIGHT FA  Final   Special Requests   Final    BOTTLES DRAWN AEROBIC AND ANAEROBIC Blood Culture results may not be optimal due to an inadequate volume of blood received in culture bottles   Culture   Final    NO GROWTH < 24 HOURS Performed at [facility], [address], [place], [place] [address]    Report Status PENDING  Incomplete  Blood Culture (routine x 2)     Status: None (Preliminary result)   Collection Time: 12/17/18  8:29 AM   Specimen: BLOOD  Result Value Ref Range Status   Specimen Description BLOOD LEFT FA  Final   Special Requests   Final    BOTTLES DRAWN AEROBIC AND ANAEROBIC Blood Culture results may not be optimal due to an inadequate volume of blood  received in culture bottles   Culture   Final    NO GROWTH < 24 HOURS Performed at [facility], [address] [place], [place] [address]    Report Status PENDING  Incomplete  SARS Coronavirus 2 by RT PCR (hospital order, performed in [facility] hospital lab) Nasopharyngeal Nasopharyngeal Swab     Status: Abnormal   Collection Time: 12/17/18  8:29 AM   Specimen: Nasopharyngeal Swab  Result Value Ref Range Status   SARS Coronavirus 2 POSITIVE (A) NEGATIVE Final    Comment: RESULT CALLED TO, READ BACK BY AND VERIFIED WITH: LORRIE LEMONS AT 9373 ON 12/17/2018 JJB (NOTE) If result is NEGATIVE SARS-CoV-2 target nucleic acids are NOT DETECTED. The SARS-CoV-2 RNA is generally detectable in upper and lower  respiratory specimens during the acute phase of infection. The lowest  concentration of SARS-CoV-2 viral copies this assay can detect is 250  copies / mL. A negative result does not preclude SARS-CoV-2 infection  and should not be used as the sole basis for treatment or other  patient management decisions.  A negative result may occur with  improper specimen collection / handling, submission of specimen other  than nasopharyngeal swab, presence of viral  mutation(s) within the  areas targeted by this assay, and inadequate number of viral copies  (<250 copies / mL). A negative result must be combined with clinical  observations, patient history, and epidemiological information. If result is POSITIVE SARS-CoV-2 target nucleic acids are DETECTED.  The SARS-CoV-2 RNA is generally detectable in upper and lower  respiratory specimens during the acute phase of infection.  Positive  results are indicative of active infection with SARS-CoV-2.  Clinical  correlation with patient history and other diagnostic information is  necessary to determine patient infection status.  Positive results do  not rule out bacterial infection or co-infection with other viruses. If result is PRESUMPTIVE POSTIVE SARS-CoV-2 nucleic acids MAY BE PRESENT.   A presumptive positive result was obtained on the submitted specimen  and confirmed on repeat testing.  While 2019 novel coronavirus  (SARS-CoV-2) nucleic acids may be present in the submitted sample  additional confirmatory testing may be necessary for epidemiological  and / or clinical management purposes  to differentiate between  SARS-CoV-2 and other Sarbecovirus currently known to infect humans.  If clinically indicated additional testing with an alternate test  methodology [phone])  is advised. The SARS-CoV-2 RNA is generally  detectable in upper and lower respiratory specimens during the acute  phase of infection. The expected result is Negative. Fact Sheet for Patients:  StrictlyIdeas.no Fact Sheet for Healthcare Providers: BankingDealers.co.za This test is not yet approved or cleared by the [place] FDA and has been authorized for detection and/or diagnosis of SARS-CoV-2 by FDA under an Emergency Use Authorization (EUA).  This EUA will remain in effect (meaning this test can be used) for the duration of the COVID-19 declaration under Section 564(b)(1)  of the Act, 21 U.S.C. section 360bbb-3(b)(1), unless the authorization is terminated or revoked sooner. Performed at [facility], [address], [place], [place] [address]   Urine culture     Status: Abnormal   Collection Time: 12/17/18  9:27 AM   Specimen: Urine, Clean Catch  Result Value Ref Range Status   Specimen Description   Final    URINE, CLEAN CATCH Performed at [facility] [address], [place], [place] [address]    Special Requests   Final    NONE Performed at [facility], [address] [place], [place] [address]    Culture >=100,000 COLONIES/mL KLEBSIELLA PNEUMONIAE (A)  Final   Report Status 12/19/2018 FINAL  Final   Organism ID, Bacteria KLEBSIELLA PNEUMONIAE (A)  Final      Susceptibility   Klebsiella pneumoniae - MIC*    AMPICILLIN RESISTANT Resistant     CEFAZOLIN <=4 SENSITIVE Sensitive     CEFTRIAXONE <=1 SENSITIVE Sensitive     CIPROFLOXACIN <=0.25 SENSITIVE Sensitive     GENTAMICIN <=1 SENSITIVE Sensitive     IMIPENEM <=0.25 SENSITIVE Sensitive     NITROFURANTOIN 64 INTERMEDIATE Intermediate     TRIMETH/SULFA <=20 SENSITIVE Sensitive     AMPICILLIN/SULBACTAM <=2 SENSITIVE Sensitive     PIP/TAZO <=4 SENSITIVE Sensitive     Extended ESBL NEGATIVE Sensitive     * >=100,000 COLONIES/mL KLEBSIELLA PNEUMONIAE  MRSA PCR Screening     Status: None   Collection Time: 12/17/18  1:03 PM   Specimen: Nasopharyngeal  Result Value Ref Range Status   MRSA by PCR NEGATIVE NEGATIVE Final    Comment:        The GeneXpert MRSA Assay (FDA approved for NASAL specimens only), is one component of a comprehensive MRSA colonization surveillance program. It is not intended to diagnose MRSA infection nor to guide or monitor treatment for MRSA infections. Performed at [facility], [address] [place], [place] [address]   MRSA PCR Screening     Status: None   Collection Time: 12/18/18  5:03 AM   Specimen:  Nasopharyngeal  Result Value Ref Range Status   MRSA by PCR NEGATIVE NEGATIVE Final    Comment:        The GeneXpert MRSA Assay (FDA approved for NASAL specimens only), is one component of a comprehensive MRSA colonization surveillance program. It is not intended to diagnose MRSA infection nor to guide or monitor treatment for MRSA infections. Performed at [facility], [place] [address] [place], [place] [address]     Radiology Reports Dg Chest 1 View  Result Date: 11/30/2018 CLINICAL DATA:  Diagnosed with COVID-19 2 days ago. Fever and increased weakness. EXAM: CHEST  1 VIEW COMPARISON:  February 20, 2018 FINDINGS: Mild cardiomegaly. The hila and mediastinum are normal. No nodules or masses. Vascular crowding in the medial right lung base. And no focal infiltrate noted. IMPRESSION: No active disease. Electronically Signed   By: Dorise Bullion III M.D   On: 11/30/2018 18:43   Dg Chest Port 1 View  Result Date: 12/17/2018 CLINICAL DATA:  Shortness of breath, hypoxia. EXAM: PORTABLE CHEST 1 VIEW COMPARISON:  11/30/2018 FINDINGS: Patient slightly rotated to the left. Lungs are hypoinflated and demonstrate bilateral patchy mixed interstitial airspace density. No definite effusion. Mild stable cardiomegaly. IMPRESSION: Hypoinflation with mild bilateral patchy mixed interstitial airspace density which may be due to infection. Electronically Signed   By: Marin Olp M.D.   On: 12/17/2018 08:54

## 2018-12-20 NOTE — [place] FL2 (Addendum)
[place] MEDICAID FL2 LEVEL OF CARE SCREENING TOOL     IDENTIFICATION  Patient Name: Deborah [name] Birthdate: 16-Jan-1931 Sex: female Admission Date (Current Location): 12/17/2018  [facility] and [place] Number:  Engineering geologist and Address:  [facility], [place] [address], [place], [place] [address]([facility])      Provider Number: [id number]  Attending Physician Name and Address:  Thurnell Lose, MD  Relative Name and Phone Number:  Lira Stephen, [phone]    Current Level of Care: Hospital Recommended Level of Care: [place] Prior Approval Number:    Date Approved/Denied:   PASRR Number: [id number]  Discharge Plan: SNF    Current Diagnoses: Patient Active Problem List   Diagnosis Date Noted  . Pneumonia due to COVID-19 virus 12/17/2018  . AKI (acute kidney injury) ([place]) 12/17/2018  . Pneumonia 02/19/2018  . Benign neoplasm of cecum   . Benign neoplasm of transverse colon   . Lewy body dementia without behavioral disturbance ([place])   . Palliative care by specialist   . DNR (do not resuscitate)   . Goals of care, counseling/discussion   . Sepsis ([place]) 02/24/2016  . Urinary tract infection without hematuria 02/24/2016  . Memory loss 07/18/2015  . Depression 01/12/2015  . Shoulder pain 12/17/2014  . Pain in shoulder 08/05/2014  . Gonalgia 07/21/2014  . History of knee surgery 07/21/2014  . Allergic rhinitis 07/16/2014  . Anxiety 07/16/2014  . Benign hypertension 07/16/2014  . Alteration in bowel elimination: incontinence 07/16/2014  . Chronic kidney disease (CKD), stage III (moderate) 07/16/2014  . Colitis 07/16/2014  . CN (constipation) 07/16/2014  . Diarrhea 07/16/2014  . Contracture of palmar fascia (Dupuytren's) 07/16/2014  . Hypercholesteremia 07/16/2014  . Hypoxia 07/16/2014  . Absence of bladder continence 07/16/2014  . Focal lymphocytic colitis 07/16/2014  . Extreme obesity 07/16/2014  .  Arthritis, degenerative 07/16/2014  . Disorder of peripheral nervous system 07/16/2014  . Neuralgia neuritis, sciatic nerve 07/16/2014  . Hyperparathyroidism, secondary ([place]) 07/16/2014  . Apnea, sleep 07/16/2014  . Subclinical hypothyroidism 07/16/2014  . Controlled type 2 diabetes mellitus ([place]) 07/16/2014  . 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 Urinary incontinence without sensory awareness 09/03/2012  . Palpitations 12/28/2010  . SOB (shortness of breath) 12/28/2010  . Overweight 08/02/2009    Orientation RESPIRATION BLADDER Height & Weight     Self, Time, Situation, Place(Intermittent Confusion)  Normal Incontinent Weight: (!) 314 lb 2.5 oz (142.5 kg) Height:  5' 2"  (157.5 cm)  BEHAVIORAL SYMPTOMS/MOOD NEUROLOGICAL BOWEL NUTRITION STATUS      Continent Diet(Regular)  AMBULATORY STATUS COMMUNICATION OF NEEDS Skin   Extensive Assist Verbally Skin abrasions(Right Elbow Skin Tear. MASD abdomen, groin,buttocks. Barrier cream.)                       Personal Care Assistance Level of Assistance  Bathing, Dressing, Feeding Bathing Assistance: Maximum assistance Feeding assistance: Independent Dressing Assistance: Maximum assistance     Functional Limitations Info             SPECIAL CARE FACTORS FREQUENCY  PT (By licensed PT), OT (By licensed OT)     PT Frequency: 5x/week OT Frequency: 5x/week            Contractures Contractures Info: Not present    Additional Factors Info  Code Status, Allergies, Isolation Precautions, Insulin Sliding Scale, Psychotropic Code Status Info: DNR Allergies Info: Ace Inhibitors Psychotropic Info: Lorazepam (Ativan) 0.28m PRN every 12 hours. Ecstilopram (  Lexapro) 39m daily. Insulin Sliding Scale Info: 0-15 3x a day with meals. 0-5 at bedtime. Isolation Precautions Info: Covid positive     Current Medications (12/20/2018):  This is the current hospital active medication list Current Facility-Administered Medications   Medication Dose Route Frequency Provider Last Rate Last Dose  . 0.9 %  sodium chloride infusion   Intravenous Continuous GJonetta Osgood MD 10 mL/hr at 12/19/18 0900    . acetaminophen (TYLENOL) tablet 650 mg  650 mg Oral Q6H PRN DDerrill KayA, MD      . carvedilol (COREG) tablet 3.125 mg  3.125 mg Oral BID MJoette CatchingT, MD   3.125 mg at 12/20/18 1100  . cefTRIAXone (ROCEPHIN) 1 g in sodium chloride 0.9 % 100 mL IVPB  1 g Intravenous Q24H SLala LundK, MD 200 mL/hr at 12/19/18 2240 1 g at 12/19/18 2240  . chlorpheniramine-HYDROcodone (TUSSIONEX) 10-8 MG/5ML suspension 5 mL  5 mL Oral Q12H PRN DPhillips Grout MD   5 mL at 12/19/18 2138  . cholestyramine (QUESTRAN) packet 4 g  4 g Oral Daily MCherene Altes MD   4 g at 12/20/18 1059  . dexamethasone (DECADRON) tablet 6 mg  6 mg Oral Q24H DDerrill KayA, MD   6 mg at 12/20/18 1101  . escitalopram (LEXAPRO) tablet 10 mg  10 mg Oral Daily MCherene Altes MD   10 mg at 12/20/18 1059  . guaiFENesin-dextromethorphan (ROBITUSSIN DM) 100-10 MG/5ML syrup 10 mL  10 mL Oral Q4H PRN DPhillips Grout MD      . heparin injection 7,500 Units  7,500 Units Subcutaneous Q8H MCherene Altes MD   7,500 Units at 12/20/18 1457  . insulin aspart (novoLOG) injection 0-15 Units  0-15 Units Subcutaneous TID WC MCherene Altes MD   8 Units at 12/20/18 1318  . insulin aspart (novoLOG) injection 0-5 Units  0-5 Units Subcutaneous QHS MCherene Altes MD   Stopped at 12/19/18 2139  . linagliptin (TRADJENTA) tablet 5 mg  5 mg Oral Daily MCherene Altes MD   5 mg at 12/20/18 1059  . LORazepam (ATIVAN) tablet 0.5 mg  0.5 mg Oral Q12H PRN MCherene Altes MD   0.5 mg at 12/19/18 2138  . ondansetron (ZOFRAN) tablet 4 mg  4 mg Oral Q6H PRN DPhillips Grout MD       Or  . ondansetron ([facility] injection 4 mg  4 mg Intravenous Q6H PRN DPhillips Grout MD   4 mg at 12/19/18 1605  . oxybutynin (DITROPAN-XL) 24 hr tablet 10 mg  10 mg Oral Daily  MCherene Altes MD   10 mg at 12/20/18 1059  . remdesivir 100 mg in sodium chloride 0.9 % 250 mL IVPB  100 mg Intravenous Q24H DDerrill KayA, MD 500 mL/hr at 12/20/18 1100 100 mg at 12/20/18 1100  . simvastatin (ZOCOR) tablet 20 mg  20 mg Oral QHS MCherene Altes MD   20 mg at 12/19/18 2138  . vitamin C (ASCORBIC ACID) tablet 500 mg  500 mg Oral Daily DDerrill KayA, MD   500 mg at 12/20/18 1059  . zinc sulfate capsule 220 mg  220 mg Oral Daily DPhillips Grout MD   220 mg at 12/20/18 1059     Discharge Medications: Please see discharge summary for a list of discharge medications.  Relevant Imaging Results:  Relevant Lab Results:   Additional Information ss# [id number] JKirstie Peri Student-Social Work

## 2018-12-20 NOTE — Evaluation (Signed)
Physical Therapy Evaluation Patient Details Name: Deborah [name] MRN: [id number] DOB: 1930-05-02 Today's Date: 12/20/2018   History of Present Illness  85 y/o female w/ hx of urinary incontinence, heredetary idiopathic peripheral neuropathy, sleep apnea, palpitations, hypothyroidism, OA, obesity, LE edema, HTN, DM, CKD III, anxiety, R shld surgery, B TKA, cardiac cath and back surgery. Presented to ED w/ SOB and fatigue, was initially seen in ED and dx with COVID 10/18 but was d/c home to SNF now returned w/ increasing weakness and SOB. admitted w/ acute hypoxic resp failure sec to COVID PNA.  Clinical Impression   Pt admitted with above diagnosis. PTA was living at [facility] and was quite independent at w/c level, was able to transfer to w/c and move about home. Pt currently with functional limitations due to the deficits listed below (see PT Problem List). Pt is quite confused and needs redirection to task multi times, also noted pt tends to take 02 off multi times needing education as why she needs it and to replace canula in nasal passage. Pt needing mod a for bed mob using bed fixtures, mod a x 2 to stand from bed and take small shuffled steps to recliner, pt is able to stand from recliner with CGA and RW. Pt will benefit from skilled PT to increase their independence and safety with mobility to allow discharge to the venue listed below.       Follow Up Recommendations SNF    Equipment Recommendations  None recommended by PT    Recommendations for Other Services       Precautions / Restrictions Precautions Precautions: Fall Precaution Comments: monitor 02, takes canula out Restrictions Weight Bearing Restrictions: No      Mobility  Bed Mobility Overal bed mobility: Needs Assistance Bed Mobility: Supine to Sit     Supine to sit: Mod assist     General bed mobility comments: uses HOE and bed rail to get to sitting  Transfers Overall transfer level: Needs assistance Equipment  used: Rolling walker (2 wheeled) Transfers: Stand Pivot Transfers;Sit to/from Stand Sit to Stand: Mod assist;+2 physical assistance;+2 safety/equipment Stand pivot transfers: +2 safety/equipment;+2 physical assistance;Mod assist       General transfer comment: able to stand from recliner with CGA and cues to walker, from bed needs mod a x 2 to stand and cues, able to take small shuffled steps from bed to recliner  Ambulation/Gait             General Gait Details: small shuffled steps from bed to recliner with mod a x 2 and RW w/ cues for safety  Stairs            Wheelchair Mobility    Modified Rankin (Stroke Patients Only)       Balance Overall balance assessment: Needs assistance Sitting-balance support: Feet unsupported;Bilateral upper extremity supported Sitting balance-Leahy Scale: Fair     Standing balance support: During functional activity;Bilateral upper extremity supported Standing balance-Leahy Scale: Poor Standing balance comment: unsafe w/o extrenal support                             Pertinent Vitals/Pain Pain Assessment: No/denies pain    Home Living Family/patient expects to be discharged to:: Skilled nursing facility                      Prior Function Level of Independence: Needs assistance(able to transfer to w/c and mobilize from there)  Gait / Transfers Assistance Needed: Transfered independently with AD  ADL's / Homemaking Assistance Needed: needs a with AD        Hand Dominance        Extremity/Trunk Assessment   Upper Extremity Assessment Upper Extremity Assessment: Defer to OT evaluation    Lower Extremity Assessment Lower Extremity Assessment: Generalized weakness       Communication   Communication: (confused needs to be instructed seveal times, multi modal)  Cognition Arousal/Alertness: Lethargic Behavior During Therapy: Anxious;Agitated;Restless Overall Cognitive Status: No family/caregiver  present to determine baseline cognitive functioning                                 General Comments: seems to be impaired compared to notes on baseline      General Comments General comments (skin integrity, edema, etc.): Pt was on 3L/min of 02 via [place] and noted to be taking 02 off multi times during session, educated pt on need to keep 02 in place and replaced 02 several times. Pt able to maintain sats in 80-90s, when taken off is when noted to be desat into low 80s, with return to nasal passage increases to high 80s-90s.    Exercises     Assessment/Plan    PT Assessment Patient needs continued PT services  PT Problem List Decreased strength;Decreased activity tolerance;Decreased balance;Decreased mobility;Decreased coordination;Decreased cognition;Decreased safety awareness       PT Treatment Interventions Functional mobility training;Therapeutic activities;Therapeutic exercise;Balance training;Neuromuscular re-education;Patient/family education    PT Goals (Current goals can be found in the Care Plan section)  Acute Rehab PT Goals Patient Stated Goal: go home and be able to do for herself PT Goal Formulation: With patient Time For Goal Achievement: 01/03/19 Potential to Achieve Goals: Fair    Frequency Min 2X/week   Barriers to discharge        Co-evaluation               AM-PAC PT "6 Clicks" Mobility  Outcome Measure Help needed turning from your back to your side while in a flat bed without using bedrails?: A Lot Help needed moving from lying on your back to sitting on the side of a flat bed without using bedrails?: A Lot Help needed moving to and from a bed to a chair (including a wheelchair)?: A Lot Help needed standing up from a chair using your arms (e.g., wheelchair or bedside chair)?: A Little Help needed to walk in hospital room?: A Lot Help needed climbing 3-5 steps with a railing? : Total 6 Click Score: 12    End of Session Equipment  Utilized During Treatment: Oxygen Activity Tolerance: Treatment limited secondary to medical complications (Comment);Patient limited by lethargy;Patient limited by fatigue Patient left: in chair;with call bell/phone within reach;with chair alarm set Nurse Communication: Mobility status PT Visit Diagnosis: Other abnormalities of gait and mobility (R26.89);Muscle weakness (generalized) (M62.81)    Time: 0973-5329 PT Time Calculation (min) (ACUTE ONLY): 21 min   Charges:   PT Evaluation $PT Eval Moderate Complexity: 1 Mod PT Treatments $Therapeutic Activity: 8-22 mins        Horald Chestnut, PT     Delford Field 12/20/2018, 10:40 AM

## 2018-12-21 LAB — CBC WITH DIFFERENTIAL/PLATELET
Abs Immature Granulocytes: 0.18 10*3/uL — ABNORMAL HIGH (ref 0.00–0.07)
Basophils Absolute: 0 10*3/uL (ref 0.0–0.1)
Basophils Relative: 0 %
Eosinophils Absolute: 0 10*3/uL (ref 0.0–0.5)
Eosinophils Relative: 0 %
HCT: 43.9 % (ref 36.0–46.0)
Hemoglobin: 13.6 g/dL (ref 12.0–15.0)
Immature Granulocytes: 2 %
Lymphocytes Relative: 6 %
Lymphs Abs: 0.6 10*3/uL — ABNORMAL LOW (ref 0.7–4.0)
MCH: 28.6 pg (ref 26.0–34.0)
MCHC: 31 g/dL (ref 30.0–36.0)
MCV: 92.4 fL (ref 80.0–100.0)
Monocytes Absolute: 0.4 10*3/uL (ref 0.1–1.0)
Monocytes Relative: 5 %
Neutro Abs: 8 10*3/uL — ABNORMAL HIGH (ref 1.7–7.7)
Neutrophils Relative %: 87 %
Platelets: 117 10*3/uL — ABNORMAL LOW (ref 150–400)
RBC: 4.75 MIL/uL (ref 3.87–5.11)
RDW: 15.1 % (ref 11.5–15.5)
WBC: 9.2 10*3/uL (ref 4.0–10.5)
nRBC: 0 % (ref 0.0–0.2)

## 2018-12-21 LAB — COMPREHENSIVE METABOLIC PANEL
ALT: 38 U/L (ref 0–44)
AST: 25 U/L (ref 15–41)
Albumin: 2.7 g/dL — ABNORMAL LOW (ref 3.5–5.0)
Alkaline Phosphatase: 40 U/L (ref 38–126)
Anion gap: 12 (ref 5–15)
BUN: 56 mg/dL — ABNORMAL HIGH (ref 8–23)
CO2: 24 mmol/L (ref 22–32)
Calcium: 8.9 mg/dL (ref 8.9–10.3)
Chloride: 104 mmol/L (ref 98–111)
Creatinine, Ser: 1.76 mg/dL — ABNORMAL HIGH (ref 0.44–1.00)
GFR calc Af Amer: 30 mL/min — ABNORMAL LOW (ref >60)
GFR calc non Af Amer: 26 mL/min — ABNORMAL LOW (ref >60)
Glucose, Bld: 183 mg/dL — ABNORMAL HIGH (ref 70–99)
Potassium: 5.2 mmol/L — ABNORMAL HIGH (ref 3.5–5.1)
Sodium: 140 mmol/L (ref 135–145)
Total Bilirubin: 1 mg/dL (ref 0.3–1.2)
Total Protein: 5.8 g/dL — ABNORMAL LOW (ref 6.5–8.1)

## 2018-12-21 LAB — D-DIMER, QUANTITATIVE: D-Dimer, Quant: 2.17 ug/mL-FEU — ABNORMAL HIGH (ref 0.00–0.50)

## 2018-12-21 LAB — GLUCOSE, CAPILLARY
Glucose-Capillary: 164 mg/dL — ABNORMAL HIGH (ref 70–99)
Glucose-Capillary: 186 mg/dL — ABNORMAL HIGH (ref 70–99)
Glucose-Capillary: 153 mg/dL — ABNORMAL HIGH (ref 70–99)

## 2018-12-21 LAB — C-REACTIVE PROTEIN: CRP: 0.8 mg/dL (ref <1.0)

## 2018-12-21 LAB — FERRITIN: Ferritin: 574 ng/mL — ABNORMAL HIGH (ref 11–307)

## 2018-12-21 MED ORDER — FLUDROCORTISONE ACETATE 0.1 MG PO TABS
0.1000 mg | ORAL_TABLET | Freq: Every day | ORAL | Status: DC
  Administered 2018-12-21 – 2018-12-23 (×3): 0.1 mg via ORAL
  Filled 2018-12-21 (×4): qty 1

## 2018-12-21 MED ORDER — SODIUM POLYSTYRENE SULFONATE 15 GM/60ML PO SUSP
30.0000 g | Freq: Once | ORAL | Status: AC
  Administered 2018-12-21: 09:00:00 30 g via ORAL
  Filled 2018-12-21: qty 120

## 2018-12-21 MED ORDER — FUROSEMIDE 10 MG/ML IJ SOLN
20.0000 mg | Freq: Once | INTRAMUSCULAR | Status: AC
  Administered 2018-12-21: 20 mg via INTRAVENOUS
  Filled 2018-12-21: qty 2

## 2018-12-21 NOTE — Plan of Care (Signed)
  Problem: Education: Goal: Knowledge of risk factors and measures for prevention of condition will improve Outcome: Progressing   Problem: Coping: Goal: Psychosocial and spiritual needs will be supported Outcome: Progressing   Problem: Respiratory: Goal: Will maintain a patent airway Outcome: Progressing Goal: Complications related to the disease process, condition or treatment will be avoided or minimized Outcome: Progressing   

## 2018-12-21 NOTE — Progress Notes (Signed)
PROGRESS NOTE                                                                                                                                                                                                             Patient Demographics:    Deborah [name], is a 85 y.o. female, DOB - 1930-10-28, GBE:[id number]  Outpatient Primary MD for the patient is Venia Carbon, MD   Admit date - 12/17/2018   LOS - 4  No chief complaint on file.      Brief Narrative: Patient is a 85 y.o. female with PMHx of obesity, CKD stage IIIb, mild dementia, chronic diastolic heart failure sent from SNF to [facility] ED for shortness of breath-found to have acute hypoxic respiratory failure with O2 saturation in the 70s secondary to COVID-19 pneumonia.   Subjective:   Patient in bed, appears comfortable, denies any headache, no fever, no chest pain or pressure, no shortness of breath , no abdominal pain. No focal weakness.   Assessment  & Plan :   Acute Hypoxic Resp Failure due to Covid 19 Viral pneumonia: she was started on treatment with IV steroids and remdesivir, initially required about 6 L nasal cannula oxygen now down to 1 to 2 L.  Clinically improving, encouraged to sit up in chair and daytime use flutter valve and I-S for pulmonary toiletry.  Finished remdesivir course and discharge.Marland Kitchen     COVID-19 Labs: Recent Labs    12/19/18 0605 12/20/18 0612 12/21/18 0001  DDIMER 2.88* 2.49* 2.17*  FERRITIN  --  495* 574*  CRP 1.1* 0.9 0.8    Lab Results  Component Value Date   SARSCOV2NAA POSITIVE (A) 12/17/2018     COVID-19 Medications: Steroids: 11/3>> Remdesivir: 11/3>> Actemra: Not given due to slow clinical improvement, concurrent UTI. Convalescent Plasma: Not given  research Studies:N/A    UTI: Klebsiella UTI, cultures noted.  Complete Rocephin treatment on 12/20/2018 then stop.  Clinically resolved.  AKI on CKD stage IIIb:  AKI likely hemodynamically mediated-after hydration creatinine back to baseline of 1.8.  Hyperkalemia: Likely RTA 4, Lasix and Kayexalate now, add florinef.  Chronic diastolic heart failure: Euvolemic on exam-since renal function improving-stop IV fluids.    HTN: Controlled-continue Coreg  DM-2 (A1c 5.7): CBGs relatively stable-continue SSI.  CBG (last 3)  Recent Labs    12/20/18 1735  12/20/18 2242 12/21/18 0709  GLUCAP 198* 186* 164*    Lewy body dementia: Supportive care  Debility/deconditioning: Secondary to acute illness-PT/OT ordered.  Obesity: BMI 57, follow with PCP for weight loss.     Condition - Stable  Family Communication  :  Spouse updated over the phone on 11/6.  11/7 - 9.26 am - full mailbox  Code Status :  DNR  Diet :  Diet Order            Diet Carb Modified Fluid consistency: Thin; Room service appropriate? Yes  Diet effective now               Disposition Plan  :  SNF  Barriers to discharge: Complete 5 days of IV Remdesivir, 12/23/18  Consults  :  None  Procedures  :  None  Antibiotics  :    Anti-infectives (From admission, onward)   Start     Dose/Rate Route Frequency Ordered Stop   12/19/18 1000  remdesivir 100 mg in sodium chloride 0.9 % 250 mL IVPB     100 mg 500 mL/hr over 30 Minutes Intravenous Every 24 hours 12/18/18 0355 12/23/18 0959   12/18/18 0430  remdesivir 200 mg in sodium chloride 0.9 % 250 mL IVPB     200 mg 500 mL/hr over 30 Minutes Intravenous Once 12/18/18 0355 12/18/18 0527   12/17/18 2300  cefTRIAXone (ROCEPHIN) 1 g in sodium chloride 0.9 % 100 mL IVPB     1 g 200 mL/hr over 30 Minutes Intravenous Every 24 hours 12/17/18 2222 12/20/18 2330     DVT Prophylaxis  :  Heparin   Inpatient Medications  Scheduled Meds: . carvedilol  3.125 mg Oral BID  . cholestyramine  4 g Oral Daily  . dexamethasone  6 mg Oral Q24H  . escitalopram  10 mg Oral Daily  . heparin injection (subcutaneous)  7,500 Units Subcutaneous  Q8H  . insulin aspart  0-15 Units Subcutaneous TID WC  . insulin aspart  0-5 Units Subcutaneous QHS  . linagliptin  5 mg Oral Daily  . oxybutynin  10 mg Oral Daily  . simvastatin  20 mg Oral QHS  . vitamin C  500 mg Oral Daily  . zinc sulfate  220 mg Oral Daily   Continuous Infusions: . sodium chloride 10 mL/hr at 12/19/18 0900  . remdesivir 100 mg in NS 250 mL Stopped (12/20/18 1900)   PRN Meds:.acetaminophen, chlorpheniramine-HYDROcodone, guaiFENesin-dextromethorphan, LORazepam, ondansetron **OR** ondansetron (ZOFRAN) IV   Time Spent in minutes  25  See all Orders from today for further details   Lala Lund M.D on 12/21/2018 at 9:05 AM  To page go to [email or web address] - use universal password  [facility] -  Office  [phone]    Objective:   Vitals:   12/20/18 1939 12/20/18 2200 12/21/18 0404 12/21/18 0713  BP: 119/60  111/71 107/68  Pulse: 80  83 69  Resp: 17  20 19   Temp: 97.6 F (36.4 C)  97.6 F (36.4 C) 98.8 F (37.1 C)  TempSrc: Oral  Oral Oral  SpO2: 90% 91% 92% 96%  Weight:      Height:        Wt Readings from Last 3 Encounters:  12/17/18 (!) 142.5 kg  12/17/18 (!) 139 kg  11/30/18 (!) 146.1 kg     Intake/Output Summary (Last 24 hours) at 12/21/2018 0905 Last data filed at 12/20/2018 1647 Gross per 24 hour  Intake -  Output 250 ml  Net -250  ml     Physical Exam  Awake but pleasantly confused, No new F.N deficits,  [place].AT,PERRAL Supple Neck,No JVD, No cervical lymphadenopathy appriciated.  Symmetrical Chest wall movement, Good air movement bilaterally, CTAB RRR,No Gallops, Rubs or new Murmurs, No Parasternal Heave +ve B.Sounds, Abd Soft, No tenderness, No organomegaly appriciated, No rebound - guarding or rigidity. No Cyanosis, Clubbing or edema, No new Rash or bruise   Data Review:    CBC Recent Labs  Lab 12/17/18 0828 12/18/18 0930 12/19/18 0605 12/20/18 0612 12/21/18 0001  WBC 19.5* 7.8 7.9 8.2 9.2  HGB 16.5* 13.3  13.6 13.8 13.6  HCT 49.6* 42.2 43.3 44.4 43.9  PLT 243 121* 142* 120* 117*  MCV 88.4 91.7 92.1 92.5 92.4  MCH 29.4 28.9 28.9 28.8 28.6  MCHC 33.3 31.5 31.4 31.1 31.0  RDW 15.3 15.0 15.0 15.0 15.1  LYMPHSABS 1.6 0.4* 0.5* 0.4* 0.6*  MONOABS 0.9 0.2 0.3 0.3 0.4  EOSABS 0.0 0.0 0.0 0.0 0.0  BASOSABS 0.1 0.0 0.0 0.0 0.0    Chemistries  Recent Labs  Lab 12/17/18 0946 12/18/18 0930 12/19/18 0605 12/20/18 0612 12/21/18 0001  NA 137 137 138 140 140  K 5.7* 4.6 4.3 4.7 5.2*  CL 92* 101 101 103 104  CO2 25 23 24 23 24   GLUCOSE 257* 220* 192* 207* 183*  BUN 80* 71* 58* 60* 56*  CREATININE 3.63* 2.51* 1.87* 1.82* 1.76*  CALCIUM 9.5 8.3* 8.5* 8.8* 8.9  MG 2.4  --   --   --   --   AST 25 13* 16 25 25   ALT 17 19 19 30  38  ALKPHOS 50 43 42 44 40  BILITOT 1.3* 1.1 0.9 0.8 1.0   ------------------------------------------------------------------------------------------------------------------ No results for input(s): CHOL, HDL, LDLCALC, TRIG, CHOLHDL, LDLDIRECT in the last 72 hours.  Lab Results  Component Value Date   HGBA1C 7.5 (H) 12/19/2018   ------------------------------------------------------------------------------------------------------------------ No results for input(s): TSH, T4TOTAL, T3FREE, THYROIDAB in the last 72 hours.  Invalid input(s): FREET3 ------------------------------------------------------------------------------------------------------------------ Recent Labs    12/20/18 0612 12/21/18 0001  FERRITIN 495* 574*    Coagulation profile No results for input(s): INR, PROTIME in the last 168 hours.  Recent Labs    12/20/18 0612 12/21/18 0001  DDIMER 2.49* 2.17*    Cardiac Enzymes No results for input(s): CKMB, TROPONINI, MYOGLOBIN in the last 168 hours.  Invalid input(s): CK ------------------------------------------------------------------------------------------------------------------    Component Value Date/Time   BNP 268.0 (H) 12/17/2018  0946    Micro Results Recent Results (from the past 240 hour(s))  Blood Culture (routine x 2)     Status: None (Preliminary result)   Collection Time: 12/17/18  8:28 AM   Specimen: BLOOD  Result Value Ref Range Status   Specimen Description BLOOD RIGHT FA  Final   Special Requests   Final    BOTTLES DRAWN AEROBIC AND ANAEROBIC Blood Culture results may not be optimal due to an inadequate volume of blood received in culture bottles   Culture   Final    NO GROWTH < 24 HOURS Performed at [facility], [address], [place], [place] [address]    Report Status PENDING  Incomplete  Blood Culture (routine x 2)     Status: None (Preliminary result)   Collection Time: 12/17/18  8:29 AM   Specimen: BLOOD  Result Value Ref Range Status   Specimen Description BLOOD LEFT FA  Final   Special Requests   Final    BOTTLES DRAWN AEROBIC AND  ANAEROBIC Blood Culture results may not be optimal due to an inadequate volume of blood received in culture bottles   Culture   Final    NO GROWTH < 24 HOURS Performed at [facility], [address] [place], [place] [address]    Report Status PENDING  Incomplete  SARS Coronavirus 2 by RT PCR (hospital order, performed in [facility] hospital lab) Nasopharyngeal Nasopharyngeal Swab     Status: Abnormal   Collection Time: 12/17/18  8:29 AM   Specimen: Nasopharyngeal Swab  Result Value Ref Range Status   SARS Coronavirus 2 POSITIVE (A) NEGATIVE Final    Comment: RESULT CALLED TO, READ BACK BY AND VERIFIED WITH: LORRIE LEMONS AT 8756 ON 12/17/2018 JJB (NOTE) If result is NEGATIVE SARS-CoV-2 target nucleic acids are NOT DETECTED. The SARS-CoV-2 RNA is generally detectable in upper and lower  respiratory specimens during the acute phase of infection. The lowest  concentration of SARS-CoV-2 viral copies this assay can detect is 250  copies / mL. A negative result does not preclude SARS-CoV-2 infection  and should not be used as the sole  basis for treatment or other  patient management decisions.  A negative result may occur with  improper specimen collection / handling, submission of specimen other  than nasopharyngeal swab, presence of viral mutation(s) within the  areas targeted by this assay, and inadequate number of viral copies  (<250 copies / mL). A negative result must be combined with clinical  observations, patient history, and epidemiological information. If result is POSITIVE SARS-CoV-2 target nucleic acids are DETECTED.  The SARS-CoV-2 RNA is generally detectable in upper and lower  respiratory specimens during the acute phase of infection.  Positive  results are indicative of active infection with SARS-CoV-2.  Clinical  correlation with patient history and other diagnostic information is  necessary to determine patient infection status.  Positive results do  not rule out bacterial infection or co-infection with other viruses. If result is PRESUMPTIVE POSTIVE SARS-CoV-2 nucleic acids MAY BE PRESENT.   A presumptive positive result was obtained on the submitted specimen  and confirmed on repeat testing.  While 2019 novel coronavirus  (SARS-CoV-2) nucleic acids may be present in the submitted sample  additional confirmatory testing may be necessary for epidemiological  and / or clinical management purposes  to differentiate between  SARS-CoV-2 and other Sarbecovirus currently known to infect humans.  If clinically indicated additional testing with an alternate test  methodology [phone])  is advised. The SARS-CoV-2 RNA is generally  detectable in upper and lower respiratory specimens during the acute  phase of infection. The expected result is Negative. Fact Sheet for Patients:  StrictlyIdeas.no Fact Sheet for Healthcare Providers: BankingDealers.co.za This test is not yet approved or cleared by the [place] FDA and has been authorized for detection  and/or diagnosis of SARS-CoV-2 by FDA under an Emergency Use Authorization (EUA).  This EUA will remain in effect (meaning this test can be used) for the duration of the COVID-19 declaration under Section 564(b)(1) of the Act, 21 U.S.C. section 360bbb-3(b)(1), unless the authorization is terminated or revoked sooner. Performed at [facility], [address], [place], [place] [address]   Urine culture     Status: Abnormal   Collection Time: 12/17/18  9:27 AM   Specimen: Urine, Clean Catch  Result Value Ref Range Status   Specimen Description   Final    URINE, CLEAN CATCH Performed at [facility], [address] [place], [place] [address]  Special Requests   Final    NONE Performed at [facility], [address] [place], [place] [address]    Culture >=100,000 COLONIES/mL KLEBSIELLA PNEUMONIAE (A)  Final   Report Status 12/19/2018 FINAL  Final   Organism ID, Bacteria KLEBSIELLA PNEUMONIAE (A)  Final      Susceptibility   Klebsiella pneumoniae - MIC*    AMPICILLIN RESISTANT Resistant     CEFAZOLIN <=4 SENSITIVE Sensitive     CEFTRIAXONE <=1 SENSITIVE Sensitive     CIPROFLOXACIN <=0.25 SENSITIVE Sensitive     GENTAMICIN <=1 SENSITIVE Sensitive     IMIPENEM <=0.25 SENSITIVE Sensitive     NITROFURANTOIN 64 INTERMEDIATE Intermediate     TRIMETH/SULFA <=20 SENSITIVE Sensitive     AMPICILLIN/SULBACTAM <=2 SENSITIVE Sensitive     PIP/TAZO <=4 SENSITIVE Sensitive     Extended ESBL NEGATIVE Sensitive     * >=100,000 COLONIES/mL KLEBSIELLA PNEUMONIAE  MRSA PCR Screening     Status: None   Collection Time: 12/17/18  1:03 PM   Specimen: Nasopharyngeal  Result Value Ref Range Status   MRSA by PCR NEGATIVE NEGATIVE Final    Comment:        The GeneXpert MRSA Assay (FDA approved for NASAL specimens only), is one component of a comprehensive MRSA colonization surveillance program. It is not intended to diagnose MRSA infection nor to guide or monitor  treatment for MRSA infections. Performed at [facility], [address] [place], [place] [address]   MRSA PCR Screening     Status: None   Collection Time: 12/18/18  5:03 AM   Specimen: Nasopharyngeal  Result Value Ref Range Status   MRSA by PCR NEGATIVE NEGATIVE Final    Comment:        The GeneXpert MRSA Assay (FDA approved for NASAL specimens only), is one component of a comprehensive MRSA colonization surveillance program. It is not intended to diagnose MRSA infection nor to guide or monitor treatment for MRSA infections. Performed at [facility], [place] [address], [place], [place] [address]     Radiology Reports Dg Chest 1 View  Result Date: 11/30/2018 CLINICAL DATA:  Diagnosed with COVID-19 2 days ago. Fever and increased weakness. EXAM: CHEST  1 VIEW COMPARISON:  February 20, 2018 FINDINGS: Mild cardiomegaly. The hila and mediastinum are normal. No nodules or masses. Vascular crowding in the medial right lung base. And no focal infiltrate noted. IMPRESSION: No active disease. Electronically Signed   By: Dorise Bullion III M.D   On: 11/30/2018 18:43   Dg Chest Port 1 View  Result Date: 12/17/2018 CLINICAL DATA:  Shortness of breath, hypoxia. EXAM: PORTABLE CHEST 1 VIEW COMPARISON:  11/30/2018 FINDINGS: Patient slightly rotated to the left. Lungs are hypoinflated and demonstrate bilateral patchy mixed interstitial airspace density. No definite effusion. Mild stable cardiomegaly. IMPRESSION: Hypoinflation with mild bilateral patchy mixed interstitial airspace density which may be due to infection. Electronically Signed   By: Marin Olp M.D.   On: 12/17/2018 08:54

## 2018-12-21 NOTE — Progress Notes (Signed)
A/O-self will follow simple direction. Informed/educated son of patient status, son apologize on missing the phone call from earlier, today. Son plans to call [facility] tomorrow.

## 2018-12-22 ENCOUNTER — Ambulatory Visit: Payer: Medicare Other | Admitting: Urology

## 2018-12-22 ENCOUNTER — Ambulatory Visit: Payer: Medicare Other | Admitting: Internal Medicine

## 2018-12-22 LAB — COMPREHENSIVE METABOLIC PANEL
ALT: 34 U/L (ref 0–44)
AST: 15 U/L (ref 15–41)
Albumin: 2.5 g/dL — ABNORMAL LOW (ref 3.5–5.0)
Alkaline Phosphatase: 42 U/L (ref 38–126)
Anion gap: 11 (ref 5–15)
BUN: 57 mg/dL — ABNORMAL HIGH (ref 8–23)
CO2: 24 mmol/L (ref 22–32)
Calcium: 8.5 mg/dL — ABNORMAL LOW (ref 8.9–10.3)
Chloride: 107 mmol/L (ref 98–111)
Creatinine, Ser: 2.04 mg/dL — ABNORMAL HIGH (ref 0.44–1.00)
GFR calc Af Amer: 25 mL/min — ABNORMAL LOW (ref >60)
GFR calc non Af Amer: 22 mL/min — ABNORMAL LOW (ref >60)
Glucose, Bld: 173 mg/dL — ABNORMAL HIGH (ref 70–99)
Potassium: 5 mmol/L (ref 3.5–5.1)
Sodium: 142 mmol/L (ref 135–145)
Total Bilirubin: 0.7 mg/dL (ref 0.3–1.2)
Total Protein: 5.1 g/dL — ABNORMAL LOW (ref 6.5–8.1)

## 2018-12-22 LAB — CULTURE, BLOOD (ROUTINE X 2)
Culture: NO GROWTH
Culture: NO GROWTH

## 2018-12-22 LAB — D-DIMER, QUANTITATIVE: D-Dimer, Quant: 1.67 ug/mL-FEU — ABNORMAL HIGH (ref 0.00–0.50)

## 2018-12-22 LAB — CBC WITH DIFFERENTIAL/PLATELET
Abs Immature Granulocytes: 0.11 10*3/uL — ABNORMAL HIGH (ref 0.00–0.07)
Basophils Absolute: 0 10*3/uL (ref 0.0–0.1)
Basophils Relative: 0 %
Eosinophils Absolute: 0 10*3/uL (ref 0.0–0.5)
Eosinophils Relative: 0 %
HCT: 41.2 % (ref 36.0–46.0)
Hemoglobin: 12.8 g/dL (ref 12.0–15.0)
Immature Granulocytes: 2 %
Lymphocytes Relative: 9 %
Lymphs Abs: 0.6 10*3/uL — ABNORMAL LOW (ref 0.7–4.0)
MCH: 29.2 pg (ref 26.0–34.0)
MCHC: 31.1 g/dL (ref 30.0–36.0)
MCV: 93.8 fL (ref 80.0–100.0)
Monocytes Absolute: 0.4 10*3/uL (ref 0.1–1.0)
Monocytes Relative: 6 %
Neutro Abs: 5.6 10*3/uL (ref 1.7–7.7)
Neutrophils Relative %: 83 %
Platelets: 92 10*3/uL — ABNORMAL LOW (ref 150–400)
RBC: 4.39 MIL/uL (ref 3.87–5.11)
RDW: 15.4 % (ref 11.5–15.5)
WBC: 6.7 10*3/uL (ref 4.0–10.5)
nRBC: 0 % (ref 0.0–0.2)

## 2018-12-22 LAB — GLUCOSE, CAPILLARY
Glucose-Capillary: 218 mg/dL — ABNORMAL HIGH (ref 70–99)
Glucose-Capillary: 160 mg/dL — ABNORMAL HIGH (ref 70–99)
Glucose-Capillary: 126 mg/dL — ABNORMAL HIGH (ref 70–99)
Glucose-Capillary: 171 mg/dL — ABNORMAL HIGH (ref 70–99)
Glucose-Capillary: 161 mg/dL — ABNORMAL HIGH (ref 70–99)

## 2018-12-22 LAB — FERRITIN: Ferritin: 380 ng/mL — ABNORMAL HIGH (ref 11–307)

## 2018-12-22 LAB — C-REACTIVE PROTEIN: CRP: 0.9 mg/dL (ref <1.0)

## 2018-12-22 MED ORDER — SODIUM POLYSTYRENE SULFONATE 15 GM/60ML PO SUSP
30.0000 g | Freq: Once | ORAL | Status: AC
  Administered 2018-12-22: 12:00:00 30 g via ORAL
  Filled 2018-12-22: qty 120

## 2018-12-22 MED ORDER — LACTATED RINGERS IV SOLN
INTRAVENOUS | Status: AC
  Administered 2018-12-22: 12:00:00 via INTRAVENOUS

## 2018-12-22 MED ORDER — LORAZEPAM 0.5 MG PO TABS: 0.2500 mg | ORAL_TABLET | Freq: Two times a day (BID) | ORAL | Status: DC | PRN

## 2018-12-22 NOTE — Progress Notes (Signed)
Spoke with son Legrand Como and gave update. States he has spoken with MD today and aware of possible discharge Wednesday.

## 2018-12-22 NOTE — Progress Notes (Addendum)
PROGRESS NOTE                                                                                                                                                                                                             Patient Demographics:    Deborah [name], is a 85 y.o. female, DOB - 03/20/1930, XTA:[id number]  Outpatient Primary MD for the patient is Venia Carbon, MD   Admit date - 12/17/2018   LOS - 5  No chief complaint on file.      Brief Narrative: Patient is a 85 y.o. female with PMHx of obesity, CKD stage IIIb, mild dementia, chronic diastolic heart failure sent from SNF to [facility] ED for shortness of breath-found to have acute hypoxic respiratory failure with O2 saturation in the 70s secondary to COVID-19 pneumonia.   Subjective:   Patient in bed, appears comfortable, denies any headache, no fever, no chest pain or pressure, no shortness of breath , no abdominal pain. No focal weakness.    Assessment  & Plan :   Acute Hypoxic Resp Failure due to Covid 19 Viral pneumonia: she was started on treatment with IV steroids and remdesivir, initially required about 6 L nasal cannula oxygen now down to 1 to 2 L.  Clinically improving, encouraged to sit up in chair and daytime use flutter valve and I-S for pulmonary toiletry.  Finished remdesivir course and discharge.Marland Kitchen     COVID-19 Labs: Recent Labs    12/20/18 0612 12/21/18 0001 12/22/18 0425  DDIMER 2.49* 2.17* 1.67*  FERRITIN 495* 574* 380*  CRP 0.9 0.8 0.9    Lab Results  Component Value Date   SARSCOV2NAA POSITIVE (A) 12/17/2018     COVID-19 Medications: Steroids: 11/3>> Remdesivir: 11/3>> Actemra: Not given due to slow clinical improvement, concurrent UTI. Convalescent Plasma: Not given  research Studies:N/A    UTI: Klebsiella UTI, cultures noted.  Complete Rocephin treatment on 12/20/2018 then stop.  Clinically resolved.  AKI on CKD stage IIIb:  AKI likely hemodynamically mediated-after hydration creatinine back to baseline of 1.8.  Hyperkalemia: Likely RTA 4, will repeat Kayexalate now, added florinef.  Chronic diastolic heart failure: Euvolemic on exam-since renal function improving-stop IV fluids.    HTN: Controlled-continue Coreg  DM-2 (A1c 5.7): CBGs relatively stable-continue SSI.  CBG (last 3)  Recent Labs    12/21/18 1619 12/21/18  2130 12/22/18 0713  GLUCAP 186* 153* 160*    Lewy body dementia: Supportive care  Debility/deconditioning: Secondary to acute illness-PT/OT ordered.  Obesity: BMI 57, follow with PCP for weight loss.     Condition - Stable  Family Communication  :  Spouse updated over the phone on 11/6.  11/7 - 9.26 am - full mailbox.  Nurses left for son Legrand Como on his cell phone on 12/22/2018 at 11 8 AM.  Code Status :  DNR  Diet :  Diet Order            Diet Carb Modified Fluid consistency: Thin; Room service appropriate? Yes  Diet effective now               Disposition Plan  :  SNF  Barriers to discharge: Complete 5 days of IV Remdesivir, 12/23/18  Consults  :  None  Procedures  :  None  Antibiotics  :    Anti-infectives (From admission, onward)   Start     Dose/Rate Route Frequency Ordered Stop   12/19/18 1000  remdesivir 100 mg in sodium chloride 0.9 % 250 mL IVPB     100 mg 500 mL/hr over 30 Minutes Intravenous Every 24 hours 12/18/18 0355 12/22/18 1040   12/18/18 0430  remdesivir 200 mg in sodium chloride 0.9 % 250 mL IVPB     200 mg 500 mL/hr over 30 Minutes Intravenous Once 12/18/18 0355 12/18/18 0527   12/17/18 2300  cefTRIAXone (ROCEPHIN) 1 g in sodium chloride 0.9 % 100 mL IVPB     1 g 200 mL/hr over 30 Minutes Intravenous Every 24 hours 12/17/18 2222 12/20/18 2330     DVT Prophylaxis  :  Heparin   Inpatient Medications  Scheduled Meds: . carvedilol  3.125 mg Oral BID  . cholestyramine  4 g Oral Daily  . dexamethasone  6 mg Oral Q24H  . escitalopram  10  mg Oral Daily  . fludrocortisone  0.1 mg Oral Daily  . heparin injection (subcutaneous)  7,500 Units Subcutaneous Q8H  . insulin aspart  0-15 Units Subcutaneous TID WC  . insulin aspart  0-5 Units Subcutaneous QHS  . linagliptin  5 mg Oral Daily  . oxybutynin  10 mg Oral Daily  . simvastatin  20 mg Oral QHS  . vitamin C  500 mg Oral Daily  . zinc sulfate  220 mg Oral Daily   Continuous Infusions: . sodium chloride 10 mL/hr at 12/19/18 0900   PRN Meds:.acetaminophen, chlorpheniramine-HYDROcodone, guaiFENesin-dextromethorphan, LORazepam, ondansetron **OR** ondansetron (ZOFRAN) IV   Time Spent in minutes  25  See all Orders from today for further details   Lala Lund M.D on 12/22/2018 at 11:05 AM  To page go to [email or web address] - use universal password  [facility] -  Office  [phone]    Objective:   Vitals:   12/21/18 2123 12/22/18 0100 12/22/18 0540 12/22/18 0715  BP:   (!) 94/57 110/62  Pulse: 70 73 75 64  Resp:      Temp:  98.3 F (36.8 C) 98.7 F (37.1 C) 98.2 F (36.8 C)  TempSrc:  Axillary Axillary Axillary  SpO2:  95% 94% 94%  Weight:      Height:        Wt Readings from Last 3 Encounters:  12/17/18 (!) 142.5 kg  12/17/18 (!) 139 kg  11/30/18 (!) 146.1 kg     Intake/Output Summary (Last 24 hours) at 12/22/2018 1105 Last data filed at 12/22/2018 0927 Gross per  24 hour  Intake 180 ml  Output 402 ml  Net -222 ml     Physical Exam  Awake but pleasantly confused, No new F.N deficits,  [place].AT,PERRAL Supple Neck,No JVD, No cervical lymphadenopathy appriciated.  Symmetrical Chest wall movement, Good air movement bilaterally, CTAB RRR,No Gallops, Rubs or new Murmurs, No Parasternal Heave +ve B.Sounds, Abd Soft, No tenderness, No organomegaly appriciated, No rebound - guarding or rigidity. No Cyanosis, Clubbing or edema, No new Rash or bruise    Data Review:    CBC Recent Labs  Lab 12/18/18 0930 12/19/18 0605 12/20/18 0612 12/21/18  0001 12/22/18 0425  WBC 7.8 7.9 8.2 9.2 6.7  HGB 13.3 13.6 13.8 13.6 12.8  HCT 42.2 43.3 44.4 43.9 41.2  PLT 121* 142* 120* 117* 92*  MCV 91.7 92.1 92.5 92.4 93.8  MCH 28.9 28.9 28.8 28.6 29.2  MCHC 31.5 31.4 31.1 31.0 31.1  RDW 15.0 15.0 15.0 15.1 15.4  LYMPHSABS 0.4* 0.5* 0.4* 0.6* 0.6*  MONOABS 0.2 0.3 0.3 0.4 0.4  EOSABS 0.0 0.0 0.0 0.0 0.0  BASOSABS 0.0 0.0 0.0 0.0 0.0    Chemistries  Recent Labs  Lab 12/17/18 0946 12/18/18 0930 12/19/18 0605 12/20/18 0612 12/21/18 0001 12/22/18 0425  NA 137 137 138 140 140 142  K 5.7* 4.6 4.3 4.7 5.2* 5.0  CL 92* 101 101 103 104 107  CO2 25 23 24 23 24 24   GLUCOSE 257* 220* 192* 207* 183* 173*  BUN 80* 71* 58* 60* 56* 57*  CREATININE 3.63* 2.51* 1.87* 1.82* 1.76* 2.04*  CALCIUM 9.5 8.3* 8.5* 8.8* 8.9 8.5*  MG 2.4  --   --   --   --   --   AST 25 13* 16 25 25 15   ALT 17 19 19 30  38 34  ALKPHOS 50 43 42 44 40 42  BILITOT 1.3* 1.1 0.9 0.8 1.0 0.7   ------------------------------------------------------------------------------------------------------------------ No results for input(s): CHOL, HDL, LDLCALC, TRIG, CHOLHDL, LDLDIRECT in the last 72 hours.  Lab Results  Component Value Date   HGBA1C 7.5 (H) 12/19/2018   ------------------------------------------------------------------------------------------------------------------ No results for input(s): TSH, T4TOTAL, T3FREE, THYROIDAB in the last 72 hours.  Invalid input(s): FREET3 ------------------------------------------------------------------------------------------------------------------ Recent Labs    12/21/18 0001 12/22/18 0425  FERRITIN 574* 380*    Coagulation profile No results for input(s): INR, PROTIME in the last 168 hours.  Recent Labs    12/21/18 0001 12/22/18 0425  DDIMER 2.17* 1.67*    Cardiac Enzymes No results for input(s): CKMB, TROPONINI, MYOGLOBIN in the last 168 hours.  Invalid input(s): CK  ------------------------------------------------------------------------------------------------------------------    Component Value Date/Time   BNP 268.0 (H) 12/17/2018 0946    Micro Results Recent Results (from the past 240 hour(s))  Blood Culture (routine x 2)     Status: None   Collection Time: 12/17/18  8:28 AM   Specimen: BLOOD  Result Value Ref Range Status   Specimen Description BLOOD RIGHT FA  Final   Special Requests   Final    BOTTLES DRAWN AEROBIC AND ANAEROBIC Blood Culture results may not be optimal due to an inadequate volume of blood received in culture bottles   Culture   Final    NO GROWTH 5 DAYS Performed at [facility], [address] [place],  [address]    Report Status 12/22/2018 FINAL  Final  Blood Culture (routine x 2)     Status: None   Collection Time: 12/17/18  8:29 AM   Specimen: BLOOD  Result  Value Ref Range Status   Specimen Description BLOOD LEFT FA  Final   Special Requests   Final    BOTTLES DRAWN AEROBIC AND ANAEROBIC Blood Culture results may not be optimal due to an inadequate volume of blood received in culture bottles   Culture   Final    NO GROWTH 5 DAYS Performed at [facility], [address] [place], [place] [address]    Report Status 12/22/2018 FINAL  Final  SARS Coronavirus 2 by RT PCR (hospital order, performed in [place] hospital lab) Nasopharyngeal Nasopharyngeal Swab     Status: Abnormal   Collection Time: 12/17/18  8:29 AM   Specimen: Nasopharyngeal Swab  Result Value Ref Range Status   SARS Coronavirus 2 POSITIVE (A) NEGATIVE Final    Comment: RESULT CALLED TO, READ BACK BY AND VERIFIED WITH: LORRIE LEMONS AT 2376 ON 12/17/2018 JJB (NOTE) If result is NEGATIVE SARS-CoV-2 target nucleic acids are NOT DETECTED. The SARS-CoV-2 RNA is generally detectable in upper and lower  respiratory specimens during the acute phase of infection. The lowest  concentration of SARS-CoV-2 viral copies this  assay can detect is 250  copies / mL. A negative result does not preclude SARS-CoV-2 infection  and should not be used as the sole basis for treatment or other  patient management decisions.  A negative result may occur with  improper specimen collection / handling, submission of specimen other  than nasopharyngeal swab, presence of viral mutation(s) within the  areas targeted by this assay, and inadequate number of viral copies  (<250 copies / mL). A negative result must be combined with clinical  observations, patient history, and epidemiological information. If result is POSITIVE SARS-CoV-2 target nucleic acids are DETECTED.  The SARS-CoV-2 RNA is generally detectable in upper and lower  respiratory specimens during the acute phase of infection.  Positive  results are indicative of active infection with SARS-CoV-2.  Clinical  correlation with patient history and other diagnostic information is  necessary to determine patient infection status.  Positive results do  not rule out bacterial infection or co-infection with other viruses. If result is PRESUMPTIVE POSTIVE SARS-CoV-2 nucleic acids MAY BE PRESENT.   A presumptive positive result was obtained on the submitted specimen  and confirmed on repeat testing.  While 2019 novel coronavirus  (SARS-CoV-2) nucleic acids may be present in the submitted sample  additional confirmatory testing may be necessary for epidemiological  and / or clinical management purposes  to differentiate between  SARS-CoV-2 and other Sarbecovirus currently known to infect humans.  If clinically indicated additional testing with an alternate test  methodology [phone])  is advised. The SARS-CoV-2 RNA is generally  detectable in upper and lower respiratory specimens during the acute  phase of infection. The expected result is Negative. Fact Sheet for Patients:  StrictlyIdeas.no Fact Sheet for Healthcare Providers:  BankingDealers.co.za This test is not yet approved or cleared by the [place] FDA and has been authorized for detection and/or diagnosis of SARS-CoV-2 by FDA under an Emergency Use Authorization (EUA).  This EUA will remain in effect (meaning this test can be used) for the duration of the COVID-19 declaration under Section 564(b)(1) of the Act, 21 U.S.C. section 360bbb-3(b)(1), unless the authorization is terminated or revoked sooner. Performed at [facility], [address], [place], [place] [address]   Urine culture     Status: Abnormal   Collection Time: 12/17/18  9:27 AM   Specimen: Urine, Clean Catch  Result Value Ref  Range Status   Specimen Description   Final    URINE, CLEAN CATCH Performed at [facility] [address] [place], [place] [address]    Special Requests   Final    NONE Performed at [facility], [address] [place], [place] [address]    Culture >=100,000 COLONIES/mL KLEBSIELLA PNEUMONIAE (A)  Final   Report Status 12/19/2018 FINAL  Final   Organism ID, Bacteria KLEBSIELLA PNEUMONIAE (A)  Final      Susceptibility   Klebsiella pneumoniae - MIC*    AMPICILLIN RESISTANT Resistant     CEFAZOLIN <=4 SENSITIVE Sensitive     CEFTRIAXONE <=1 SENSITIVE Sensitive     CIPROFLOXACIN <=0.25 SENSITIVE Sensitive     GENTAMICIN <=1 SENSITIVE Sensitive     IMIPENEM <=0.25 SENSITIVE Sensitive     NITROFURANTOIN 64 INTERMEDIATE Intermediate     TRIMETH/SULFA <=20 SENSITIVE Sensitive     AMPICILLIN/SULBACTAM <=2 SENSITIVE Sensitive     PIP/TAZO <=4 SENSITIVE Sensitive     Extended ESBL NEGATIVE Sensitive     * >=100,000 COLONIES/mL KLEBSIELLA PNEUMONIAE  MRSA PCR Screening     Status: None   Collection Time: 12/17/18  1:03 PM   Specimen: Nasopharyngeal  Result Value Ref Range Status   MRSA by PCR NEGATIVE NEGATIVE Final    Comment:        The GeneXpert MRSA Assay (FDA approved for NASAL specimens only),  is one component of a comprehensive MRSA colonization surveillance program. It is not intended to diagnose MRSA infection nor to guide or monitor treatment for MRSA infections. Performed at [facility], [address] [place], [place] [address]   MRSA PCR Screening     Status: None   Collection Time: 12/18/18  5:03 AM   Specimen: Nasopharyngeal  Result Value Ref Range Status   MRSA by PCR NEGATIVE NEGATIVE Final    Comment:        The GeneXpert MRSA Assay (FDA approved for NASAL specimens only), is one component of a comprehensive MRSA colonization surveillance program. It is not intended to diagnose MRSA infection nor to guide or monitor treatment for MRSA infections. Performed at [facility], [place] [address] [place], [place] [address]     Radiology Reports Dg Chest 1 View  Result Date: 11/30/2018 CLINICAL DATA:  Diagnosed with COVID-19 2 days ago. Fever and increased weakness. EXAM: CHEST  1 VIEW COMPARISON:  February 20, 2018 FINDINGS: Mild cardiomegaly. The hila and mediastinum are normal. No nodules or masses. Vascular crowding in the medial right lung base. And no focal infiltrate noted. IMPRESSION: No active disease. Electronically Signed   By: Dorise Bullion III M.D   On: 11/30/2018 18:43   Dg Chest Port 1 View  Result Date: 12/17/2018 CLINICAL DATA:  Shortness of breath, hypoxia. EXAM: PORTABLE CHEST 1 VIEW COMPARISON:  11/30/2018 FINDINGS: Patient slightly rotated to the left. Lungs are hypoinflated and demonstrate bilateral patchy mixed interstitial airspace density. No definite effusion. Mild stable cardiomegaly. IMPRESSION: Hypoinflation with mild bilateral patchy mixed interstitial airspace density which may be due to infection. Electronically Signed   By: Marin Olp M.D.   On: 12/17/2018 08:54

## 2018-12-22 NOTE — Progress Notes (Signed)
Pt sleeping & cuff on forearm

## 2018-12-23 LAB — GLUCOSE, CAPILLARY
Glucose-Capillary: 256 mg/dL — ABNORMAL HIGH (ref 70–99)
Glucose-Capillary: 226 mg/dL — ABNORMAL HIGH (ref 70–99)

## 2018-12-23 LAB — BASIC METABOLIC PANEL
Anion gap: 15 (ref 5–15)
BUN: 48 mg/dL — ABNORMAL HIGH (ref 8–23)
CO2: 22 mmol/L (ref 22–32)
Calcium: 8.1 mg/dL — ABNORMAL LOW (ref 8.9–10.3)
Chloride: 104 mmol/L (ref 98–111)
Creatinine, Ser: 1.56 mg/dL — ABNORMAL HIGH (ref 0.44–1.00)
GFR calc Af Amer: 35 mL/min — ABNORMAL LOW (ref >60)
GFR calc non Af Amer: 30 mL/min — ABNORMAL LOW (ref >60)
Glucose, Bld: 239 mg/dL — ABNORMAL HIGH (ref 70–99)
Potassium: 4.3 mmol/L (ref 3.5–5.1)
Sodium: 141 mmol/L (ref 135–145)

## 2018-12-23 LAB — MAGNESIUM: Magnesium: 2.2 mg/dL (ref 1.7–2.4)

## 2018-12-23 MED ORDER — LORAZEPAM 0.5 MG PO TABS: 0.5000 mg | ORAL_TABLET | Freq: Two times a day (BID) | ORAL | 0 refills | Status: DC | PRN

## 2018-12-23 NOTE — Discharge Summary (Signed)
Deborah [name] HQP:[id number] DOB: Dec 15, 1930 DOA: 12/17/2018  PCP: Venia Carbon, MD  Admit date: 12/17/2018  Discharge date: 12/23/2018  Admitted From: SNF   Disposition:  SNF   Recommendations for Outpatient Follow-up:   Follow up with PCP in 1-2 weeks  PCP Please obtain BMP/CBC, 2 view CXR in 1week,  (see Discharge instructions)   PCP Please follow up on the following pending results:    Home Health: None   Equipment/Devices: None  Consultations: None  Discharge Condition: Stable    CODE STATUS: Full    Diet Recommendation: Heart Healthy Low Carb  CC - Cough  Brief history of present illness from the day of admission and additional interim summary    Patient is a 85 y.o. female with PMHx of obesity, CKD stage IIIb, mild dementia, chronic diastolic heart failure sent from SNF to [facility] ED for shortness of breath-found to have acute hypoxic respiratory failure with O2 saturation in the 70s secondary to COVID-19 pneumonia.                                                                 Hospital Course   Acute Hypoxic Resp Failure due to Covid 19 Viral pneumonia: she was started on treatment with IV steroids and remdesivir, initially required about 6 L nasal cannula oxygen now down to 1 to 2 L.  Clinically improving and on RA -symptom free, she will finish her IV remdesivir course and discharge back to SNF.  COVID-19 Labs  Recent Labs    12/21/18 0001 12/22/18 0425  DDIMER 2.17* 1.67*  FERRITIN 574* 380*  CRP 0.8 0.9    Lab Results  Component Value Date   SARSCOV2NAA POSITIVE (A) 12/17/2018     UTI: Klebsiella UTI, cultures noted.  Completed Rocephin treatment.  AKI on CKD stage IIIb: AKI likely hemodynamically mediated-after hydration creatinine back to baseline of 1.8.  Hyperkalemia:  Likely RTA 4, resolved after Kayexalate, request SNF MD to monitor closely.  Chronic diastolic heart failure: Euvolemic on exam-since renal function improving-stop IV fluids.  Resume home dose diuretics upon discharge.   HTN: Controlled-continue Coreg  DM-2 (A1c 5.7):  Stable outpatient control continue home regimen upon discharge.  Check CBGs QA CHS at SNF.  Lewy body dementia: Supportive care  Debility/deconditioning: Secondary to acute illness-PT/OT ordered.  Continue at SNF.  Obesity: BMI 57, follow with PCP for weight loss.   Discharge diagnosis     Principal Problem:   Pneumonia due to COVID-19 virus Active Problems:   Benign hypertension   Chronic kidney disease (CKD), stage III (moderate)   Extreme obesity   Controlled type 2 diabetes mellitus (HCC)   Urinary tract infection without hematuria   Lewy body dementia without behavioral disturbance ([place])   DNR (do not resuscitate)   AKI (  acute kidney injury) [facility])    Discharge instructions    Discharge Instructions    Discharge instructions   Complete by: As directed    Follow with Primary MD Venia Carbon, MD in 7 days   Get CBC, CMP, 2 view Chest X ray -  checked next visit within 1 week by Primary MD or SNF MD   Activity: As tolerated with Full fall precautions use walker/cane & assistance as needed  Disposition SNF  Diet: Heart Healthy - Low Carb , with feeding assistance and aspiration precautions.  Special Instructions: If you have smoked or chewed Tobacco  in the last 2 yrs please stop smoking, stop any regular Alcohol  and or any Recreational drug use.  On your next visit with your primary care physician please Get Medicines reviewed and adjusted.  Please request your Prim.MD to go over all Hospital Tests and Procedure/Radiological results at the follow up, please get all Hospital records sent to your Prim MD by signing hospital release before you go home.  If you experience worsening of your  admission symptoms, develop shortness of breath, life threatening emergency, suicidal or homicidal thoughts you must seek medical attention immediately by calling 911 or calling your MD immediately  if symptoms less severe.  You Must read complete instructions/literature along with all the possible adverse reactions/side effects for all the Medicines you take and that have been prescribed to you. Take any new Medicines after you have completely understood and accpet all the possible adverse reactions/side effects.   Increase activity slowly   Complete by: As directed       Discharge Medications   Allergies as of 12/23/2018      Reactions   Ace Inhibitors Cough      Medication List    STOP taking these medications   azithromycin 250 MG tablet Commonly known as: ZITHROMAX   dexamethasone 6 MG tablet Commonly known as: DECADRON   Norco 5-325 MG tablet Generic drug: HYDROcodone-acetaminophen     TAKE these medications   acetaminophen 500 MG tablet Commonly known as: TYLENOL Take 500 mg by mouth every 6 (six) hours as needed for mild pain.   Align 4 MG Caps Take 1 capsule by mouth daily.   budesonide 3 MG 24 hr capsule Commonly known as: ENTOCORT EC Take 3 mg by mouth daily.   carvedilol 3.125 MG tablet Commonly known as: Coreg Take 1 tablet (3.125 mg total) by mouth 2 (two) times daily.   cholestyramine 4 GM/DOSE powder Commonly known as: QUESTRAN Take 4 g by mouth as needed.   Cranberry 425 MG Caps Take 425 mg by mouth daily at 6 (six) AM.   escitalopram 10 MG tablet Commonly known as: LEXAPRO Take 10 mg by mouth daily.   glipiZIDE 5 MG tablet Commonly known as: GLUCOTROL Take 2.5 mg by mouth 2 (two) times daily before a meal.   guaifenesin 100 MG/5ML syrup Commonly known as: ROBITUSSIN Take 200 mg by mouth every 4 (four) hours as needed for cough.   ipratropium-albuterol 0.5-2.5 (3) MG/3ML Soln Commonly known as: DUONEB Take 3 mLs by nebulization every 8  (eight) hours as needed (shortness of breath).   loperamide 2 MG tablet Commonly known as: Imodium A-D Take 1 tablet (2 mg total) by mouth 4 (four) times daily as needed for diarrhea or loose stools.   loratadine 10 MG tablet Commonly known as: CLARITIN Take 10 mg by mouth every evening.   LORazepam 0.5 MG tablet Commonly known as:  ATIVAN Take 0.5 mg by mouth every 12 (twelve) hours as needed for anxiety.   nystatin powder Commonly known as: MYCOSTATIN/NYSTOP Apply topically under pannus and underbilateral breast twice daily as needed for yeast rash   oxybutynin 10 MG 24 hr tablet Commonly known as: DITROPAN-XL Take 1 tablet (10 mg total) by mouth daily.   simvastatin 20 MG tablet Commonly known as: ZOCOR Take 20 mg by mouth at bedtime.   spironolactone 25 MG tablet Commonly known as: ALDACTONE Take 25 mg by mouth daily.   torsemide 20 MG tablet Commonly known as: DEMADEX Take 40 mg by mouth daily.   triamcinolone lotion 0.1 % Commonly known as: KENALOG Apply 1 application topically 3 (three) times daily. Apply to Bilateral Calves topically every 8 hours as needed for stasis-dermatitis   trimethoprim 100 MG tablet Commonly known as: TRIMPEX Take 100 mg by mouth daily.        Contact information for follow-up providers    Venia Carbon, MD. Schedule an appointment as soon as possible for a visit in 1 week(s).   Specialties: Internal Medicine, Pediatrics Contact information: [place] [address] [phone]            Contact information for after-discharge care    Destination    [facility] .   Service: Skilled Nursing Contact information: [place] [phone]                  Major procedures and Radiology Reports - PLEASE review detailed and final reports thoroughly  -       Dg Chest 1 View  Result Date: 11/30/2018 CLINICAL DATA:  Diagnosed with  COVID-19 2 days ago. Fever and increased weakness. EXAM: CHEST  1 VIEW COMPARISON:  February 20, 2018 FINDINGS: Mild cardiomegaly. The hila and mediastinum are normal. No nodules or masses. Vascular crowding in the medial right lung base. And no focal infiltrate noted. IMPRESSION: No active disease. Electronically Signed   By: Dorise Bullion III M.D   On: 11/30/2018 18:43   Dg Chest Port 1 View  Result Date: 12/17/2018 CLINICAL DATA:  Shortness of breath, hypoxia. EXAM: PORTABLE CHEST 1 VIEW COMPARISON:  11/30/2018 FINDINGS: Patient slightly rotated to the left. Lungs are hypoinflated and demonstrate bilateral patchy mixed interstitial airspace density. No definite effusion. Mild stable cardiomegaly. IMPRESSION: Hypoinflation with mild bilateral patchy mixed interstitial airspace density which may be due to infection. Electronically Signed   By: Marin Olp M.D.   On: 12/17/2018 08:54    Micro Results    Recent Results (from the past 240 hour(s))  Blood Culture (routine x 2)     Status: None   Collection Time: 12/17/18  8:28 AM   Specimen: BLOOD  Result Value Ref Range Status   Specimen Description BLOOD RIGHT FA  Final   Special Requests   Final    BOTTLES DRAWN AEROBIC AND ANAEROBIC Blood Culture results may not be optimal due to an inadequate volume of blood received in culture bottles   Culture   Final    NO GROWTH 5 DAYS Performed at [facility], [address] [place], [place] [address]    Report Status 12/22/2018 FINAL  Final  Blood Culture (routine x 2)     Status: None   Collection Time: 12/17/18  8:29 AM   Specimen: BLOOD  Result Value Ref Range Status   Specimen Description BLOOD LEFT FA  Final  Special Requests   Final    BOTTLES DRAWN AEROBIC AND ANAEROBIC Blood Culture results may not be optimal due to an inadequate volume of blood received in culture bottles   Culture   Final    NO GROWTH 5 DAYS Performed at [facility], [address]  [place], [place] [address]    Report Status 12/22/2018 FINAL  Final  SARS Coronavirus 2 by RT PCR (hospital order, performed in [place] hospital lab) Nasopharyngeal Nasopharyngeal Swab     Status: Abnormal   Collection Time: 12/17/18  8:29 AM   Specimen: Nasopharyngeal Swab  Result Value Ref Range Status   SARS Coronavirus 2 POSITIVE (A) NEGATIVE Final    Comment: RESULT CALLED TO, READ BACK BY AND VERIFIED WITH: LORRIE LEMONS AT 3254 ON 12/17/2018 JJB (NOTE) If result is NEGATIVE SARS-CoV-2 target nucleic acids are NOT DETECTED. The SARS-CoV-2 RNA is generally detectable in upper and lower  respiratory specimens during the acute phase of infection. The lowest  concentration of SARS-CoV-2 viral copies this assay can detect is 250  copies / mL. A negative result does not preclude SARS-CoV-2 infection  and should not be used as the sole basis for treatment or other  patient management decisions.  A negative result may occur with  improper specimen collection / handling, submission of specimen other  than nasopharyngeal swab, presence of viral mutation(s) within the  areas targeted by this assay, and inadequate number of viral copies  (<250 copies / mL). A negative result must be combined with clinical  observations, patient history, and epidemiological information. If result is POSITIVE SARS-CoV-2 target nucleic acids are DETECTED.  The SARS-CoV-2 RNA is generally detectable in upper and lower  respiratory specimens during the acute phase of infection.  Positive  results are indicative of active infection with SARS-CoV-2.  Clinical  correlation with patient history and other diagnostic information is  necessary to determine patient infection status.  Positive results do  not rule out bacterial infection or co-infection with other viruses. If result is PRESUMPTIVE POSTIVE SARS-CoV-2 nucleic acids MAY BE PRESENT.   A presumptive positive result was obtained on the submitted specimen  and  confirmed on repeat testing.  While 2019 novel coronavirus  (SARS-CoV-2) nucleic acids may be present in the submitted sample  additional confirmatory testing may be necessary for epidemiological  and / or clinical management purposes  to differentiate between  SARS-CoV-2 and other Sarbecovirus currently known to infect humans.  If clinically indicated additional testing with an alternate test  methodology [phone])  is advised. The SARS-CoV-2 RNA is generally  detectable in upper and lower respiratory specimens during the acute  phase of infection. The expected result is Negative. Fact Sheet for Patients:  StrictlyIdeas.no Fact Sheet for Healthcare Providers: BankingDealers.co.za This test is not yet approved or cleared by the [place] FDA and has been authorized for detection and/or diagnosis of SARS-CoV-2 by FDA under an Emergency Use Authorization (EUA).  This EUA will remain in effect (meaning this test can be used) for the duration of the COVID-19 declaration under Section 564(b)(1) of the Act, 21 U.S.C. section 360bbb-3(b)(1), unless the authorization is terminated or revoked sooner. Performed at [facility], [address], [place], [place] [address]   Urine culture     Status: Abnormal   Collection Time: 12/17/18  9:27 AM   Specimen: Urine, Clean Catch  Result Value Ref Range Status   Specimen Description   Final    URINE, CLEAN CATCH  Performed at [facility], [address] [place], [place] [address]    Special Requests   Final    NONE Performed at [facility], [address] [place], [place] [address]    Culture >=100,000 COLONIES/mL KLEBSIELLA PNEUMONIAE (A)  Final   Report Status 12/19/2018 FINAL  Final   Organism ID, Bacteria KLEBSIELLA PNEUMONIAE (A)  Final      Susceptibility   Klebsiella pneumoniae - MIC*    AMPICILLIN RESISTANT Resistant     CEFAZOLIN <=4 SENSITIVE  Sensitive     CEFTRIAXONE <=1 SENSITIVE Sensitive     CIPROFLOXACIN <=0.25 SENSITIVE Sensitive     GENTAMICIN <=1 SENSITIVE Sensitive     IMIPENEM <=0.25 SENSITIVE Sensitive     NITROFURANTOIN 64 INTERMEDIATE Intermediate     TRIMETH/SULFA <=20 SENSITIVE Sensitive     AMPICILLIN/SULBACTAM <=2 SENSITIVE Sensitive     PIP/TAZO <=4 SENSITIVE Sensitive     Extended ESBL NEGATIVE Sensitive     * >=100,000 COLONIES/mL KLEBSIELLA PNEUMONIAE  MRSA PCR Screening     Status: None   Collection Time: 12/17/18  1:03 PM   Specimen: Nasopharyngeal  Result Value Ref Range Status   MRSA by PCR NEGATIVE NEGATIVE Final    Comment:        The GeneXpert MRSA Assay (FDA approved for NASAL specimens only), is one component of a comprehensive MRSA colonization surveillance program. It is not intended to diagnose MRSA infection nor to guide or monitor treatment for MRSA infections. Performed at [facility], [address] [place], [place] [address]   MRSA PCR Screening     Status: None   Collection Time: 12/18/18  5:03 AM   Specimen: Nasopharyngeal  Result Value Ref Range Status   MRSA by PCR NEGATIVE NEGATIVE Final    Comment:        The GeneXpert MRSA Assay (FDA approved for NASAL specimens only), is one component of a comprehensive MRSA colonization surveillance program. It is not intended to diagnose MRSA infection nor to guide or monitor treatment for MRSA infections. Performed at [facility], [place] [address] [place], [place] [address]     Today   Subjective    Deborah [name] today has no headache,no chest abdominal pain,no new weakness tingling or numbness, feels much better     Objective   Blood pressure (!) 140/59, pulse 81, temperature 99 F (37.2 C), temperature source Oral, resp. rate 20, height 5' 2"  (1.575 m), weight (!) 142.5 kg, SpO2 94 %.   Intake/Output Summary (Last 24 hours) at 12/23/2018 0917 Last data filed at 12/23/2018  0500 Gross per 24 hour  Intake 1487.78 ml  Output 850 ml  Net 637.78 ml    Exam  Awake Alert,  No new F.N deficits, Normal affect .AT,PERRAL Supple Neck,No JVD, No cervical lymphadenopathy appriciated.  Symmetrical Chest wall movement, Good air movement bilaterally, CTAB RRR,No Gallops,Rubs or new Murmurs, No Parasternal Heave +ve B.Sounds, Abd Soft, Non tender, No organomegaly appriciated, No rebound -guarding or rigidity. No Cyanosis, Clubbing or edema, No new Rash or bruise   Data Review   CBC w Diff:  Lab Results  Component Value Date   WBC 6.7 12/22/2018   HGB 12.8 12/22/2018   HGB 12.0 01/19/2016   HCT 41.2 12/22/2018   HCT 37.4 01/19/2016   PLT 92 (L) 12/22/2018   PLT 227 01/19/2016   LYMPHOPCT 9 12/22/2018   LYMPHOPCT 15.8 05/28/2014   MONOPCT 6 12/22/2018   MONOPCT 6.5 05/28/2014  EOSPCT 0 12/22/2018   EOSPCT 2.6 05/28/2014   BASOPCT 0 12/22/2018   BASOPCT 0.9 05/28/2014    CMP:  Lab Results  Component Value Date   NA 141 12/23/2018   NA 144 01/19/2016   NA 139 05/28/2014   K 4.3 12/23/2018   K 3.3 (L) 05/28/2014   CL 104 12/23/2018   CL 101 05/28/2014   CO2 22 12/23/2018   CO2 31 05/28/2014   BUN 48 (H) 12/23/2018   BUN 14 01/19/2016   BUN 21 (H) 05/28/2014   CREATININE 1.56 (H) 12/23/2018   CREATININE 1.29 (H) 05/28/2014   GLU 107 01/03/2016   PROT 5.1 (L) 12/22/2018   PROT 6.3 12/02/2015   PROT 6.9 05/28/2014   ALBUMIN 2.5 (L) 12/22/2018   ALBUMIN 3.5 01/19/2016   ALBUMIN 3.7 05/28/2014   BILITOT 0.7 12/22/2018   BILITOT 0.4 12/02/2015   BILITOT 0.6 05/28/2014   ALKPHOS 42 12/22/2018   ALKPHOS 54 05/28/2014   AST 15 12/22/2018   AST 17 05/28/2014   ALT 34 12/22/2018   ALT 15 05/28/2014  .   Total Time in preparing paper work, data evaluation and todays exam - 59 minutes  Lala Lund M.D on 12/23/2018 at 9:17 AM  [facility]   Office  [phone]

## 2018-12-23 NOTE — Discharge Instructions (Signed)
Follow with Primary MD Venia Carbon, MD in 7 days   Get CBC, CMP, 2 view Chest X ray -  checked next visit within 1 week by Primary MD or SNF MD   Activity: As tolerated with Full fall precautions use walker/cane & assistance as needed  Disposition SNF  Diet: Heart Healthy - Low Carb , with feeding assistance and aspiration precautions.  Special Instructions: If you have smoked or chewed Tobacco  in the last 2 yrs please stop smoking, stop any regular Alcohol  and or any Recreational drug use.  On your next visit with your primary care physician please Get Medicines reviewed and adjusted.  Please request your Prim.MD to go over all Hospital Tests and Procedure/Radiological results at the follow up, please get all Hospital records sent to your Prim MD by signing hospital release before you go home.  If you experience worsening of your admission symptoms, develop shortness of breath, life threatening emergency, suicidal or homicidal thoughts you must seek medical attention immediately by calling 911 or calling your MD immediately  if symptoms less severe.  You Must read complete instructions/literature along with all the possible adverse reactions/side effects for all the Medicines you take and that have been prescribed to you. Take any new Medicines after you have completely understood and accpet all the possible adverse reactions/side effects.        Person Under Monitoring Name: [name]  Location: [address] [place] [address]   Infection Prevention Recommendations for Individuals Confirmed to have, or Being Evaluated for, 2019 Novel Coronavirus (COVID-19) Infection Who Receive Care at Home  Individuals who are confirmed to have, or are being evaluated for, COVID-19 should follow the prevention steps below until a healthcare provider or local or state health department says they can return to normal activities.  Stay home except to get medical care You  should restrict activities outside your home, except for getting medical care. Do not go to work, school, or public areas, and do not use public transportation or taxis.  Call ahead before visiting your doctor Before your medical appointment, call the healthcare provider and tell them that you have, or are being evaluated for, COVID-19 infection. This will help the healthcare providers office take steps to keep other people from getting infected. Ask your healthcare provider to call the local or state health department.  Monitor your symptoms Seek prompt medical attention if your illness is worsening (e.g., difficulty breathing). Before going to your medical appointment, call the healthcare provider and tell them that you have, or are being evaluated for, COVID-19 infection. Ask your healthcare provider to call the local or state health department.  Wear a facemask You should wear a facemask that covers your nose and mouth when you are in the same room with other people and when you visit a healthcare provider. People who live with or visit you should also wear a facemask while they are in the same room with you.  Separate yourself from other people in your home As much as possible, you should stay in a different room from other people in your home. Also, you should use a separate bathroom, if available.  Avoid sharing household items You should not share dishes, drinking glasses, cups, eating utensils, towels, bedding, or other items with other people in your home. After using these items, you should wash them thoroughly with soap and water.  Cover your coughs and sneezes Cover your mouth and nose with a  tissue when you cough or sneeze, or you can cough or sneeze into your sleeve. Throw used tissues in a lined trash can, and immediately wash your hands with soap and water for at least 20 seconds or use an alcohol-based hand rub.  Wash your [facility] your hands often and thoroughly  with soap and water for at least 20 seconds. You can use an alcohol-based hand sanitizer if soap and water are not available and if your hands are not visibly dirty. Avoid touching your eyes, nose, and mouth with unwashed hands.   Prevention Steps for Caregivers and Household Members of Individuals Confirmed to have, or Being Evaluated for, COVID-19 Infection Being Cared for in the Home  If you live with, or provide care at home for, a person confirmed to have, or being evaluated for, COVID-19 infection please follow these guidelines to prevent infection:  Follow healthcare providers instructions Make sure that you understand and can help the patient follow any healthcare provider instructions for all care.  Provide for the patients basic needs You should help the patient with basic needs in the home and provide support for getting groceries, prescriptions, and other personal needs.  Monitor the patients symptoms If they are getting sicker, call his or her medical provider and tell them that the patient has, or is being evaluated for, COVID-19 infection. This will help the healthcare providers office take steps to keep other people from getting infected. Ask the healthcare provider to call the local or state health department.  Limit the number of people who have contact with the patient  If possible, have only one caregiver for the patient.  Other household members should stay in another home or place of residence. If this is not possible, they should stay  in another room, or be separated from the patient as much as possible. Use a separate bathroom, if available.  Restrict visitors who do not have an essential need to be in the home.  Keep older adults, very young children, and other sick people away from the patient Keep older adults, very young children, and those who have compromised immune systems or chronic health conditions away from the patient. This includes people  with chronic heart, lung, or kidney conditions, diabetes, and cancer.  Ensure good ventilation Make sure that shared spaces in the home have good air flow, such as from an air conditioner or an opened window, weather permitting.  Wash your hands often  Wash your hands often and thoroughly with soap and water for at least 20 seconds. You can use an alcohol based hand sanitizer if soap and water are not available and if your hands are not visibly dirty.  Avoid touching your eyes, nose, and mouth with unwashed hands.  Use disposable paper towels to dry your hands. If not available, use dedicated cloth towels and replace them when they become wet.  Wear a facemask and gloves  Wear a disposable facemask at all times in the room and gloves when you touch or have contact with the patients blood, body fluids, and/or secretions or excretions, such as sweat, saliva, sputum, nasal mucus, vomit, urine, or feces.  Ensure the mask fits over your nose and mouth tightly, and do not touch it during use.  Throw out disposable facemasks and gloves after using them. Do not reuse.  Wash your hands immediately after removing your facemask and gloves.  If your personal clothing becomes contaminated, carefully remove clothing and launder. Wash your hands after handling contaminated  clothing.  Place all used disposable facemasks, gloves, and other waste in a lined container before disposing them with other household waste.  Remove gloves and wash your hands immediately after handling these items.  Do not share dishes, glasses, or other household items with the patient  Avoid sharing household items. You should not share dishes, drinking glasses, cups, eating utensils, towels, bedding, or other items with a patient who is confirmed to have, or being evaluated for, COVID-19 infection.  After the person uses these items, you should wash them thoroughly with soap and water.  Wash laundry  thoroughly  Immediately remove and wash clothes or bedding that have blood, body fluids, and/or secretions or excretions, such as sweat, saliva, sputum, nasal mucus, vomit, urine, or feces, on them.  Wear gloves when handling laundry from the patient.  Read and follow directions on labels of laundry or clothing items and detergent. In general, wash and dry with the warmest temperatures recommended on the label.  Clean all areas the individual has used often  Clean all touchable surfaces, such as counters, tabletops, doorknobs, bathroom fixtures, toilets, phones, keyboards, tablets, and bedside tables, every day. Also, clean any surfaces that may have blood, body fluids, and/or secretions or excretions on them.  Wear gloves when cleaning surfaces the patient has come in contact with.  Use a diluted bleach solution (e.g., dilute bleach with 1 part bleach and 10 parts water) or a household disinfectant with a label that says EPA-registered for coronaviruses. To make a bleach solution at home, add 1 tablespoon of bleach to 1 quart (4 cups) of water. For a larger supply, add  cup of bleach to 1 gallon (16 cups) of water.  Read labels of cleaning products and follow recommendations provided on product labels. Labels contain instructions for safe and effective use of the cleaning product including precautions you should take when applying the product, such as wearing gloves or eye protection and making sure you have good ventilation during use of the product.  Remove gloves and wash hands immediately after cleaning.  Monitor yourself for signs and symptoms of illness Caregivers and household members are considered close contacts, should monitor their health, and will be asked to limit movement outside of the home to the extent possible. Follow the monitoring steps for close contacts listed on the symptom monitoring form.   ? If you have additional questions, contact your local health department  or call the epidemiologist on call at [phone] (available 24/7). ? This guidance is subject to change. For the most up-to-date guidance from [facility], please refer to their website: [email or web address]

## 2018-12-23 NOTE — Care Management Important Message (Signed)
Important Message  Patient Details  Name: Deborah [name] MRN: [id number] Date of Birth: Mar 31, 1930   Medicare Important Message Given:  Yes - Important Message mailed due to current National Emergency  Verbal consent obtained due to current National Emergency  Relationship to patient: Child Contact Name: Adna Nofziger Call Date: 12/23/18  Time: 1253 Phone: [phone] Outcome: Spoke with contact Important Message mailed to: Other (must enter comment)(emailed to [email or web address])    Romy Mcgue P Angelica Frandsen 12/23/2018, 12:54 PM

## 2018-12-23 NOTE — TOC Transition Note (Signed)
Transition of Care [facility]) - CM/SW Discharge Note   Patient Details  Name: Deborah [name] MRN: [id number] Date of Birth: Jan 12, 1931  [facility] CM/SW Contact:  Weston Anna, LCSW Phone Number: 12/23/2018, 10:48 AM   Clinical Narrative:     Patient set to discharge to [facility] today- CSW notified patients son, Gwyndolyn Saxon, no concerns at this time. Patient will need transportation vis [facility]- facility requested pick up after lunch time.   Final next level of care: Skilled Nursing Facility Barriers to Discharge: No Barriers Identified   Patient Goals and CMS Choice        Discharge Placement              Patient chooses bed at: [facility] Patient to be transferred to facility by: [place] Name of family member notified: Legrand Como- son Patient and family notified of of transfer: 12/23/18  Discharge Plan and Services                DME Arranged: N/A         HH Arranged: NA          Social Determinants of Health (SDOH) Interventions     Readmission Risk Interventions No flowsheet data found.

## 2018-12-23 NOTE — Plan of Care (Signed)
Patient and son Deborah [name] updated on plan of care, all questions answered

## 2018-12-23 NOTE — Progress Notes (Signed)
Report on pt called to [facility] [place] at [facility] [phone].  Pt had a BM today 11/10-used bedpan. Pt has eaten about 50% of her meals. Remains on 1L O2 (90-94%) Remdesivir complete.

## 2018-12-25 DIAGNOSIS — J9601 Acute respiratory failure with hypoxia: Secondary | ICD-10-CM | POA: Diagnosis not present

## 2018-12-25 DIAGNOSIS — E875 Hyperkalemia: Secondary | ICD-10-CM | POA: Diagnosis not present

## 2018-12-25 DIAGNOSIS — N390 Urinary tract infection, site not specified: Secondary | ICD-10-CM | POA: Diagnosis not present

## 2018-12-25 DIAGNOSIS — I5032 Chronic diastolic (congestive) heart failure: Secondary | ICD-10-CM | POA: Diagnosis not present

## 2019-01-02 ENCOUNTER — Ambulatory Visit: Payer: Medicare Other | Admitting: Primary Care

## 2019-01-05 DIAGNOSIS — R110 Nausea: Secondary | ICD-10-CM | POA: Diagnosis not present

## 2019-01-12 DIAGNOSIS — R1084 Generalized abdominal pain: Secondary | ICD-10-CM | POA: Diagnosis not present

## 2019-01-13 ENCOUNTER — Emergency Department: Payer: Medicare Other

## 2019-01-13 ENCOUNTER — Encounter: Payer: Self-pay | Admitting: Emergency Medicine

## 2019-01-13 ENCOUNTER — Inpatient Hospital Stay
Admission: EM | Admit: 2019-01-13 | Discharge: 2019-02-13 | DRG: 871 | Disposition: E | Payer: Medicare Other | Attending: Family Medicine | Admitting: Family Medicine

## 2019-01-13 ENCOUNTER — Other Ambulatory Visit: Payer: Self-pay

## 2019-01-13 DIAGNOSIS — L8990 Pressure ulcer of unspecified site, unspecified stage: Secondary | ICD-10-CM | POA: Insufficient documentation

## 2019-01-13 DIAGNOSIS — N2581 Secondary hyperparathyroidism of renal origin: Secondary | ICD-10-CM | POA: Diagnosis present

## 2019-01-13 DIAGNOSIS — Z6841 Body Mass Index (BMI) 40.0 and over, adult: Secondary | ICD-10-CM

## 2019-01-13 DIAGNOSIS — G4733 Obstructive sleep apnea (adult) (pediatric): Secondary | ICD-10-CM | POA: Diagnosis present

## 2019-01-13 DIAGNOSIS — D126 Benign neoplasm of colon, unspecified: Secondary | ICD-10-CM | POA: Diagnosis present

## 2019-01-13 DIAGNOSIS — I248 Other forms of acute ischemic heart disease: Secondary | ICD-10-CM | POA: Diagnosis present

## 2019-01-13 DIAGNOSIS — Z8701 Personal history of pneumonia (recurrent): Secondary | ICD-10-CM

## 2019-01-13 DIAGNOSIS — Z7984 Long term (current) use of oral hypoglycemic drugs: Secondary | ICD-10-CM

## 2019-01-13 DIAGNOSIS — I4819 Other persistent atrial fibrillation: Secondary | ICD-10-CM | POA: Diagnosis present

## 2019-01-13 DIAGNOSIS — Z87891 Personal history of nicotine dependence: Secondary | ICD-10-CM

## 2019-01-13 DIAGNOSIS — F0280 Dementia in other diseases classified elsewhere without behavioral disturbance: Secondary | ICD-10-CM | POA: Diagnosis present

## 2019-01-13 DIAGNOSIS — Z66 Do not resuscitate: Secondary | ICD-10-CM | POA: Diagnosis present

## 2019-01-13 DIAGNOSIS — I4891 Unspecified atrial fibrillation: Secondary | ICD-10-CM | POA: Diagnosis not present

## 2019-01-13 DIAGNOSIS — Z8619 Personal history of other infectious and parasitic diseases: Secondary | ICD-10-CM | POA: Diagnosis not present

## 2019-01-13 DIAGNOSIS — K259 Gastric ulcer, unspecified as acute or chronic, without hemorrhage or perforation: Secondary | ICD-10-CM | POA: Diagnosis present

## 2019-01-13 DIAGNOSIS — E785 Hyperlipidemia, unspecified: Secondary | ICD-10-CM | POA: Diagnosis present

## 2019-01-13 DIAGNOSIS — K5190 Ulcerative colitis, unspecified, without complications: Secondary | ICD-10-CM | POA: Diagnosis present

## 2019-01-13 DIAGNOSIS — R5381 Other malaise: Secondary | ICD-10-CM | POA: Diagnosis present

## 2019-01-13 DIAGNOSIS — Z820 Family history of epilepsy and other diseases of the nervous system: Secondary | ICD-10-CM

## 2019-01-13 DIAGNOSIS — R531 Weakness: Secondary | ICD-10-CM

## 2019-01-13 DIAGNOSIS — E872 Acidosis: Secondary | ICD-10-CM | POA: Diagnosis present

## 2019-01-13 DIAGNOSIS — I160 Hypertensive urgency: Secondary | ICD-10-CM | POA: Diagnosis present

## 2019-01-13 DIAGNOSIS — N390 Urinary tract infection, site not specified: Secondary | ICD-10-CM | POA: Diagnosis present

## 2019-01-13 DIAGNOSIS — E038 Other specified hypothyroidism: Secondary | ICD-10-CM | POA: Diagnosis present

## 2019-01-13 DIAGNOSIS — F419 Anxiety disorder, unspecified: Secondary | ICD-10-CM | POA: Diagnosis present

## 2019-01-13 DIAGNOSIS — K253 Acute gastric ulcer without hemorrhage or perforation: Secondary | ICD-10-CM | POA: Diagnosis not present

## 2019-01-13 DIAGNOSIS — Z9981 Dependence on supplemental oxygen: Secondary | ICD-10-CM | POA: Diagnosis not present

## 2019-01-13 DIAGNOSIS — R6521 Severe sepsis with septic shock: Secondary | ICD-10-CM | POA: Diagnosis present

## 2019-01-13 DIAGNOSIS — K760 Fatty (change of) liver, not elsewhere classified: Secondary | ICD-10-CM | POA: Diagnosis present

## 2019-01-13 DIAGNOSIS — I959 Hypotension, unspecified: Secondary | ICD-10-CM | POA: Diagnosis present

## 2019-01-13 DIAGNOSIS — D638 Anemia in other chronic diseases classified elsewhere: Secondary | ICD-10-CM | POA: Diagnosis present

## 2019-01-13 DIAGNOSIS — I130 Hypertensive heart and chronic kidney disease with heart failure and stage 1 through stage 4 chronic kidney disease, or unspecified chronic kidney disease: Secondary | ICD-10-CM | POA: Diagnosis present

## 2019-01-13 DIAGNOSIS — E860 Dehydration: Secondary | ICD-10-CM | POA: Diagnosis present

## 2019-01-13 DIAGNOSIS — R633 Feeding difficulties: Secondary | ICD-10-CM | POA: Diagnosis present

## 2019-01-13 DIAGNOSIS — D649 Anemia, unspecified: Secondary | ICD-10-CM | POA: Diagnosis present

## 2019-01-13 DIAGNOSIS — R1110 Vomiting, unspecified: Secondary | ICD-10-CM | POA: Diagnosis present

## 2019-01-13 DIAGNOSIS — G3183 Dementia with Lewy bodies: Secondary | ICD-10-CM | POA: Diagnosis present

## 2019-01-13 DIAGNOSIS — I469 Cardiac arrest, cause unspecified: Secondary | ICD-10-CM | POA: Diagnosis not present

## 2019-01-13 DIAGNOSIS — M1990 Unspecified osteoarthritis, unspecified site: Secondary | ICD-10-CM | POA: Diagnosis present

## 2019-01-13 DIAGNOSIS — R6520 Severe sepsis without septic shock: Secondary | ICD-10-CM | POA: Diagnosis not present

## 2019-01-13 DIAGNOSIS — A419 Sepsis, unspecified organism: Secondary | ICD-10-CM | POA: Diagnosis present

## 2019-01-13 DIAGNOSIS — E1122 Type 2 diabetes mellitus with diabetic chronic kidney disease: Secondary | ICD-10-CM | POA: Diagnosis present

## 2019-01-13 DIAGNOSIS — I5032 Chronic diastolic (congestive) heart failure: Secondary | ICD-10-CM | POA: Diagnosis present

## 2019-01-13 DIAGNOSIS — Z8249 Family history of ischemic heart disease and other diseases of the circulatory system: Secondary | ICD-10-CM

## 2019-01-13 DIAGNOSIS — N1832 Chronic kidney disease, stage 3b: Secondary | ICD-10-CM | POA: Diagnosis present

## 2019-01-13 DIAGNOSIS — N179 Acute kidney failure, unspecified: Secondary | ICD-10-CM | POA: Diagnosis present

## 2019-01-13 DIAGNOSIS — R1011 Right upper quadrant pain: Secondary | ICD-10-CM | POA: Diagnosis not present

## 2019-01-13 DIAGNOSIS — E1142 Type 2 diabetes mellitus with diabetic polyneuropathy: Secondary | ICD-10-CM | POA: Diagnosis present

## 2019-01-13 DIAGNOSIS — Z888 Allergy status to other drugs, medicaments and biological substances status: Secondary | ICD-10-CM

## 2019-01-13 DIAGNOSIS — Z79899 Other long term (current) drug therapy: Secondary | ICD-10-CM

## 2019-01-13 DIAGNOSIS — R32 Unspecified urinary incontinence: Secondary | ICD-10-CM | POA: Diagnosis present

## 2019-01-13 DIAGNOSIS — R000 Tachycardia, unspecified: Secondary | ICD-10-CM | POA: Diagnosis present

## 2019-01-13 DIAGNOSIS — F329 Major depressive disorder, single episode, unspecified: Secondary | ICD-10-CM | POA: Diagnosis present

## 2019-01-13 DIAGNOSIS — K8020 Calculus of gallbladder without cholecystitis without obstruction: Secondary | ICD-10-CM | POA: Diagnosis present

## 2019-01-13 LAB — CBC WITH DIFFERENTIAL/PLATELET
Abs Immature Granulocytes: 0.44 10*3/uL — ABNORMAL HIGH (ref 0.00–0.07)
Basophils Absolute: 0.1 10*3/uL (ref 0.0–0.1)
Basophils Relative: 1 %
Eosinophils Absolute: 0 10*3/uL (ref 0.0–0.5)
Eosinophils Relative: 0 %
HCT: 32.1 % — ABNORMAL LOW (ref 36.0–46.0)
Hemoglobin: 10.1 g/dL — ABNORMAL LOW (ref 12.0–15.0)
Immature Granulocytes: 3 %
Lymphocytes Relative: 18 %
Lymphs Abs: 3.3 10*3/uL (ref 0.7–4.0)
MCH: 30.3 pg (ref 26.0–34.0)
MCHC: 31.5 g/dL (ref 30.0–36.0)
MCV: 96.4 fL (ref 80.0–100.0)
Monocytes Absolute: 1.2 10*3/uL — ABNORMAL HIGH (ref 0.1–1.0)
Monocytes Relative: 7 %
Neutro Abs: 12.8 10*3/uL — ABNORMAL HIGH (ref 1.7–7.7)
Neutrophils Relative %: 71 %
Platelets: 385 10*3/uL (ref 150–400)
RBC: 3.33 MIL/uL — ABNORMAL LOW (ref 3.87–5.11)
RDW: 19 % — ABNORMAL HIGH (ref 11.5–15.5)
WBC: 17.9 10*3/uL — ABNORMAL HIGH (ref 4.0–10.5)
nRBC: 0.3 % — ABNORMAL HIGH (ref 0.0–0.2)

## 2019-01-13 LAB — COMPREHENSIVE METABOLIC PANEL
ALT: 13 U/L (ref 0–44)
AST: 17 U/L (ref 15–41)
Albumin: 2.3 g/dL — ABNORMAL LOW (ref 3.5–5.0)
Alkaline Phosphatase: 62 U/L (ref 38–126)
Anion gap: 16 — ABNORMAL HIGH (ref 5–15)
BUN: 71 mg/dL — ABNORMAL HIGH (ref 8–23)
CO2: 24 mmol/L (ref 22–32)
Calcium: 8.7 mg/dL — ABNORMAL LOW (ref 8.9–10.3)
Chloride: 93 mmol/L — ABNORMAL LOW (ref 98–111)
Creatinine, Ser: 3.2 mg/dL — ABNORMAL HIGH (ref 0.44–1.00)
GFR calc Af Amer: 14 mL/min — ABNORMAL LOW (ref >60)
GFR calc non Af Amer: 12 mL/min — ABNORMAL LOW (ref >60)
Glucose, Bld: 137 mg/dL — ABNORMAL HIGH (ref 70–99)
Potassium: 4.6 mmol/L (ref 3.5–5.1)
Sodium: 133 mmol/L — ABNORMAL LOW (ref 135–145)
Total Bilirubin: 0.8 mg/dL (ref 0.3–1.2)
Total Protein: 5.6 g/dL — ABNORMAL LOW (ref 6.5–8.1)

## 2019-01-13 LAB — URINALYSIS, COMPLETE (UACMP) WITH MICROSCOPIC
Bilirubin Urine: NEGATIVE
Glucose, UA: NEGATIVE mg/dL
Ketones, ur: NEGATIVE mg/dL
Nitrite: NEGATIVE
Protein, ur: 100 mg/dL — AB
RBC / HPF: >50 RBC/hpf — ABNORMAL HIGH (ref 0–5)
Specific Gravity, Urine: 1.019 (ref 1.005–1.030)
WBC, UA: >50 WBC/hpf — ABNORMAL HIGH (ref 0–5)
pH: 5 (ref 5.0–8.0)

## 2019-01-13 LAB — LACTIC ACID, PLASMA
Lactic Acid, Venous: 3.1 mmol/L (ref 0.5–1.9)
Lactic Acid, Venous: 2.8 mmol/L (ref 0.5–1.9)

## 2019-01-13 LAB — TROPONIN I (HIGH SENSITIVITY)
Troponin I (High Sensitivity): 24 ng/L — ABNORMAL HIGH (ref <18)
Troponin I (High Sensitivity): 30 ng/L — ABNORMAL HIGH (ref <18)

## 2019-01-13 LAB — LIPASE, BLOOD: Lipase: 16 U/L (ref 11–51)

## 2019-01-13 MED ORDER — SODIUM CHLORIDE 0.9 % IV SOLN: 1.0000 g | INTRAVENOUS | Status: DC

## 2019-01-13 MED ORDER — SODIUM CHLORIDE 0.9 % IV SOLN
INTRAVENOUS | Status: DC
  Administered 2019-01-13 – 2019-01-14 (×3): via INTRAVENOUS

## 2019-01-13 MED ORDER — SODIUM CHLORIDE 0.9 % IV BOLUS
500.0000 mL | Freq: Once | INTRAVENOUS | Status: AC
  Administered 2019-01-13: 500 mL via INTRAVENOUS

## 2019-01-13 MED ORDER — ESCITALOPRAM OXALATE 10 MG PO TABS
10.0000 mg | ORAL_TABLET | Freq: Every day | ORAL | Status: DC
  Administered 2019-01-14: 10 mg via ORAL
  Filled 2019-01-13 (×2): qty 1

## 2019-01-13 MED ORDER — SIMVASTATIN 20 MG PO TABS: 20.0000 mg | ORAL_TABLET | Freq: Every day | ORAL | Status: DC

## 2019-01-13 MED ORDER — SODIUM CHLORIDE 0.9 % IV SOLN
80.0000 mg | Freq: Once | INTRAVENOUS | Status: AC
  Administered 2019-01-13: 80 mg via INTRAVENOUS
  Filled 2019-01-13: qty 80

## 2019-01-13 MED ORDER — ACETAMINOPHEN 325 MG PO TABS
650.0000 mg | ORAL_TABLET | Freq: Four times a day (QID) | ORAL | Status: DC | PRN
  Filled 2019-01-13: qty 2

## 2019-01-13 MED ORDER — PIPERACILLIN-TAZOBACTAM 3.375 G IVPB
3.3750 g | Freq: Two times a day (BID) | INTRAVENOUS | Status: DC
  Administered 2019-01-13 – 2019-01-14 (×3): 3.375 g via INTRAVENOUS
  Filled 2019-01-13 (×2): qty 50

## 2019-01-13 MED ORDER — SODIUM CHLORIDE 0.9 % IV SOLN
8.0000 mg/h | INTRAVENOUS | Status: DC
  Administered 2019-01-13 – 2019-01-14 (×2): 8 mg/h via INTRAVENOUS
  Filled 2019-01-13 (×2): qty 80

## 2019-01-13 MED ORDER — LORAZEPAM 0.5 MG PO TABS: 0.5000 mg | ORAL_TABLET | Freq: Two times a day (BID) | ORAL | Status: DC | PRN

## 2019-01-13 MED ORDER — BUDESONIDE 3 MG PO CPEP
3.0000 mg | ORAL_CAPSULE | Freq: Every day | ORAL | Status: DC
  Administered 2019-01-14: 3 mg via ORAL
  Filled 2019-01-13 (×2): qty 1

## 2019-01-13 MED ORDER — IPRATROPIUM-ALBUTEROL 0.5-2.5 (3) MG/3ML IN SOLN: 3.0000 mL | Freq: Three times a day (TID) | RESPIRATORY_TRACT | Status: DC | PRN

## 2019-01-13 MED ORDER — ACETAMINOPHEN 650 MG RE SUPP
650.0000 mg | Freq: Four times a day (QID) | RECTAL | Status: DC | PRN
  Administered 2019-01-13 – 2019-01-14 (×2): 650 mg via RECTAL
  Filled 2019-01-13 (×2): qty 1

## 2019-01-13 MED ORDER — SODIUM CHLORIDE 0.9 % IV BOLUS
1000.0000 mL | Freq: Once | INTRAVENOUS | Status: AC
  Administered 2019-01-13: 1000 mL via INTRAVENOUS

## 2019-01-13 MED ORDER — PANTOPRAZOLE SODIUM 40 MG IV SOLR: 40.0000 mg | Freq: Once | INTRAVENOUS | Status: DC

## 2019-01-13 MED ORDER — HEPARIN SODIUM (PORCINE) 5000 UNIT/ML IJ SOLN
5000.0000 [IU] | Freq: Three times a day (TID) | INTRAMUSCULAR | Status: DC
  Administered 2019-01-13 – 2019-01-14 (×4): 5000 [IU] via SUBCUTANEOUS
  Filled 2019-01-13 (×4): qty 1

## 2019-01-13 NOTE — ED Notes (Signed)
PT's last 2 BP's have been low. Notified covering NP. PT is not symptomatic and BP is currently 101/56 (70)

## 2019-01-13 NOTE — H&P (Addendum)
History and Physical:    Deborah [name]   UTM:[id number] DOB: March 24, 1930 DOA: 01/14/2019  Referring MD/provider: Dr. Merlyn Lot PCP: Venia Carbon, MD   Patient coming from: Nursing home  Chief Complaint: Abdominal pain  History of Present Illness:   Deborah [name] is an 85 y.o. female with medical history significant for chronic diastolic CHF, hypertension, type 2 diabetes mellitus, dementia, debility, morbid obesity, anxiety, CKD stage III, recent discharge (on 12/23/2018) from [facility] after hospitalization for COVID-19 pneumonia, recent Klebsiella UTI.  She presented from the nursing home because of 1 week history of abdominal pain.  Patient is in pain and she is unable to provide an adequate history.  History was mainly obtained from her husband at the bedside.  Apparently, patient has had intermittent abdominal pain for about a week now.  She has also had poor oral intake and has not been eating or drinking anything.  She has had significant nausea and has been spitting up a lot but no actual vomiting.  She also reports a 2 to 3-day history of watery diarrhea but lately she does not have much stools because there is nothing in her stomach.  No reported fever or chills.  No shortness of breath, chest pain, dizziness..  ED Course:  The patient was found to be hypotensive and tachycardic in the emergency room.  Urinalysis is suggestive of UTI she has leukocytosis and acute kidney injury.  CT scan abdomen and pelvis showed inflammatory changes with areas of air suggestive of gastric ulceration without perforation.  Abdominal ultrasound showed fatty liver and cholelithiasis without cholecystitis.  Lactic acid was elevated at 3.1    ROS:   ROS limited information because of dementia and some confusion   Past Medical History:   Past Medical History:  Diagnosis Date  . 'light-for-dates' infant with signs of fetal malnutrition   . Anxiety   . Chronic kidney  disease, stage III (moderate)   . Contracture of palmar fascia   . Cough   . Diabetes mellitus 2019   all diabetic meds stopped upon arrival to [facility]  . Hyperlipidemia   . Hypertension   . Lower extremity edema   . Obesity   . Osteoarthritis   . Other specified acquired hypothyroidism   . Otitis externa   . Palpitations   . Peripheral neuropathy   . Pure hypercholesterolemia   . Secondary hyperparathyroidism (of renal origin)   . Sleep apnea   . Ulcerative colitis ([place])   . Unspecified adjustment reaction   . Unspecified hereditary and idiopathic peripheral neuropathy   . Unspecified urinary incontinence     Past Surgical History:   Past Surgical History:  Procedure Laterality Date  . BACK SURGERY  1990   Ruptured Disc.  Marland Kitchen CARDIAC CATHETERIZATION  03/2008   due to chest pain  . COLONOSCOPY    . COLONOSCOPY WITH PROPOFOL N/A 10/02/2016   Procedure: COLONOSCOPY WITH PROPOFOL;  Surgeon: Lucilla Lame, MD;  Location[facility] ENDOSCOPY;  Service: Endoscopy;  Laterality: N/A;  . DUPUYTREN CONTRACTURE RELEASE Left 05/27/2017   Procedure: DUPUYTREN CONTRACTURE RELEASE-LEFT INDEX AND PINKY FINGER;  Surgeon: Leanor Kail, MD;  Location: [facility] ORS;  Service: Orthopedics;  Laterality: Left;  . EYE SURGERY Bilateral 2014   cataract extractions with IOL implant  . HAND SURGERY Right 1999   dupeytren release  . REPLACEMENT TOTAL KNEE Bilateral 2004, 1999  . SHOULDER SURGERY Right 06/2007   rotator cuff repair    Social  History:   Social History   Socioeconomic History  . Marital status: Married    Spouse name: Not on file  . Number of children: 2  . Years of education: Not on file  . Highest education level: Not on file  Occupational History  . 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   Social Needs  . Financial resource strain: Not on file  . Food insecurity    Worry: Not on file    Inability: Not on file  .  Transportation needs    Medical: Not on file    Non-medical: Not on file  Tobacco Use  . Smoking status: Former Smoker    Packs/day: 1.00    Years: 10.00    Pack years: 10.00    Types: Cigarettes    Quit date: 12/27/1997    Years since quitting: 21.0  . Smokeless tobacco: Never Used  Substance and Sexual Activity  . Alcohol use: No  . Drug use: No  . Sexual activity: Not Currently  Lifestyle  . Physical activity    Days per week: Not on file    Minutes per session: Not on file  . Stress: Not on file  Relationships  . Social Herbalist on phone: Not on file    Gets together: Not on file    Attends religious service: Not on file    Active member of club or organization: Not on file    Attends meetings of clubs or organizations: Not on file    Relationship status: Not on file  . Intimate partner violence    Fear of current or ex partner: Not on file    Emotionally abused: Not on file    Physically abused: Not on file    Forced sexual activity: Not on file  Other Topics Concern  . Not on file  Social History Narrative  . Not on file    Allergies   Ace inhibitors  Family history:   Family History  Problem Relation Age of Onset  . Heart attack Father   . Hypertension Sister   . Hypertension Sister   . Alzheimer's disease Mother     Current Medications:   Prior to Admission medications   Medication Sig Start Date End Date Taking? Authorizing Provider  acetaminophen (TYLENOL) 500 MG tablet Take 500 mg by mouth every 6 (six) hours as needed for mild pain.     [provider]  budesonide (ENTOCORT EC) 3 MG 24 hr capsule Take 3 mg by mouth daily.     [provider]  carvedilol (COREG) 3.125 MG tablet Take 1 tablet (3.125 mg total) by mouth 2 (two) times daily. 02/21/18 02/21/19  Vaughan Basta, MD  cholestyramine Lucrezia Starch) 4 GM/DOSE powder Take 4 g by mouth as needed.     [provider]  Cranberry 425 MG CAPS Take 425 mg  by mouth daily at 6 (six) AM.    [provider]  escitalopram (LEXAPRO) 10 MG tablet Take 10 mg by mouth daily.    [provider]  glipiZIDE (GLUCOTROL) 5 MG tablet Take 2.5 mg by mouth 2 (two) times daily before a meal.  08/29/18   [provider]  guaifenesin (ROBITUSSIN) 100 MG/5ML syrup Take 200 mg by mouth every 4 (four) hours as needed for cough.     [provider]  ipratropium-albuterol (DUONEB) 0.5-2.5 (3) MG/3ML SOLN Take 3 mLs by nebulization every  8 (eight) hours as needed (shortness of breath).    [provider]  loperamide (IMODIUM A-D) 2 MG tablet Take 1 tablet (2 mg total) by mouth 4 (four) times daily as needed for diarrhea or loose stools. 10/16/16   Lucilla Lame, MD  loratadine (CLARITIN) 10 MG tablet Take 10 mg by mouth every evening.    [provider]  LORazepam (ATIVAN) 0.5 MG tablet Take 1 tablet (0.5 mg total) by mouth every 12 (twelve) hours as needed for anxiety. 12/23/18   Thurnell Lose, MD  nystatin (MYCOSTATIN/NYSTOP) powder Apply topically under pannus and underbilateral breast twice daily as needed for yeast rash    [provider]  oxybutynin (DITROPAN-XL) 10 MG 24 hr tablet Take 1 tablet (10 mg total) by mouth daily. 10/27/18   Bjorn Loser, MD  Probiotic Product (ALIGN) 4 MG CAPS Take 1 capsule by mouth daily.    [provider]  simvastatin (ZOCOR) 20 MG tablet Take 20 mg by mouth at bedtime.  07/18/15   Margarita Rana, MD  spironolactone (ALDACTONE) 25 MG tablet Take 25 mg by mouth daily. 11/19/18   [provider]  torsemide (DEMADEX) 20 MG tablet Take 40 mg by mouth daily.     [provider]  triamcinolone lotion (KENALOG) 0.1 % Apply 1 application topically 3 (three) times daily. Apply to Bilateral Calves topically every 8 hours as needed for stasis-dermatitis    [provider]  trimethoprim (TRIMPEX) 100 MG tablet Take 100 mg by mouth daily.    [provider]    Physical Exam:   Vitals:   01/24/2019 1608 01/18/2019 1615 01/19/2019 1630 01/24/2019 1810  BP:  (!) 92/47 (!) 108/57   Pulse: 87 87 94   Resp: 18 20 20    Temp:      TempSrc:      SpO2: 98% 96% 97%   Weight:    136.1 kg     Physical Exam: Blood pressure (!) 108/57, pulse 94, temperature 97.6 F (36.4 C), temperature source Oral, resp. rate 20, weight 136.1 kg, SpO2 97 %. Gen: No acute distress. Head: Normocephalic, atraumatic. Eyes: Pupils equal, round and reactive to light. Extraocular movements intact.  Sclerae nonicteric. No lid lag. Mouth: No lesions seen in the mouth. Edentulous Neck: Supple, no thyromegaly, no lymphadenopathy, no jugular venous distention. Chest: Lungs are clear to auscultation with good air movement. No rales, rhonchi or wheezes.  CV: Heart sounds are regular with an S1, S2. No murmurs, rubs, or gallops.  Abdomen: Soft, epigastric tenderness and periumbilical tenderness, no rebound tenderness or guarding, obese with normal active bowel sounds. No palpable masses. Extremities: Extremities are without clubbing, or cyanosis. Trace pedal edema bilaterally. Pedal pulses 2+.  Skin: Warm and dry. No rashes, lesions or wounds. Neuro: Alert and oriented times 3; grossly nonfocal.  Psych: No behavioral issues at this time   Data Review:    Labs: Basic Metabolic Panel: Recent Labs  Lab 02/09/2019 1430  NA 133*  K 4.6  CL 93*  CO2 24  GLUCOSE 137*  BUN 71*  CREATININE 3.20*  CALCIUM 8.7*   Liver Function Tests: Recent Labs  Lab 01/25/2019 1430  AST 17  ALT 13  ALKPHOS 62  BILITOT 0.8  PROT 5.6*  ALBUMIN 2.3*   Recent Labs  Lab 01/16/2019 1641  LIPASE 16   No results for input(s): AMMONIA in the last 168 hours. CBC: Recent Labs  Lab 01/20/2019 1430  WBC 17.9*  NEUTROABS 12.8*  HGB 10.1*  HCT 32.1*  MCV 96.4  PLT 385   Cardiac Enzymes: No results for input(s): CKTOTAL, CKMB, CKMBINDEX, TROPONINI in the last 168 hours.  BNP  (last 3 results) No results for input(s): PROBNP in the last 8760 hours. CBG: No results for input(s): GLUCAP in the last 168 hours.  Urinalysis    Component Value Date/Time   COLORURINE AMBER (A) 01/17/2019 1648   APPEARANCEUR TURBID (A) 02/04/2019 1648   APPEARANCEUR Clear 07/14/2018 1342   LABSPEC 1.019 01/18/2019 1648   PHURINE 5.0 02/09/2019 1648   GLUCOSEU NEGATIVE 01/20/2019 1648   HGBUR SMALL (A) 02/12/2019 1648   BILIRUBINUR NEGATIVE 01/20/2019 1648   BILIRUBINUR Negative 07/14/2018 1342   KETONESUR NEGATIVE 02/09/2019 1648   PROTEINUR 100 (A) 01/20/2019 1648   UROBILINOGEN 0.2 12/31/2014 1720   NITRITE NEGATIVE 01/21/2019 1648   LEUKOCYTESUR MODERATE (A) 01/24/2019 1648      Radiographic Studies: Ct Abdomen Pelvis Wo Contrast  Result Date: 01/23/2019 CLINICAL DATA:  Abdominal pain and lethargy with COVID-19 positivity EXAM: CT ABDOMEN AND PELVIS WITHOUT CONTRAST TECHNIQUE: Multidetector CT imaging of the abdomen and pelvis was performed following the standard protocol without IV contrast. COMPARISON:  None. FINDINGS: Lower chest: Patchy airspace opacities are noted in the bases consistent with the patient's given clinical history. Hepatobiliary: Liver is well visualized and within normal limits. The gallbladder is well distended with multiple dependent gallstones. Mild pericholecystic inflammatory changes are noted which may be related to acute cholecystitis. This may also be compensatory secondary to the adjacent changes in the distal stomach/proximal duodenum. Pancreas: Fatty interdigitation of the pancreas is noted. Spleen: Spleen is within normal limits. Adrenals/Urinary Tract: Adrenal glands are unremarkable. Kidneys are well visualized bilaterally. Few small renal calculi are noted bilaterally without obstructive change. The ureters are within normal limits. Cystic lesions are noted bilaterally but incompletely evaluated on this exam due to the lack of IV contrast. The  bladder is decompressed. Stomach/Bowel: Mild retained fecal material is noted within the colon without obstructive change. The appendix is within normal limits. Visualized small bowel is unremarkable. In the distal stomach there is perigastric inflammatory change and irregular air which extends to the margin of the distal gastric wall suggestive of ulceration. No definitive changes of perforation are noted at this time. Some inflammatory changes in the first portion of the duodenum are noted as well. Vascular/Lymphatic: Aortic atherosclerosis. No enlarged abdominal or pelvic lymph nodes. Reproductive: Uterus and bilateral adnexa are unremarkable. Other: No abdominal wall hernia or abnormality. No abdominopelvic ascites. Musculoskeletal: No acute or significant osseous findings. IMPRESSION: There are changes in the distal stomach/proximal duodenum with inflammatory change and areas of air which extend to the margin of the distal gastric wall both anteriorly and posteriorly. These are suspicious for areas of gastric ulceration without definitive perforation. Dilatation of the gallbladder with dependent gallstones and some mild Peri cholecystic inflammatory change. This may be compensatory from the adjacent duodenum however the possibility of acute cholecystitis could not be totally excluded. Ultrasound may be helpful for further evaluation as clinically indicated. Airspace opacities in the bases bilaterally consistent with the given clinical history of COVID-19. Aortic Atherosclerosis (ICD10-I70.0). Electronically Signed   By: Inez Catalina M.D.   On: 01/17/2019 15:51   US Abdomen Limited Ruq  Result Date: 02/05/2019 CLINICAL DATA:  85 year old female with right upper quadrant abdominal pain. EXAM: ULTRASOUND ABDOMEN LIMITED RIGHT UPPER QUADRANT COMPARISON:  CT of the abdomen pelvis dated 01/22/2019. FINDINGS: Evaluation is limited due to patient's  body habitus. Gallbladder: Small stones noted in the gallbladder.  There is no gallbladder wall thickening or pericholecystic fluid. Negative sonographic Murphy's sign. Common bile duct: Diameter: 2 mm Liver: There is diffuse increased liver echogenicity most commonly seen in the setting of fatty infiltration. Superimposed inflammation or fibrosis is not excluded. Clinical correlation is recommended. Portal vein is patent on color Doppler imaging with normal direction of blood flow towards the liver. Other: None. IMPRESSION: 1. Cholelithiasis without sonographic evidence of acute cholecystitis. 2. Fatty liver. Electronically Signed   By: Anner Crete M.D.   On: 02/10/2019 16:51    EKG: Independently reviewed.  EKG showed A. fib   Assessment/Plan:   Principal Problem:   Sepsis ([place]) Active Problems:   AKI (acute kidney injury) ([place])   Acute lower UTI   Hypotension    Body mass index is 54.87 kg/m.   Sepsis secondary to UTI: Admit to telemetry.  Treat with empiric IV Zosyn and IV fluids.  (She was treated with IV Rocephin for Klebsiella UTI within the last month) follow-up urine and blood cultures.  Repeat lactic acid.  Hypotension: Patient received 500 mils bolus in the ED.  I have ordered additional 1 L bolus of normal saline.  This will be followed by maintenance fluids with normal saline.  Acute kidney injury on CKD stage III b: Treat with IV fluids.  Monitor BMP.  Gastric ulcer on CT scan: General surgeon, Dr. Christian Mate has already been consulted by ED physician, Dr. Quentin Cornwall.  Chronic diastolic CHF: Hold carvedilol, torsemide and Aldactone because of AKI and hypotension  ?  Atrial fibrillation on EKG: Repeat EKG tomorrow.  Third troponin: This is likely due to demand ischemia from sepsis.   Other information:   DVT prophylaxis: Heparin Code Status: DNR Family Communication: Plan of care discussed with her husband at the bedside Disposition Plan: To be determined Consults called: General surgeon, Dr. Christian Mate Admission status:  Inpatient  The medical decision making on this patient was of high complexity and the patient is at high risk for clinical deterioration, therefore this is a level 3 visit.    Nichollas Perusse [facility]   How to contact the [facility] Attending or Consulting provider [place] or covering provider during after hours [place], for this patient?   1. Check the care team in [facility] and look for a) attending/consulting [facility] provider listed and b) the [facility] team listed 2. Log into [email or web address] and use [place]'s universal password to access. If you do not have the password, please contact the hospital operator. 3. Locate the [facility] provider you are looking for under [facility] and page to a number that you can be directly reached. 4. If you still have difficulty reaching the provider, please page the [facility] (Director on Call) for the Hospitalists listed on amion for assistance.  02/09/2019, 6:15 PM

## 2019-01-13 NOTE — Consult Note (Signed)
Pharmacy Antibiotic Note  Deborah [name] is a 85 y.o. female admitted on 01/20/2019 with sepsis.  Pharmacy has been consulted for pip/tazo dosing.  Plan: Zosyn 3.375g IV q12h (4 hour infusion). CrCl < 20 ml/min.   Weight: 300 lb (136.1 kg)  Temp (24hrs), Avg:97.6 F (36.4 C), Min:97.6 F (36.4 C), Max:97.6 F (36.4 C)  Recent Labs  Lab 01/20/2019 1430 01/14/2019 1641  WBC 17.9*  --   CREATININE 3.20*  --   LATICACIDVEN  --  3.1*    Estimated Creatinine Clearance: 16.8 mL/min (A) (by C-G formula based on SCr of 3.2 mg/dL (H)).    Allergies  Allergen Reactions  . Ace Inhibitors Cough    Antimicrobials this admission: 12/1 pip/tazo >>   Dose adjustments this admission: None  Microbiology results: 12/1 BCx: pending 11/4 COVID: positive.   Thank you for allowing pharmacy to be a part of this patient's care.  Oswald Hillock, PharmD, BCPS 02/09/2019 6:12 PM

## 2019-01-13 NOTE — ED Notes (Signed)
Family at bedside. 

## 2019-01-13 NOTE — ED Notes (Signed)
Date and time results received: 02/10/2019 1740 (use smartphrase ".now" to insert current time)  Test: lactic Critical Value: 3.1  Name of Provider Notified: Jennye Boroughs

## 2019-01-13 NOTE — ED Provider Notes (Signed)
Ultrasound does not show any evidence of acute cholecystitis.  Will start IV Protonix due to concern for gastric and duodenal ulcers.  No clear evidence of perforation but given her tenderness I did discuss in consultation with general surgery.  Is there is no evidence of perforation at this time will admit to medicine.  She remains hemodynamically stable.   Merlyn Lot, MD 02/04/2019 [phone]

## 2019-01-13 NOTE — ED Notes (Signed)
US at bedside

## 2019-01-13 NOTE — ED Notes (Signed)
Pt is bed bound at baseline

## 2019-01-13 NOTE — ED Notes (Signed)
NP Ouma made aware of low BP. States she will come see pt and evaluate.

## 2019-01-13 NOTE — ED Notes (Signed)
PT has been moved to hospital bed

## 2019-01-13 NOTE — ED Notes (Signed)
Pt cleaned, new chux applied, pt changed into gown. Pt's sacrum appears intact.

## 2019-01-13 NOTE — ED Provider Notes (Signed)
[facility] Emergency Department Provider Note       Time seen: ----------------------------------------- 2:30 PM on 01/22/2019 ----------------------------------------- Level V caveat: History/ROS limited by dementia  I have reviewed the triage vital signs and the nursing notes.  HISTORY   Chief Complaint Fatigue    HPI Deborah [name] is a 85 y.o. female with a history of chronic kidney disease, diabetes, hypertension, hyperlipidemia, osteoarthritis, ulcerative colitis who presents to the ED for abdominal pain and lethargy.  Reportedly she was Covid positive and hospitalized about a month ago.  She is on chronic 2 L nasal cannula oxygen.  She denies any specific pain at this time.  Past Medical History:  Diagnosis Date  . 'light-for-dates' infant with signs of fetal malnutrition   . Anxiety   . Chronic kidney disease, stage III (moderate)   . Contracture of palmar fascia   . Cough   . Diabetes mellitus 2019   all diabetic meds stopped upon arrival to [facility]  . Hyperlipidemia   . Hypertension   . Lower extremity edema   . Obesity   . Osteoarthritis   . Other specified acquired hypothyroidism   . Otitis externa   . Palpitations   . Peripheral neuropathy   . Pure hypercholesterolemia   . Secondary hyperparathyroidism (of renal origin)   . Sleep apnea   . Ulcerative colitis ([place])   . Unspecified adjustment reaction   . Unspecified hereditary and idiopathic peripheral neuropathy   . Unspecified urinary incontinence     Patient Active Problem List   Diagnosis Date Noted  . Pneumonia due to COVID-19 virus 12/17/2018  . AKI (acute kidney injury) ([place]) 12/17/2018  . Pneumonia 02/19/2018  . Benign neoplasm of cecum   . Benign neoplasm of transverse colon   . Lewy body dementia without behavioral disturbance ([place])   . Palliative care by specialist   . DNR (do not resuscitate)   . Goals of care, counseling/discussion   . Sepsis ([place])  02/24/2016  . Urinary tract infection without hematuria 02/24/2016  . Memory loss 07/18/2015  . Depression 01/12/2015  . Shoulder pain 12/17/2014  . Pain in shoulder 08/05/2014  . Gonalgia 07/21/2014  . History of knee surgery 07/21/2014  . Allergic rhinitis 07/16/2014  . Anxiety 07/16/2014  . Benign hypertension 07/16/2014  . Alteration in bowel elimination: incontinence 07/16/2014  . Chronic kidney disease (CKD), stage III (moderate) 07/16/2014  . Colitis 07/16/2014  . CN (constipation) 07/16/2014  . Diarrhea 07/16/2014  . Contracture of palmar fascia (Dupuytren's) 07/16/2014  . Hypercholesteremia 07/16/2014  . Hypoxia 07/16/2014  . Absence of bladder continence 07/16/2014  . Focal lymphocytic colitis 07/16/2014  . Extreme obesity 07/16/2014  . Arthritis, degenerative 07/16/2014  . Disorder of peripheral nervous system 07/16/2014  . Neuralgia neuritis, sciatic nerve 07/16/2014  . Hyperparathyroidism, secondary ([place]) 07/16/2014  . Apnea, sleep 07/16/2014  . Subclinical hypothyroidism 07/16/2014  . Controlled type 2 diabetes mellitus ([place]) 07/16/2014  . 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 Urinary incontinence without sensory awareness 09/03/2012  . Palpitations 12/28/2010  . SOB (shortness of breath) 12/28/2010  . Overweight 08/02/2009    Past Surgical History:  Procedure Laterality Date  . BACK SURGERY  1990   Ruptured Disc.  Marland Kitchen CARDIAC CATHETERIZATION  03/2008   due to chest pain  . COLONOSCOPY    . COLONOSCOPY WITH PROPOFOL N/A 10/02/2016   Procedure: COLONOSCOPY WITH PROPOFOL;  Surgeon: Lucilla Lame, MD;  Location: [facility] ENDOSCOPY;  Service: Endoscopy;  Laterality: N/A;  .  DUPUYTREN CONTRACTURE RELEASE Left 05/27/2017   Procedure: DUPUYTREN CONTRACTURE RELEASE-LEFT INDEX AND PINKY FINGER;  Surgeon: Leanor Kail, MD;  Location: [facility] ORS;  Service: Orthopedics;  Laterality: Left;  . EYE SURGERY Bilateral 2014   cataract extractions with IOL implant  . HAND SURGERY  Right 1999   dupeytren release  . REPLACEMENT TOTAL KNEE Bilateral 2004, 1999  . SHOULDER SURGERY Right 06/2007   rotator cuff repair    Allergies Ace inhibitors  Social History Social History   Tobacco Use  . Smoking status: Former Smoker    Packs/day: 1.00    Years: 10.00    Pack years: 10.00    Types: Cigarettes    Quit date: 12/27/1997    Years since quitting: 21.0  . Smokeless tobacco: Never Used  Substance Use Topics  . Alcohol use: No  . Drug use: No    Review of Systems Unknown, patient is demented and confused  ____________________________________________   PHYSICAL EXAM:  VITAL SIGNS: ED Triage Vitals  Enc Vitals Group     BP 01/24/2019 1424 (!) 135/56     Pulse Rate 02/11/2019 1424 97     Resp 01/28/2019 1424 18     Temp 01/22/2019 1424 97.6 F (36.4 C)     Temp Source 02/04/2019 1424 Oral     SpO2 02/07/2019 1424 99 %     Weight --      Height --      Head Circumference --      Peak Flow --      Pain Score 02/07/2019 1422 0     Pain Loc --      Pain Edu? --      Excl. in [place]? --     Constitutional: Morbidly obese, mild distress, seems uncomfortable Eyes: Conjunctivae are normal. Normal extraocular movements. ENT      Head: Normocephalic and atraumatic.      Nose: No congestion/rhinnorhea.      Mouth/Throat: Mucous membranes are moist.      Neck: No stridor. Cardiovascular: Normal rate, regular rhythm. No murmurs, rubs, or gallops. Respiratory: Normal respiratory effort without tachypnea nor retractions. Breath sounds are clear and equal bilaterally. No wheezes/rales/rhonchi. Gastrointestinal: Soft with nonfocal tenderness.  Normal bowel sounds. Musculoskeletal: Limited range of motion of the extremities Neurologic:  Normal speech and language. No gross focal neurologic deficits are appreciated.  Skin:  Skin is warm, dry and intact. No rash noted. Psychiatric: Depressed mood and affect ____________________________________________  EKG: Interpreted by  me.  Atrial fibrillation with a rate of 99 bpm, possible septal infarct age-indeterminate, normal axis, normal QT  ____________________________________________  ED COURSE:  As part of my medical decision making, I reviewed the following data within the [place] History obtained from family if available, nursing notes, old chart and ekg, as well as notes from prior ED visits. Patient presented for weakness, we will assess with labs and imaging as indicated at this time.   Procedures  Deborah [name] was evaluated in Emergency Department on 02/06/2019 for the symptoms described in the history of present illness. She was evaluated in the context of the global COVID-19 pandemic, which necessitated consideration that the patient might be at risk for infection with the SARS-CoV-2 virus that causes COVID-19. Institutional protocols and algorithms that pertain to the evaluation of patients at risk for COVID-19 are in a state of rapid change based on information released by regulatory bodies including the CDC and federal and state organizations. These policies and  algorithms were followed during the patient's care in the ED.  ____________________________________________   LABS (pertinent positives/negatives)  Labs Reviewed  CBC WITH DIFFERENTIAL/PLATELET - Abnormal; Notable for the following components:      Result Value   WBC 17.9 (*)    RBC 3.33 (*)    Hemoglobin 10.1 (*)    HCT 32.1 (*)    RDW 19.0 (*)    nRBC 0.3 (*)    Neutro Abs 12.8 (*)    Monocytes Absolute 1.2 (*)    Abs Immature Granulocytes 0.44 (*)    All other components within normal limits  COMPREHENSIVE METABOLIC PANEL - Abnormal; Notable for the following components:   Sodium 133 (*)    Chloride 93 (*)    Glucose, Bld 137 (*)    BUN 71 (*)    Creatinine, Ser 3.20 (*)    Calcium 8.7 (*)    Total Protein 5.6 (*)    Albumin 2.3 (*)    GFR calc non Af Amer 12 (*)    GFR calc Af Amer 14 (*)    Anion gap 16  (*)    All other components within normal limits  TROPONIN I (HIGH SENSITIVITY) - Abnormal; Notable for the following components:   Troponin I (High Sensitivity) 24 (*)    All other components within normal limits  CULTURE, BLOOD (ROUTINE X 2)  CULTURE, BLOOD (ROUTINE X 2)  URINALYSIS, COMPLETE (UACMP) WITH MICROSCOPIC  LACTIC ACID, PLASMA    RADIOLOGY  CT of the abdomen pelvis without contrast is pending  ____________________________________________   DIFFERENTIAL DIAGNOSIS   Sepsis, dehydration, electrolyte abnormality, MI, CVA  FINAL ASSESSMENT AND PLAN  Weakness, abdominal pain   Plan: The patient had presented for weakness and abdominal pain.  Husband states that she has been having abdominal pain for the past week.  Patient's labs did reveal leukocytosis and acute kidney injury likely from dehydration. Patient's imaging is still pending at this time.  I have ordered a CT scan without contrast because of her kidney function.  Anticipate need for admission.   Deborah Aly, MD    Note: This note was generated in part or whole with voice recognition software. Voice recognition is usually quite accurate but there are transcription errors that can and very often do occur. I apologize for any typographical errors that were not detected and corrected.     Deborah Newport, MD 02/03/2019 1520

## 2019-01-13 NOTE — ED Triage Notes (Signed)
Pt from [facility], c/o abd pain and lethargy. PT + covid x19monthago. PT is at baseline, on 2L chronic o2. Pt A&Ox3, appears fatigue. MD WJimmye Normanat bedside

## 2019-01-14 ENCOUNTER — Inpatient Hospital Stay
Admit: 2019-01-14 | Discharge: 2019-01-14 | Disposition: A | Discharge status: Home / Self Care | Payer: Medicare Other | Attending: Nurse Practitioner | Admitting: Nurse Practitioner

## 2019-01-14 DIAGNOSIS — R1011 Right upper quadrant pain: Secondary | ICD-10-CM

## 2019-01-14 DIAGNOSIS — I4891 Unspecified atrial fibrillation: Secondary | ICD-10-CM

## 2019-01-14 DIAGNOSIS — L8990 Pressure ulcer of unspecified site, unspecified stage: Secondary | ICD-10-CM | POA: Insufficient documentation

## 2019-01-14 LAB — CBC
HCT: 27.2 % — ABNORMAL LOW (ref 36.0–46.0)
Hemoglobin: 8.6 g/dL — ABNORMAL LOW (ref 12.0–15.0)
MCH: 30.4 pg (ref 26.0–34.0)
MCHC: 31.6 g/dL (ref 30.0–36.0)
MCV: 96.1 fL (ref 80.0–100.0)
Platelets: 293 10*3/uL (ref 150–400)
RBC: 2.83 MIL/uL — ABNORMAL LOW (ref 3.87–5.11)
RDW: 18.9 % — ABNORMAL HIGH (ref 11.5–15.5)
WBC: 13.8 10*3/uL — ABNORMAL HIGH (ref 4.0–10.5)
nRBC: 0.1 % (ref 0.0–0.2)

## 2019-01-14 LAB — ECHOCARDIOGRAM COMPLETE
Height: 68 in
Weight: 4820.8 oz

## 2019-01-14 LAB — BASIC METABOLIC PANEL
Anion gap: 14 (ref 5–15)
BUN: 84 mg/dL — ABNORMAL HIGH (ref 8–23)
CO2: 23 mmol/L (ref 22–32)
Calcium: 8.1 mg/dL — ABNORMAL LOW (ref 8.9–10.3)
Chloride: 98 mmol/L (ref 98–111)
Creatinine, Ser: 3.29 mg/dL — ABNORMAL HIGH (ref 0.44–1.00)
GFR calc Af Amer: 14 mL/min — ABNORMAL LOW (ref >60)
GFR calc non Af Amer: 12 mL/min — ABNORMAL LOW (ref >60)
Glucose, Bld: 125 mg/dL — ABNORMAL HIGH (ref 70–99)
Potassium: 3.9 mmol/L (ref 3.5–5.1)
Sodium: 135 mmol/L (ref 135–145)

## 2019-01-14 LAB — FOLATE: Folate: 9.9 ng/mL (ref >5.9)

## 2019-01-14 LAB — IRON AND TIBC
Iron: 42 ug/dL (ref 28–170)
Saturation Ratios: 28 % (ref 10.4–31.8)
TIBC: 151 ug/dL — ABNORMAL LOW (ref 250–450)
UIBC: 109 ug/dL

## 2019-01-14 LAB — FERRITIN: Ferritin: 163 ng/mL (ref 11–307)

## 2019-01-14 LAB — MAGNESIUM: Magnesium: 2.3 mg/dL (ref 1.7–2.4)

## 2019-01-14 LAB — TSH: TSH: 4.999 u[IU]/mL — ABNORMAL HIGH (ref 0.350–4.500)

## 2019-01-14 LAB — VITAMIN B12: Vitamin B-12: 431 pg/mL (ref 180–914)

## 2019-01-14 LAB — BRAIN NATRIURETIC PEPTIDE: B Natriuretic Peptide: 78 pg/mL (ref 0.0–100.0)

## 2019-01-14 MED ORDER — SUCRALFATE 1 GM/10ML PO SUSP
1.0000 g | Freq: Three times a day (TID) | ORAL | Status: DC
  Filled 2019-01-14 (×5): qty 10

## 2019-01-14 MED ORDER — HYDROMORPHONE HCL 1 MG/ML IJ SOLN
0.5000 mg | INTRAMUSCULAR | Status: DC | PRN
  Administered 2019-01-14 – 2019-01-15 (×3): 0.5 mg via INTRAVENOUS
  Filled 2019-01-14 (×3): qty 1

## 2019-01-14 MED ORDER — METOPROLOL TARTRATE 5 MG/5ML IV SOLN
5.0000 mg | Freq: Once | INTRAVENOUS | Status: AC
  Administered 2019-01-14: 5 mg via INTRAVENOUS

## 2019-01-14 MED ORDER — PANTOPRAZOLE SODIUM 40 MG IV SOLR
40.0000 mg | Freq: Two times a day (BID) | INTRAVENOUS | Status: DC
  Administered 2019-01-14 (×2): 40 mg via INTRAVENOUS
  Filled 2019-01-14 (×2): qty 40

## 2019-01-14 MED ORDER — METOPROLOL TARTRATE 5 MG/5ML IV SOLN
INTRAVENOUS | Status: AC
  Filled 2019-01-14: qty 5

## 2019-01-14 NOTE — Consult Note (Addendum)
[facility] SURGICAL CONSULTATION NOTE (initial) - cpt: [phone]   HISTORY OF PRESENT ILLNESS (HPI):  History is limited secondary to dementia. History primarily obtained through chart review and discussion with medical team.   85 y.o. female presented to [facility] ED 12/01 for evaluation of abdominal pain. Per the patient's husband, she had been endorsing abdominal pain over the last week. Pain described to be intermittent in nature. Her husband states she has had decreased PO intake and had been "spitting up" more frequently. No additional obtained complaints aside from generalized weakness. She was worked up in the ED which revealed mild leukocytosis to 17K, UTI, lactic acidosis (3.1), and acute on chronic kidney injury. She did undergo CT scan which was concerning for inflammatory changes and gas in stomach/duodenum concerning for gastric or duodenal ulceration without evidence of perforation. She was additionally found to have cholelithiasis but follow up RUQ [place] did not suggest cholecysitits. She was admitted to the medicine service for ABx, resuscitation, and medical management for sepsis presumably from UTI.   Surgery was consulted by emergency medicine physician Dr. Merlyn Lot in this context for evaluation and management of gastric/duodenal ulceration without perforation.   PAST MEDICAL HISTORY (PMH):  Past Medical History:  Diagnosis Date  . 'light-for-dates' infant with signs of fetal malnutrition   . Anxiety   . Chronic kidney disease, stage III (moderate)   . Contracture of palmar fascia   . Cough   . Diabetes mellitus 2019   all diabetic meds stopped upon arrival to [facility]  . Hyperlipidemia   . Hypertension   . Lower extremity edema   . Obesity   . Osteoarthritis   . Other specified acquired hypothyroidism   . Otitis externa   . Palpitations   . Peripheral neuropathy   . Pure hypercholesterolemia   . Secondary hyperparathyroidism (of renal origin)   .  Sleep apnea   . Ulcerative colitis ([place])   . Unspecified adjustment reaction   . Unspecified hereditary and idiopathic peripheral neuropathy   . Unspecified urinary incontinence      PAST SURGICAL HISTORY ([place]):  Past Surgical History:  Procedure Laterality Date  . BACK SURGERY  1990   Ruptured Disc.  Marland Kitchen CARDIAC CATHETERIZATION  03/2008   due to chest pain  . COLONOSCOPY    . COLONOSCOPY WITH PROPOFOL N/A 10/02/2016   Procedure: COLONOSCOPY WITH PROPOFOL;  Surgeon: Lucilla Lame, MD;  Location[facility] ENDOSCOPY;  Service: Endoscopy;  Laterality: N/A;  . DUPUYTREN CONTRACTURE RELEASE Left 05/27/2017   Procedure: DUPUYTREN CONTRACTURE RELEASE-LEFT INDEX AND PINKY FINGER;  Surgeon: Leanor Kail, MD;  Location: [facility] ORS;  Service: Orthopedics;  Laterality: Left;  . EYE SURGERY Bilateral 2014   cataract extractions with IOL implant  . HAND SURGERY Right 1999   dupeytren release  . REPLACEMENT TOTAL KNEE Bilateral 2004, 1999  . SHOULDER SURGERY Right 06/2007   rotator cuff repair     MEDICATIONS:  Prior to Admission medications   Medication Sig Start Date End Date Taking? Authorizing Provider  acetaminophen (TYLENOL) 500 MG tablet Take 500 mg by mouth every 6 (six) hours as needed for mild pain.    Yes [provider]  benzocaine (ORAJEL) 10 % mucosal gel Use as directed 1 application in the mouth or throat every 6 (six) hours as needed (cold sores).   Yes [provider]  carvedilol (COREG) 3.125 MG tablet Take 1 tablet (3.125 mg total) by mouth 2 (two) times daily. 02/21/18 02/21/19 Yes Vaughan Basta,  MD  Cranberry 425 MG CAPS Take 425 mg by mouth daily.    Yes [provider]  escitalopram (LEXAPRO) 10 MG tablet Take 5 mg by mouth daily.    Yes [provider]  glipiZIDE (GLUCOTROL) 5 MG tablet Take 2.5 mg by mouth 2 (two) times daily before a meal.  08/29/18  Yes [provider]  guaifenesin (ROBITUSSIN) 100 MG/5ML syrup Take 200 mg by  mouth every 4 (four) hours as needed for cough.    Yes [provider]  HYDROcodone-acetaminophen (NORCO/VICODIN) 5-325 MG tablet Take 1-2 tablets by mouth every 6 (six) hours as needed for moderate pain or severe pain.   Yes [provider]  ipratropium-albuterol (DUONEB) 0.5-2.5 (3) MG/3ML SOLN Take 3 mLs by nebulization every 8 (eight) hours as needed (shortness of breath).   Yes [provider]  lidocaine (XYLOCAINE) 2 % solution Use as directed 15 mLs in the mouth or throat every 6 (six) hours as needed for mouth pain.   Yes [provider]  loratadine (CLARITIN) 10 MG tablet Take 10 mg by mouth every evening.   Yes [provider]  nystatin (MYCOSTATIN/NYSTOP) powder Apply topically under pannus and underbilateral breast twice daily as needed for yeast rash   Yes [provider]  omeprazole (PRILOSEC) 20 MG capsule Take 20 mg by mouth daily.   Yes [provider]  ondansetron (ZOFRAN) 4 MG tablet Take 4 mg by mouth every 8 (eight) hours as needed for nausea or vomiting.   Yes [provider]  torsemide (DEMADEX) 20 MG tablet Take 40 mg by mouth daily as needed (increased edema).    Yes [provider]  triamcinolone lotion (KENALOG) 0.1 % Apply 1 application topically 3 (three) times daily as needed (stasis dermatitis). (apply to bilateral calves)   Yes [provider]  zinc oxide 20 % ointment Apply 1 application topically as needed for irritation. (apply to buttocks)   Yes [provider]     ALLERGIES:  Allergies  Allergen Reactions  . Ace Inhibitors Cough     SOCIAL HISTORY:  Social History   Socioeconomic History  . Marital status: Married    Spouse name: Not on file  . Number of children: 2  . Years of education: Not on file  . Highest education level: Not on file  Occupational History  . 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    Social Needs  . Financial resource strain: Not on file  . Food insecurity    Worry: Not on file    Inability: Not on file  . Transportation needs    Medical: Not on file    Non-medical: Not on file  Tobacco Use  . Smoking status: Former Smoker    Packs/day: 1.00    Years: 10.00    Pack years: 10.00    Types: Cigarettes    Quit date: 12/27/1997    Years since quitting: 21.0  . Smokeless tobacco: Never Used  Substance and Sexual Activity  . Alcohol use: No  . Drug use: No  . Sexual activity: Not Currently  Lifestyle  . Physical activity    Days per week: Not on file    Minutes per session: Not on file  . Stress: Not on file  Relationships  . Social Herbalist on phone: Not on file    Gets together: Not on file    Attends religious  service: Not on file    Active member of club or organization: Not on file    Attends meetings of clubs or organizations: Not on file    Relationship status: Not on file  . Intimate partner violence    Fear of current or ex partner: Not on file    Emotionally abused: Not on file    Physically abused: Not on file    Forced sexual activity: Not on file  Other Topics Concern  . Not on file  Social History Narrative  . Not on file     FAMILY HISTORY:  Family History  Problem Relation Age of Onset  . Heart attack Father   . Hypertension Sister   . Hypertension Sister   . Alzheimer's disease Mother       REVIEW OF SYSTEMS:  Review of Systems  Unable to perform ROS: Dementia  Gastrointestinal: Positive for abdominal pain.  All other systems reviewed and are negative.   VITAL SIGNS:  Temp:  [97.6 F (36.4 C)-98.3 F (36.8 C)] 98.1 F (36.7 C) (12/02 0321) Pulse Rate:  [82-98] 86 (12/02 0321) Resp:  [14-26] 19 (12/02 0500) BP: (84-135)/(36-102) 101/51 (12/02 0321) SpO2:  [95 %-100 %] 99 % (12/02 0321) Weight:  [136.1 kg-136.7 kg] 136.7 kg (12/02 0321)     Height: 5' 8"  (172.7 cm) Weight: (!) 136.7 kg BMI (Calculated):  45.82   INTAKE/OUTPUT:  12/01 0701 - 12/02 0700 In: 1600 [IV Piggyback:1600] Out: -   PHYSICAL EXAM:  Physical Exam Vitals signs and nursing note reviewed.  Constitutional:      General: She is not in acute distress.    Appearance: Normal appearance. She is obese. She is not ill-appearing.     Interventions: Nasal cannula in place.  HENT:     Head: Normocephalic and atraumatic.  Eyes:     General: No scleral icterus.    Conjunctiva/sclera: Conjunctivae normal.  Cardiovascular:     Rate and Rhythm: Normal rate. Rhythm irregularly irregular.     Pulses:          Radial pulses are 2+ on the right side and 2+ on the left side.  Pulmonary:     Effort: Pulmonary effort is normal. No respiratory distress.     Breath sounds: Normal breath sounds. No wheezing or rhonchi.  Abdominal:     General: There is no distension.     Palpations: Abdomen is soft.     Tenderness: There is abdominal tenderness in the epigastric area. There is no guarding or rebound. Negative signs include Murphy's sign.     Comments: Mental status makes assessment challenging, she winces with palpation to the epigastric region, no RUQ tenderness, Negative Murphy's sign, no rebound/guarding, no peritonitis, no previous scars  Genitourinary:    Comments: Deferred Musculoskeletal:     Right lower leg: Edema present.     Left lower leg: Edema present.     Comments: + 2 pitting edema bilateral lower extremities  Neurological:     General: No focal deficit present.     Mental Status: She is alert. She is disoriented.     Comments: Oriented to person and year only  Psychiatric:        Mood and Affect: Mood normal.        Behavior: Behavior normal.      Labs:  CBC Latest Ref Rng & Units 01/14/2019 01/20/2019 12/22/2018  WBC 4.0 - 10.5 K/uL 13.8(H) 17.9(H) 6.7  Hemoglobin 12.0 - 15.0 g/dL 8.6(L) 10.1(L)  12.8  Hematocrit 36.0 - 46.0 % 27.2(L) 32.1(L) 41.2  Platelets 150 - 400 K/uL 293 385 92(L)   CMP Latest Ref  Rng & Units 01/14/2019 01/30/2019 12/23/2018  Glucose 70 - 99 mg/dL 125(H) 137(H) 239(H)  BUN 8 - 23 mg/dL 84(H) 71(H) 48(H)  Creatinine 0.44 - 1.00 mg/dL 3.29(H) 3.20(H) 1.56(H)  Sodium 135 - 145 mmol/L 135 133(L) 141  Potassium 3.5 - 5.1 mmol/L 3.9 4.6 4.3  Chloride 98 - 111 mmol/L 98 93(L) 104  CO2 22 - 32 mmol/L 23 24 22   Calcium 8.9 - 10.3 mg/dL 8.1(L) 8.7(L) 8.1(L)  Total Protein 6.5 - 8.1 g/dL - 5.6(L) -  Total Bilirubin 0.3 - 1.2 mg/dL - 0.8 -  Alkaline Phos 38 - 126 U/L - 62 -  AST 15 - 41 U/L - 17 -  ALT 0 - 44 U/L - 13 -    Imaging studies:   CT Abdomen/Pelvis (02/08/2019) personally reviewed which does show cholelithiasis as well as air/inflammation in the air ot the distal stomach vs duodenum however there is no evidence of perforation/pneumoperitoneum, and radiologist report reviewed below:  IMPRESSION: There are changes in the distal stomach/proximal duodenum with inflammatory change and areas of air which extend to the margin of the distal gastric wall both anteriorly and posteriorly. These are suspicious for areas of gastric ulceration without definitive perforation.  Dilatation of the gallbladder with dependent gallstones and some mild Peri cholecystic inflammatory change. This may be compensatory from the adjacent duodenum however the possibility of acute cholecystitis could not be totally excluded. Ultrasound may be helpful for further evaluation as clinically indicated.  Airspace opacities in the bases bilaterally consistent with the given clinical history of COVID-19.  Aortic Atherosclerosis (ICD10-I70.0).    RUQ [place] (02/05/2019) personally reviewed showing cholelithiasis without cholecystitis, and radiologist report reviewed below:  IMPRESSION: 1. Cholelithiasis without sonographic evidence of acute cholecystitis. 2. Fatty liver.    Assessment/Plan: (ICD-10's: R10.11) 85 y.o. female with improved abdominal pain from presentation with concern for  gastric/duodenal ulceration without perforation and she was also found to have cholelithiasis without evidence of cholecystitis and negative Murphy's Sign, complicated by pertinent comorbidities including dementia, CHF with pEF, acute on chronic kidney disease, T2DM, and morbid obesity   - Okay for diet as tolerates  - Continue PPI; consider GI consult for potential endoscopic evaluation +/- work up & treatment to rule out H pylori  - Continue IVF resuscitation  - IV ABx for UTI per medicine; follow up urine Cx  - No indication for emergent surgical intervention currently  - Further management per primary/consulting services    All of the above findings and recommendations were discussed with the patient, and the medical team.   Thank you for the opportunity to participate in this patient's care.   -- Edison Simon, PA-C [facility] 01/14/2019, 7:36 AM [phone] M-F: 7am - 4pm

## 2019-01-14 NOTE — Plan of Care (Signed)

## 2019-01-14 NOTE — ED Notes (Signed)
There is some question regarding appropriate placement on this floor due to several B/P's w/ systolic value less than 90. Pt's MAP has remained above 60 for past several hours.

## 2019-01-14 NOTE — Progress Notes (Addendum)
Brief Hx:  85 y.o. female with medical history significant for chronic diastolic CHF, hypertension, type 2 diabetes mellitus, dementia, debility, morbid obesity, anxiety, CKD stage III, recent discharge (on 12/23/2018) from [facility] after hospitalization for COVID-19 pneumonia, recent Klebsiella UTI.  She presented from the nursing home because of 1 week history of abdominal pain.  Patient is in pain and she is unable to provide an adequate history.  History was mainly obtained from her husband at the bedside.  Apparently, patient has had intermittent abdominal pain for about a week now.  She has also had poor oral intake and has not been eating or drinking anything.  She has had significant nausea and has been spitting up a lot but no actual vomiting.  She also reports a 2 to 3-day history of watery diarrhea but lately she does not have much stools because there is nothing in her stomach.  No reported fever or chills.  No shortness of breath, chest pain, dizziness.. ED Course:  The patient was found to be hypotensive and tachycardic in the emergency room.  Urinalysis is suggestive of UTI she has leukocytosis and acute kidney injury.  CT scan abdomen and pelvis showed inflammatory changes with areas of air suggestive of gastric ulceration without perforation.  Abdominal ultrasound showed fatty liver and cholelithiasis without cholecystitis.  Lactic acid was elevated at 3.1  Subjective: Still having some abdominal pain.  Did on clear liquid diet.  Denies any nausea vomiting.  Objective: Vital signs in last 24 hours: Temp:  [97.6 F (36.4 C)-98.3 F (36.8 C)] 97.7 F (36.5 C) (12/02 0758) Pulse Rate:  [82-98] 89 (12/02 0758) Resp:  [14-26] 19 (12/02 0758) BP: (84-135)/(36-102) 119/54 (12/02 0758) SpO2:  [95 %-100 %] 98 % (12/02 0758) Weight:  [136.1 kg-136.7 kg] 136.7 kg (12/02 0321)  Intake/Output from previous day: 12/01 0701 - 12/02 0700 In: 1600  Out: -  Intake/Output this  shift: No intake/output data recorded.  PEx:  Gen: Was sleepy this morning.  Alert but not quite oriented to place. Head: Normocephalic, atraumatic. Eyes: Pupils equal, round and reactive to light. Extraocular movements intact.  Sclerae nonicteric. No lid lag. Mouth: No lesions seen in the mouth. Edentulous Neck: Supple, no thyromegaly, no lymphadenopathy, no jugular venous distention. Chest: Lungs are clear to auscultation with good air movement. No rales, rhonchi or wheezes.  CV: Heart sounds are regular with an S1, S2. No murmurs, rubs, or gallops.  Abdomen: Soft, epigastric tenderness and periumbilical tenderness, no rebound tenderness or guarding, obese with normal active bowel sounds. No palpable masses. Extremities: Extremities are without clubbing, or cyanosis. Trace pedal edema bilaterally. Pedal pulses 2+.  Skin: Warm and dry. No rashes, lesions or wounds. Neuro: Alert.  Grossly intact strength and sensation. Psych: No behavioral issues at this time  Results for orders placed or performed during the hospital encounter of 02/07/2019 (from the past 24 hour(s))  CBC with Differential     Status: Abnormal   Collection Time: 02/10/2019  2:30 PM  Result Value Ref Range   WBC 17.9 (H) 4.0 - 10.5 K/uL   RBC 3.33 (L) 3.87 - 5.11 MIL/uL   Hemoglobin 10.1 (L) 12.0 - 15.0 g/dL   HCT 32.1 (L) 36.0 - 46.0 %   MCV 96.4 80.0 - 100.0 fL   MCH 30.3 26.0 - 34.0 pg   MCHC 31.5 30.0 - 36.0 g/dL   RDW 19.0 (H) 11.5 - 15.5 %   Platelets 385 150 - 400 K/uL  nRBC 0.3 (H) 0.0 - 0.2 %   Neutrophils Relative % 71 %   Neutro Abs 12.8 (H) 1.7 - 7.7 K/uL   Lymphocytes Relative 18 %   Lymphs Abs 3.3 0.7 - 4.0 K/uL   Monocytes Relative 7 %   Monocytes Absolute 1.2 (H) 0.1 - 1.0 K/uL   Eosinophils Relative 0 %   Eosinophils Absolute 0.0 0.0 - 0.5 K/uL   Basophils Relative 1 %   Basophils Absolute 0.1 0.0 - 0.1 K/uL   Immature Granulocytes 3 %   Abs Immature Granulocytes 0.44 (H) 0.00 - 0.07 K/uL   Comprehensive metabolic panel     Status: Abnormal   Collection Time: 02/09/2019  2:30 PM  Result Value Ref Range   Sodium 133 (L) 135 - 145 mmol/L   Potassium 4.6 3.5 - 5.1 mmol/L   Chloride 93 (L) 98 - 111 mmol/L   CO2 24 22 - 32 mmol/L   Glucose, Bld 137 (H) 70 - 99 mg/dL   BUN 71 (H) 8 - 23 mg/dL   Creatinine, Ser 3.20 (H) 0.44 - 1.00 mg/dL   Calcium 8.7 (L) 8.9 - 10.3 mg/dL   Total Protein 5.6 (L) 6.5 - 8.1 g/dL   Albumin 2.3 (L) 3.5 - 5.0 g/dL   AST 17 15 - 41 U/L   ALT 13 0 - 44 U/L   Alkaline Phosphatase 62 38 - 126 U/L   Total Bilirubin 0.8 0.3 - 1.2 mg/dL   GFR calc non Af Amer 12 (L) >60 mL/min   GFR calc Af Amer 14 (L) >60 mL/min   Anion gap 16 (H) 5 - 15  Troponin I (High Sensitivity)     Status: Abnormal   Collection Time: 01/14/2019  2:30 PM  Result Value Ref Range   Troponin I (High Sensitivity) 24 (H) <18 ng/L  Lactic acid, plasma     Status: Abnormal   Collection Time: 01/26/2019  4:41 PM  Result Value Ref Range   Lactic Acid, Venous 3.1 (HH) 0.5 - 1.9 mmol/L  Lipase, blood     Status: None   Collection Time: 01/20/2019  4:41 PM  Result Value Ref Range   Lipase 16 11 - 51 U/L  Troponin I (High Sensitivity)     Status: Abnormal   Collection Time: 01/16/2019  4:41 PM  Result Value Ref Range   Troponin I (High Sensitivity) 30 (H) <18 ng/L  Urinalysis, Complete w Microscopic     Status: Abnormal   Collection Time: 01/16/2019  4:48 PM  Result Value Ref Range   Color, Urine AMBER (A) YELLOW   APPearance TURBID (A) CLEAR   Specific Gravity, Urine 1.019 1.005 - 1.030   pH 5.0 5.0 - 8.0   Glucose, UA NEGATIVE NEGATIVE mg/dL   Hgb urine dipstick SMALL (A) NEGATIVE   Bilirubin Urine NEGATIVE NEGATIVE   Ketones, ur NEGATIVE NEGATIVE mg/dL   Protein, ur 100 (A) NEGATIVE mg/dL   Nitrite NEGATIVE NEGATIVE   Leukocytes,Ua MODERATE (A) NEGATIVE   RBC / HPF >50 (H) 0 - 5 RBC/hpf   WBC, UA >50 (H) 0 - 5 WBC/hpf   Bacteria, UA RARE (A) NONE SEEN   Squamous Epithelial / LPF  6-10 0 - 5   WBC Clumps PRESENT   Lactic acid, plasma     Status: Abnormal   Collection Time: 01/31/2019  8:32 PM  Result Value Ref Range   Lactic Acid, Venous 2.8 (HH) 0.5 - 1.9 mmol/L  Basic metabolic panel  Status: Abnormal   Collection Time: 01/14/19  4:49 AM  Result Value Ref Range   Sodium 135 135 - 145 mmol/L   Potassium 3.9 3.5 - 5.1 mmol/L   Chloride 98 98 - 111 mmol/L   CO2 23 22 - 32 mmol/L   Glucose, Bld 125 (H) 70 - 99 mg/dL   BUN 84 (H) 8 - 23 mg/dL   Creatinine, Ser 3.29 (H) 0.44 - 1.00 mg/dL   Calcium 8.1 (L) 8.9 - 10.3 mg/dL   GFR calc non Af Amer 12 (L) >60 mL/min   GFR calc Af Amer 14 (L) >60 mL/min   Anion gap 14 5 - 15  CBC     Status: Abnormal   Collection Time: 01/14/19  4:49 AM  Result Value Ref Range   WBC 13.8 (H) 4.0 - 10.5 K/uL   RBC 2.83 (L) 3.87 - 5.11 MIL/uL   Hemoglobin 8.6 (L) 12.0 - 15.0 g/dL   HCT 27.2 (L) 36.0 - 46.0 %   MCV 96.1 80.0 - 100.0 fL   MCH 30.4 26.0 - 34.0 pg   MCHC 31.6 30.0 - 36.0 g/dL   RDW 18.9 (H) 11.5 - 15.5 %   Platelets 293 150 - 400 K/uL   nRBC 0.1 0.0 - 0.2 %  Brain natriuretic peptide     Status: None   Collection Time: 01/14/19  4:49 AM  Result Value Ref Range   B Natriuretic Peptide 78.0 0.0 - 100.0 pg/mL  Magnesium     Status: None   Collection Time: 01/14/19  4:49 AM  Result Value Ref Range   Magnesium 2.3 1.7 - 2.4 mg/dL  TSH     Status: Abnormal   Collection Time: 01/14/19  4:49 AM  Result Value Ref Range   TSH 4.999 (H) 0.350 - 4.500 uIU/mL    Studies/Results: Ct Abdomen Pelvis Wo Contrast  Result Date: 02/10/2019 CLINICAL DATA:  Abdominal pain and lethargy with COVID-19 positivity EXAM: CT ABDOMEN AND PELVIS WITHOUT CONTRAST TECHNIQUE: Multidetector CT imaging of the abdomen and pelvis was performed following the standard protocol without IV contrast. COMPARISON:  None. FINDINGS: Lower chest: Patchy airspace opacities are noted in the bases consistent with the patient's given clinical history.  Hepatobiliary: Liver is well visualized and within normal limits. The gallbladder is well distended with multiple dependent gallstones. Mild pericholecystic inflammatory changes are noted which may be related to acute cholecystitis. This may also be compensatory secondary to the adjacent changes in the distal stomach/proximal duodenum. Pancreas: Fatty interdigitation of the pancreas is noted. Spleen: Spleen is within normal limits. Adrenals/Urinary Tract: Adrenal glands are unremarkable. Kidneys are well visualized bilaterally. Few small renal calculi are noted bilaterally without obstructive change. The ureters are within normal limits. Cystic lesions are noted bilaterally but incompletely evaluated on this exam due to the lack of IV contrast. The bladder is decompressed. Stomach/Bowel: Mild retained fecal material is noted within the colon without obstructive change. The appendix is within normal limits. Visualized small bowel is unremarkable. In the distal stomach there is perigastric inflammatory change and irregular air which extends to the margin of the distal gastric wall suggestive of ulceration. No definitive changes of perforation are noted at this time. Some inflammatory changes in the first portion of the duodenum are noted as well. Vascular/Lymphatic: Aortic atherosclerosis. No enlarged abdominal or pelvic lymph nodes. Reproductive: Uterus and bilateral adnexa are unremarkable. Other: No abdominal wall hernia or abnormality. No abdominopelvic ascites. Musculoskeletal: No acute or significant osseous findings. IMPRESSION: There  are changes in the distal stomach/proximal duodenum with inflammatory change and areas of air which extend to the margin of the distal gastric wall both anteriorly and posteriorly. These are suspicious for areas of gastric ulceration without definitive perforation. Dilatation of the gallbladder with dependent gallstones and some mild Peri cholecystic inflammatory change. This may  be compensatory from the adjacent duodenum however the possibility of acute cholecystitis could not be totally excluded. Ultrasound may be helpful for further evaluation as clinically indicated. Airspace opacities in the bases bilaterally consistent with the given clinical history of COVID-19. Aortic Atherosclerosis (ICD10-I70.0). Electronically Signed   By: Inez Catalina M.D.   On: 02/01/2019 15:51   Dg Chest Port 1 View  Result Date: 12/17/2018 CLINICAL DATA:  Shortness of breath, hypoxia. EXAM: PORTABLE CHEST 1 VIEW COMPARISON:  11/30/2018 FINDINGS: Patient slightly rotated to the left. Lungs are hypoinflated and demonstrate bilateral patchy mixed interstitial airspace density. No definite effusion. Mild stable cardiomegaly. IMPRESSION: Hypoinflation with mild bilateral patchy mixed interstitial airspace density which may be due to infection. Electronically Signed   By: Marin Olp M.D.   On: 12/17/2018 08:54   US Abdomen Limited Ruq  Result Date: 02/07/2019 CLINICAL DATA:  85 year old female with right upper quadrant abdominal pain. EXAM: ULTRASOUND ABDOMEN LIMITED RIGHT UPPER QUADRANT COMPARISON:  CT of the abdomen pelvis dated 01/29/2019. FINDINGS: Evaluation is limited due to patient's body habitus. Gallbladder: Small stones noted in the gallbladder. There is no gallbladder wall thickening or pericholecystic fluid. Negative sonographic Murphy's sign. Common bile duct: Diameter: 2 mm Liver: There is diffuse increased liver echogenicity most commonly seen in the setting of fatty infiltration. Superimposed inflammation or fibrosis is not excluded. Clinical correlation is recommended. Portal vein is patent on color Doppler imaging with normal direction of blood flow towards the liver. Other: None. IMPRESSION: 1. Cholelithiasis without sonographic evidence of acute cholecystitis. 2. Fatty liver. Electronically Signed   By: Anner Crete M.D.   On: 01/16/2019 16:51    Scheduled Meds: . budesonide  3  mg Oral Daily  . escitalopram  10 mg Oral Daily  . heparin  5,000 Units Subcutaneous Q8H  . pantoprazole (PROTONIX) IV  40 mg Intravenous Q12H  . simvastatin  20 mg Oral QHS  . sucralfate  1 g Oral TID WC & HS   Continuous Infusions: . sodium chloride 100 mL/hr at 01/14/19 1243  . piperacillin-tazobactam (ZOSYN)  IV 3.375 g (01/14/19 0943)   PRN Meds:acetaminophen **OR** acetaminophen, ipratropium-albuterol, LORazepam  Assessment/Plan:  Sepsis and UTI: On admission - Secondary to UTI  - Started empiric IV Zosyn and IV fluids.  (She was treated with IV Rocephin for Klebsiella UTI within the last month)  - follow-up urine and blood cultures -  Repeat lactic acid   Hypotension: On admission  - Stabilizing now  - Patient received 1.5L  - Cont maintenance fluids with normal saline  Acute kidney injury on CKD stage III b:  - Treat with IV fluids.  Monitor BMP. - May consult Nephrology if necessary   Gastric ulcer on CT scan:  - Was on Protonix dripp from admission; changed to iv BID for now  General surgeon, Dr. Christian Mate has already been consulted by ED physician, Dr. Quentin Cornwall; No surgical intervention at this time. Appreciate Surgery recs  -Started on sucralfate GI consulted; will follow the rec  Chronic diastolic CHF: No acute issue at this time - hold carvedilol, torsemide and Aldactone because of AKI and hypotension -Zoom point is stable -Follow echocardiogram  ?  Atrial fibrillation on EKG: In the ER her EKG at the time of admission found to be in A. Fib -Rate controlled this morning -Echocardiogram done report pending -Cardiology consulted.  Deferred anticoagulation at this time. -Recommended to restart Coreg at the time of discharge and to "consider extended holter monitoring in the outpatient setting to evaluate for ongoing A-fib outside of acute illness." - No indication for further rate control at this time - continue carvedilol at time of discharge. Outpatient  follow up with Dr. Ubaldo Glassing or Doristine Mango, PA-C in ~1 week following discharge.  -She had cardiology recommendation  Third troponin: This is likely due to demand ischemia from sepsis. -No further work-up recommended per cardiology  Anemia: Likely combination of anemia of chronic diseases macrocytic? -Hemoglobin this morning found to be 8.6 -Check stool occult blood --P -May check iron panel vitamin B12 and folate -We will follow GI rec as well   LOS: 1 day   Tannor Pyon Izetta Dakin

## 2019-01-14 NOTE — Consult Note (Addendum)
[facility] Cardiology  CARDIOLOGY CONSULT NOTE  Patient ID: Deborah [name] MRN: [id number] DOB/AGE: 1930-06-01 85 y.o.  Admit date: 02/03/2019 Referring Physician - Dr. Izetta Dakin Primary Physician - Dr. Silvio Pate Primary Cardiologist - Dr. Ubaldo Glassing Reason for Consultation - New Onset A-fib  HPI:  Patient provides limited history - most of the history gathered from review of prior documentation  Deborah [name] is a 85 y.o. with a past medical history of heart failure with preserved ejection fraction, hypertension, type 2 diabetes mellitus, dementia, CKD stage III, and recent discharge (on 12/23/2018) from [facility] after hospitalization for COVID-19 pneumonia, who presented to the ED yesterday for one week of abdominal pain. Cardiology consulted given findings of new onset atrial fibrillation seen on EKG and telemetry since admission.   In the ER, she was found to have UTI. She was started on broad spectrum antibiotics. CT abdomen suggested that she may have gastric ulcerations. [place] without evidence of cholecystitis. Lab work showed mildly elevated troponin to 24 > 30. BNP was normal at 78. CXR had mild bilateral patchy densities consist with recent Covid19 infection.   Patient denies having any chest pain, shortness of breath, or palpitations. She does admit to being more tired in the last week that usual for her. Husband had also reported that she seemed more somnolent than usual.    Review of systems complete and found to be negative unless listed above   Past Medical History:  Diagnosis Date  . 'light-for-dates' infant with signs of fetal malnutrition   . Anxiety   . Chronic kidney disease, stage III (moderate)   . Contracture of palmar fascia   . Cough   . Diabetes mellitus 2019   all diabetic meds stopped upon arrival to [facility]  . Hyperlipidemia   . Hypertension   . Lower extremity edema   . Obesity   . Osteoarthritis   . Other specified acquired hypothyroidism   . Otitis  externa   . Palpitations   . Peripheral neuropathy   . Pure hypercholesterolemia   . Secondary hyperparathyroidism (of renal origin)   . Sleep apnea   . Ulcerative colitis ([place])   . Unspecified adjustment reaction   . Unspecified hereditary and idiopathic peripheral neuropathy   . Unspecified urinary incontinence     Past Surgical History:  Procedure Laterality Date  . BACK SURGERY  1990   Ruptured Disc.  Marland Kitchen CARDIAC CATHETERIZATION  03/2008   due to chest pain  . COLONOSCOPY    . COLONOSCOPY WITH PROPOFOL N/A 10/02/2016   Procedure: COLONOSCOPY WITH PROPOFOL;  Surgeon: Lucilla Lame, MD;  Location: [facility] ENDOSCOPY;  Service: Endoscopy;  Laterality: N/A;  . DUPUYTREN CONTRACTURE RELEASE Left 05/27/2017   Procedure: DUPUYTREN CONTRACTURE RELEASE-LEFT INDEX AND PINKY FINGER;  Surgeon: Leanor Kail, MD;  Location: [facility] ORS;  Service: Orthopedics;  Laterality: Left;  . EYE SURGERY Bilateral 2014   cataract extractions with IOL implant  . HAND SURGERY Right 1999   dupeytren release  . REPLACEMENT TOTAL KNEE Bilateral 2004, 1999  . SHOULDER SURGERY Right 06/2007   rotator cuff repair    Medications Prior to Admission  Medication Sig Dispense Refill Last Dose  . acetaminophen (TYLENOL) 500 MG tablet Take 500 mg by mouth every 6 (six) hours as needed for mild pain.    Unknown at PRN  . benzocaine (ORAJEL) 10 % mucosal gel Use as directed 1 application in the mouth or throat every 6 (six) hours as needed (cold sores).  Unknown at PRN  . carvedilol (COREG) 3.125 MG tablet Take 1 tablet (3.125 mg total) by mouth 2 (two) times daily. 60 tablet 11 01/29/2019 at 0800  . Cranberry 425 MG CAPS Take 425 mg by mouth daily.    01/14/2019 at 0800  . escitalopram (LEXAPRO) 10 MG tablet Take 5 mg by mouth daily.    01/28/2019 at 0800  . glipiZIDE (GLUCOTROL) 5 MG tablet Take 2.5 mg by mouth 2 (two) times daily before a meal.    01/19/2019 at 0800  . guaifenesin (ROBITUSSIN) 100 MG/5ML syrup Take 200 mg by  mouth every 4 (four) hours as needed for cough.    Unknown at PRN  . HYDROcodone-acetaminophen (NORCO/VICODIN) 5-325 MG tablet Take 1-2 tablets by mouth every 6 (six) hours as needed for moderate pain or severe pain.   Unknown at PRN  . ipratropium-albuterol (DUONEB) 0.5-2.5 (3) MG/3ML SOLN Take 3 mLs by nebulization every 8 (eight) hours as needed (shortness of breath).   Unknown at PRN  . lidocaine (XYLOCAINE) 2 % solution Use as directed 15 mLs in the mouth or throat every 6 (six) hours as needed for mouth pain.   Unknown at PRN  . loratadine (CLARITIN) 10 MG tablet Take 10 mg by mouth every evening.   01/12/2019 at 2000  . nystatin (MYCOSTATIN/NYSTOP) powder Apply topically under pannus and underbilateral breast twice daily as needed for yeast rash   Unknown at PRN  . omeprazole (PRILOSEC) 20 MG capsule Take 20 mg by mouth daily.   01/16/2019 at 0800  . ondansetron (ZOFRAN) 4 MG tablet Take 4 mg by mouth every 8 (eight) hours as needed for nausea or vomiting.   Unknown at PRN  . torsemide (DEMADEX) 20 MG tablet Take 40 mg by mouth daily as needed (increased edema).    Unknown at PRN  . triamcinolone lotion (KENALOG) 0.1 % Apply 1 application topically 3 (three) times daily as needed (stasis dermatitis). (apply to bilateral calves)   Unknown at PRN  . zinc oxide 20 % ointment Apply 1 application topically as needed for irritation. (apply to buttocks)   Unknown at PRN   Social History   Socioeconomic History  . Marital status: Married    Spouse name: Not on file  . Number of children: 2  . Years of education: Not on file  . Highest education level: Not on file  Occupational History  . 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   Social Needs  . Financial resource strain: Not on file  . Food insecurity    Worry: Not on file    Inability: Not on file  . Transportation needs    Medical: Not on file    Non-medical: Not on file  Tobacco Use   . Smoking status: Former Smoker    Packs/day: 1.00    Years: 10.00    Pack years: 10.00    Types: Cigarettes    Quit date: 12/27/1997    Years since quitting: 21.0  . Smokeless tobacco: Never Used  Substance and Sexual Activity  . Alcohol use: No  . Drug use: No  . Sexual activity: Not Currently  Lifestyle  . Physical activity    Days per week: Not on file    Minutes per session: Not on file  . Stress: Not on file  Relationships  . Social Herbalist on phone: Not on file    Gets together: Not  on file    Attends religious service: Not on file    Active member of club or organization: Not on file    Attends meetings of clubs or organizations: Not on file    Relationship status: Not on file  . Intimate partner violence    Fear of current or ex partner: Not on file    Emotionally abused: Not on file    Physically abused: Not on file    Forced sexual activity: Not on file  Other Topics Concern  . Not on file  Social History Narrative  . Not on file    Family History  Problem Relation Age of Onset  . Heart attack Father   . Hypertension Sister   . Hypertension Sister   . Alzheimer's disease Mother     Review of systems complete and found to be negative unless listed above    PHYSICAL EXAM  General: Obese. Well developed, well nourished, in no acute distress HEENT:  Normocephalic and atramatic Neck:  No JVD.  Lungs: Clear bilaterally to auscultation and percussion. Heart: irregular irregular rhythm, regular rate . Normal S1 and S2 without gallops or murmurs.  Extremities: No clubbing, cyanosis or edema.   Neuro: Alert and oriented X 3. Psych:  Good affect, responds appropriately  Labs:   Lab Results  Component Value Date   WBC 13.8 (H) 01/14/2019   HGB 8.6 (L) 01/14/2019   HCT 27.2 (L) 01/14/2019   MCV 96.1 01/14/2019   PLT 293 01/14/2019    Recent Labs  Lab 01/14/2019 1430 01/14/19 0449  NA 133* 135  K 4.6 3.9  CL 93* 98  CO2 24 23  BUN  71* 84*  CREATININE 3.20* 3.29*  CALCIUM 8.7* 8.1*  PROT 5.6*  --   BILITOT 0.8  --   ALKPHOS 62  --   ALT 13  --   AST 17  --   GLUCOSE 137* 125*   Lab Results  Component Value Date   CKTOTAL 60 04/09/2008   CKMB 0.9 04/09/2008   TROPONINI <0.03 02/19/2018    Lab Results  Component Value Date   CHOL 230 (H) 12/02/2015   CHOL 171 09/08/2012   CHOL  04/09/2008    147        ATP III CLASSIFICATION:  <200     mg/dL   Desirable  200-239  mg/dL   Borderline High  >=240    mg/dL   High          Lab Results  Component Value Date   HDL 45 12/02/2015   HDL 42 09/08/2012   HDL 21 (L) 04/09/2008   Lab Results  Component Value Date   LDLCALC 150 (H) 12/02/2015   LDLCALC 89 09/08/2012   LDLCALC  04/09/2008    70        Total Cholesterol/HDL:CHD Risk Coronary Heart Disease Risk Table                     Men   Women  1/2 Average Risk   3.4   3.3  Average Risk       5.0   4.4  2 X Average Risk   9.6   7.1  3 X Average Risk  23.4   11.0        Use the calculated Patient Ratio above and the CHD Risk Table to determine the patient's CHD Risk.        ATP III CLASSIFICATION (LDL):  <100  mg/dL   Optimal  100-129  mg/dL   Near or Above                    Optimal  130-159  mg/dL   Borderline  160-189  mg/dL   High  >190     mg/dL   Very High   Lab Results  Component Value Date   TRIG 428 (H) 12/17/2018   TRIG 175 (H) 12/02/2015   TRIG 202 (A) 09/08/2012   Lab Results  Component Value Date   CHOLHDL 7.0 04/09/2008   No results found for: LDLDIRECT    Radiology: Ct Abdomen Pelvis Wo Contrast  Result Date: 01/20/2019 CLINICAL DATA:  Abdominal pain and lethargy with COVID-19 positivity EXAM: CT ABDOMEN AND PELVIS WITHOUT CONTRAST TECHNIQUE: Multidetector CT imaging of the abdomen and pelvis was performed following the standard protocol without IV contrast. COMPARISON:  None. FINDINGS: Lower chest: Patchy airspace opacities are noted in the bases consistent with the  patient's given clinical history. Hepatobiliary: Liver is well visualized and within normal limits. The gallbladder is well distended with multiple dependent gallstones. Mild pericholecystic inflammatory changes are noted which may be related to acute cholecystitis. This may also be compensatory secondary to the adjacent changes in the distal stomach/proximal duodenum. Pancreas: Fatty interdigitation of the pancreas is noted. Spleen: Spleen is within normal limits. Adrenals/Urinary Tract: Adrenal glands are unremarkable. Kidneys are well visualized bilaterally. Few small renal calculi are noted bilaterally without obstructive change. The ureters are within normal limits. Cystic lesions are noted bilaterally but incompletely evaluated on this exam due to the lack of IV contrast. The bladder is decompressed. Stomach/Bowel: Mild retained fecal material is noted within the colon without obstructive change. The appendix is within normal limits. Visualized small bowel is unremarkable. In the distal stomach there is perigastric inflammatory change and irregular air which extends to the margin of the distal gastric wall suggestive of ulceration. No definitive changes of perforation are noted at this time. Some inflammatory changes in the first portion of the duodenum are noted as well. Vascular/Lymphatic: Aortic atherosclerosis. No enlarged abdominal or pelvic lymph nodes. Reproductive: Uterus and bilateral adnexa are unremarkable. Other: No abdominal wall hernia or abnormality. No abdominopelvic ascites. Musculoskeletal: No acute or significant osseous findings. IMPRESSION: There are changes in the distal stomach/proximal duodenum with inflammatory change and areas of air which extend to the margin of the distal gastric wall both anteriorly and posteriorly. These are suspicious for areas of gastric ulceration without definitive perforation. Dilatation of the gallbladder with dependent gallstones and some mild Peri  cholecystic inflammatory change. This may be compensatory from the adjacent duodenum however the possibility of acute cholecystitis could not be totally excluded. Ultrasound may be helpful for further evaluation as clinically indicated. Airspace opacities in the bases bilaterally consistent with the given clinical history of COVID-19. Aortic Atherosclerosis (ICD10-I70.0). Electronically Signed   By: Inez Catalina M.D.   On: 01/28/2019 15:51   Dg Chest Port 1 View  Result Date: 12/17/2018 CLINICAL DATA:  Shortness of breath, hypoxia. EXAM: PORTABLE CHEST 1 VIEW COMPARISON:  11/30/2018 FINDINGS: Patient slightly rotated to the left. Lungs are hypoinflated and demonstrate bilateral patchy mixed interstitial airspace density. No definite effusion. Mild stable cardiomegaly. IMPRESSION: Hypoinflation with mild bilateral patchy mixed interstitial airspace density which may be due to infection. Electronically Signed   By: Marin Olp M.D.   On: 12/17/2018 08:54   US Abdomen Limited Ruq  Result Date: 01/27/2019 CLINICAL DATA:  85 year old  female with right upper quadrant abdominal pain. EXAM: ULTRASOUND ABDOMEN LIMITED RIGHT UPPER QUADRANT COMPARISON:  CT of the abdomen pelvis dated 02/03/2019. FINDINGS: Evaluation is limited due to patient's body habitus. Gallbladder: Small stones noted in the gallbladder. There is no gallbladder wall thickening or pericholecystic fluid. Negative sonographic Murphy's sign. Common bile duct: Diameter: 2 mm Liver: There is diffuse increased liver echogenicity most commonly seen in the setting of fatty infiltration. Superimposed inflammation or fibrosis is not excluded. Clinical correlation is recommended. Portal vein is patent on color Doppler imaging with normal direction of blood flow towards the liver. Other: None. IMPRESSION: 1. Cholelithiasis without sonographic evidence of acute cholecystitis. 2. Fatty liver. Electronically Signed   By: Anner Crete M.D.   On: 01/19/2019  16:51    EKG: A-fib, Rate of 99 BPM, normal axis, no significant ST-T wave change   ASSESSMENT AND PLAN:  Deborah [name] is a 85 year old female with a history significant for heart failure with preserved ejection fraction, HTN, HLD, T2DM, who presented to the ED yesterday for one week of abdominal pain. Workup consistent with sepsis secondary to UTI. Has received broad spectrum antibiotics. Patient incidentally found to be in new onset A-fib. No prior A-fib history. Rate has been controlled.   #New Onset Atrial fibrillation:  Patient found to be in A-fib per EKG and telemetry. No history of A-fib previously. Patient's CHADS-VASc score is 22 (age >58 - 2, female - 1, CHF - 1, HTN - 1, DM - 1). Heart rate is controlled. Patient's A-fib may be secondary to reversible cause given patient's presumed sepsis from UTI.  - Defer initiation of anticoagulation at this time. Further discussion to be had about anticoagulation in the outpatient setting.  - Consider extended holter monitoring in the outpatient setting to evaluate for ongoing A-fib outside of acute illness   - No indication for further rate control at this time - continue carvedilol at time of discharge - Outpatient follow up with Dr. Ubaldo Glassing or Doristine Mango, PA-C in ~1 week following discharge  #Heart failure with preserved ejection fraction:  Patient with known HFpEF. Last ECHO completed 02/2018 with EF of 60-65%. She has grade 2 diastolic dysfunction. BNP normal on ER workup. No evidence of pleural effusion seen on CXR. Patient does not appear fluid overload - do not suspect CHF exacerbation at this time.  - resume beta blocker, and oral diuretic when able   #Elevated troponin:  Patient's troponin trend 24 > 30. This is a minimal troponin elevation. This is reduced as compared to troponin from an admission ~1 month ago. She has no chest pain. Suspect minimal troponin elevation is secondary to demand ischemia. Low suspicion for ACS at this time.   - No further cardiac interventions or diagnostics indicated at this time   The patient's history and exam findings were discussed with Dr. Nehemiah Massed. The plan was made in conjunction with Dr. Nehemiah Massed.  Signed: Hilbert Odor PA-C 01/14/2019, 11:07 AM  The patient has been interviewed and examined. I agree with assessment and plan above. Serafina Royals MD [facility]

## 2019-01-14 NOTE — Progress Notes (Signed)
Dr. Was called about patient Heart rate going to 140's but not sustaining. New orders given, will contiue to monitor.

## 2019-01-14 NOTE — Progress Notes (Addendum)
    BRIEF OVERNIGHT PROGRESS REPORT   SUBJECTIVE: Notified by primary nurse of persistent hypotension and afib without RVR.  OBJECTIVE: Patient evaluated at the bedside, she was afebrile with blood pressure 89/51(63) mm Hg and pulse rate 84 beats/min. There were no focal neurological deficits; she was alert and oriented x2.  EKG: Reviewed previous tracings, atrial fibrillation, rate 99 bpm.  ASSESSMENT:85 y.o. female with medical history significant for chronic diastolic CHF, hypertension, type 2 diabetes mellitus, dementia, debility, morbid obesity, anxiety, CKD stage III, recent discharge (on 12/23/2018) from [facility] after hospitalization for COVID-19 pneumonia, recent Klebsiella UTI presenting with abdominal pain  PLAN:  1. Sepsis - Secondary to UTI  -Trending Lactic acid - Recent COVID illness - Will check urine culture - Blood cultures pending - Continue Empiric abx with Zosyn pending cultures as above - IVFs and PRN bolus to keep MAP<64mHg or SBP <939mg, monitor for fluid overload given hx of CHF  2. New onset Atrial Fibrillation  - EKG+Telemetry - Troponin trending down - TSH, FT4 # Thromboembolism Risk Management CHADS2 = 6, she is at high risk for thromboembolic event - Will check Echocardiogram - Cardiology consult placed to Dr. KoNehemiah MassedPatient [facility]  3. Chronic Diastolic Congestive Heart Failure:     Last Echo 02/20/2018 , EF 60-65% - Hold Torsemide, coreg and Aldactone given worsening renal function and hypotension - Low salt diet  - Strict I&Os - Cardiology Consult  4. AKI on CKD- BUN/Cr elevated above baseline - Holding nephrotoxins - Monitor BMP+mag  5. HTN - Now hypotensive + Goal BP <130/80 - Hold BP meds  6. Gastric Ulcer - Continues to c/o intermittent abdominal pain - Continue Protonix gtt - Consider GI consult given no evidence of perforation may need further work up possible Endoscopy?   7. Diabetes Mellitus Type 2  - Recent HgbA1c 7.5.  - Hold Glipizide - CBG monitoring - SSI - Diabetes coordinator to follow  8. OSA - on CPAP at home - Will place on nocturnal CPAP during this hospitalization   DVT prophylaxis - Heparin SubQ    ElRufina FalcoDNP, CCRN, FNP-C [facility] Nurse Practitioner Between 7pm to 7aTipt[place] Pager - [phone] 7am go to [email or web address] - password:[email or web address] select [facility] CenterTriad [phone]

## 2019-01-14 NOTE — Progress Notes (Signed)
*  PRELIMINARY RESULTS* Echocardiogram 2D Echocardiogram has been performed.  Sherrie Sport 01/14/2019, 12:47 PM

## 2019-01-14 NOTE — Consult Note (Addendum)
[facility] GI Inpatient Consult Note   Kathline Magic, M.D.  Reason for Consult: Abdominal pain, Large gastric ulcer on CT   Attending Requesting Consult: Thornell Mule, M.D.   History of Present Illness: Deborah [name] is a 85 y.o. female admitted to [facility] early this AM with abdominal pain, nausea, and elevated troponin level as well as a UTI. Patient was recently discharged to [facility] facility after spending over 20 days at [facility] for a POSITIVE Covid test.  Patient reportedly had abdominal pain with hypotensive and acute renal insufficiency. Patient is somnolent and demented, but husband is at bedside and gives all of recent history. The rest was gleaned from chart review. Patient noted to also have cholelithiasis without evidence of cholecystitis. Lactic acid of 3.1 was discovered. Cardiology saw the patient early this AM and noted the troponin level was spuriously elevated and was not accompanied by symptoms of ACS, so no medical or procedural interventions were advised.  There has been difficulty, according to the patient's husband, for the patient to eat and does not appear to desire food secondary to abdominal pain. There has been no observed or reported instance of melena, hemetemesis or hematochezia. Patient has reported remote history of colonoscopy 10 or 20 years ago which was considered normal, though it was listed that Ulcerative Colitis was on the patient's past medical history. Patient's husband, Herbie Baltimore, denies the patient has ever undergone treatment for UC and has not had any recurrent bouts of diarrhea, weight loss or previous bowel surgeries. General surgery saw and evaluated the patient who was apparently tender without peritoneal signs. Surgical management was not deemed necessary.   Past Medical History:  Past Medical History:  Diagnosis Date  . 'light-for-dates' infant with signs of fetal malnutrition   . Anxiety   .  Chronic kidney disease, stage III (moderate)   . Contracture of palmar fascia   . Cough   . Diabetes mellitus 2019   all diabetic meds stopped upon arrival to [facility]  . Hyperlipidemia   . Hypertension   . Lower extremity edema   . Obesity   . Osteoarthritis   . Other specified acquired hypothyroidism   . Otitis externa   . Palpitations   . Peripheral neuropathy   . Pure hypercholesterolemia   . Secondary hyperparathyroidism (of renal origin)   . Sleep apnea   . Ulcerative colitis ([place])   . Unspecified adjustment reaction   . Unspecified hereditary and idiopathic peripheral neuropathy   . Unspecified urinary incontinence     Problem List: Patient Active Problem List   Diagnosis Date Noted  . Pressure injury of skin 01/14/2019  . RUQ abdominal pain   . Acute lower UTI 02/08/2019  . Hypotension 02/11/2019  . Pneumonia due to COVID-19 virus 12/17/2018  . AKI (acute kidney injury) ([place]) 12/17/2018  . Pneumonia 02/19/2018  . Benign neoplasm of cecum   . Benign neoplasm of transverse colon   . Lewy body dementia without behavioral disturbance ([place])   . Palliative care by specialist   . DNR (do not resuscitate)   . Goals of care, counseling/discussion   . Sepsis ([place]) 02/24/2016  . Urinary tract infection without hematuria 02/24/2016  . Memory loss 07/18/2015  . Depression 01/12/2015  . Shoulder pain 12/17/2014  . Pain in shoulder 08/05/2014  . Gonalgia 07/21/2014  . History of knee surgery 07/21/2014  . Allergic rhinitis 07/16/2014  . Anxiety 07/16/2014  . Benign hypertension 07/16/2014  .  Alteration in bowel elimination: incontinence 07/16/2014  . Chronic kidney disease (CKD), stage III (moderate) 07/16/2014  . Colitis 07/16/2014  . CN (constipation) 07/16/2014  . Diarrhea 07/16/2014  . Contracture of palmar fascia (Dupuytren's) 07/16/2014  . Hypercholesteremia 07/16/2014  . Hypoxia 07/16/2014  . Absence of bladder continence 07/16/2014  . Focal lymphocytic  colitis 07/16/2014  . Extreme obesity 07/16/2014  . Arthritis, degenerative 07/16/2014  . Disorder of peripheral nervous system 07/16/2014  . Neuralgia neuritis, sciatic nerve 07/16/2014  . Hyperparathyroidism, secondary ([place]) 07/16/2014  . Apnea, sleep 07/16/2014  . Subclinical hypothyroidism 07/16/2014  . Controlled type 2 diabetes mellitus ([place]) 07/16/2014  . 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 Urinary incontinence without sensory awareness 09/03/2012  . Palpitations 12/28/2010  . SOB (shortness of breath) 12/28/2010  . Overweight 08/02/2009    Past Surgical History: Past Surgical History:  Procedure Laterality Date  . BACK SURGERY  1990   Ruptured Disc.  Marland Kitchen CARDIAC CATHETERIZATION  03/2008   due to chest pain  . COLONOSCOPY    . COLONOSCOPY WITH PROPOFOL N/A 10/02/2016   Procedure: COLONOSCOPY WITH PROPOFOL;  Surgeon: Lucilla Lame, MD;  Location: [facility] ENDOSCOPY;  Service: Endoscopy;  Laterality: N/A;  . DUPUYTREN CONTRACTURE RELEASE Left 05/27/2017   Procedure: DUPUYTREN CONTRACTURE RELEASE-LEFT INDEX AND PINKY FINGER;  Surgeon: Leanor Kail, MD;  Location: [facility] ORS;  Service: Orthopedics;  Laterality: Left;  . EYE SURGERY Bilateral 2014   cataract extractions with IOL implant  . HAND SURGERY Right 1999   dupeytren release  . REPLACEMENT TOTAL KNEE Bilateral 2004, 1999  . SHOULDER SURGERY Right 06/2007   rotator cuff repair    Allergies: Allergies  Allergen Reactions  . Ace Inhibitors Cough    Home Medications: Medications Prior to Admission  Medication Sig Dispense Refill Last Dose  . acetaminophen (TYLENOL) 500 MG tablet Take 500 mg by mouth every 6 (six) hours as needed for mild pain.    Unknown at PRN  . benzocaine (ORAJEL) 10 % mucosal gel Use as directed 1 application in the mouth or throat every 6 (six) hours as needed (cold sores).   Unknown at PRN  . carvedilol (COREG) 3.125 MG tablet Take 1 tablet (3.125 mg total) by mouth 2 (two) times daily. 60 tablet 11  02/06/2019 at 0800  . Cranberry 425 MG CAPS Take 425 mg by mouth daily.    01/29/2019 at 0800  . escitalopram (LEXAPRO) 10 MG tablet Take 5 mg by mouth daily.    02/05/2019 at 0800  . glipiZIDE (GLUCOTROL) 5 MG tablet Take 2.5 mg by mouth 2 (two) times daily before a meal.    01/23/2019 at 0800  . guaifenesin (ROBITUSSIN) 100 MG/5ML syrup Take 200 mg by mouth every 4 (four) hours as needed for cough.    Unknown at PRN  . HYDROcodone-acetaminophen (NORCO/VICODIN) 5-325 MG tablet Take 1-2 tablets by mouth every 6 (six) hours as needed for moderate pain or severe pain.   Unknown at PRN  . ipratropium-albuterol (DUONEB) 0.5-2.5 (3) MG/3ML SOLN Take 3 mLs by nebulization every 8 (eight) hours as needed (shortness of breath).   Unknown at PRN  . lidocaine (XYLOCAINE) 2 % solution Use as directed 15 mLs in the mouth or throat every 6 (six) hours as needed for mouth pain.   Unknown at PRN  . loratadine (CLARITIN) 10 MG tablet Take 10 mg by mouth every evening.   01/12/2019 at 2000  . nystatin (MYCOSTATIN/NYSTOP) powder Apply topically under pannus and underbilateral breast twice daily  as needed for yeast rash   Unknown at PRN  . omeprazole (PRILOSEC) 20 MG capsule Take 20 mg by mouth daily.   01/16/2019 at 0800  . ondansetron (ZOFRAN) 4 MG tablet Take 4 mg by mouth every 8 (eight) hours as needed for nausea or vomiting.   Unknown at PRN  . torsemide (DEMADEX) 20 MG tablet Take 40 mg by mouth daily as needed (increased edema).    Unknown at PRN  . triamcinolone lotion (KENALOG) 0.1 % Apply 1 application topically 3 (three) times daily as needed (stasis dermatitis). (apply to bilateral calves)   Unknown at PRN  . zinc oxide 20 % ointment Apply 1 application topically as needed for irritation. (apply to buttocks)   Unknown at PRN   Home medication reconciliation was completed with the patient.   Scheduled Inpatient Medications:   . budesonide  3 mg Oral Daily  . escitalopram  10 mg Oral Daily  . heparin  5,000  Units Subcutaneous Q8H  . pantoprazole (PROTONIX) IV  40 mg Intravenous Q12H  . simvastatin  20 mg Oral QHS  . sucralfate  1 g Oral TID WC & HS    Continuous Inpatient Infusions:   . sodium chloride 100 mL/hr at 01/14/19 1522  . piperacillin-tazobactam (ZOSYN)  IV Stopped (01/14/19 1314)    PRN Inpatient Medications:  acetaminophen **OR** acetaminophen, HYDROmorphone (DILAUDID) injection, ipratropium-albuterol, LORazepam  Family History: family history includes Alzheimer's disease in her mother; Heart attack in her father; Hypertension in her sister and sister.   GI Family History: Negative.  Social History:   reports that she quit smoking about 21 years ago. Her smoking use included cigarettes. She has a 10.00 pack-year smoking history. She has never used smokeless tobacco. She reports that she does not drink alcohol or use drugs. The patient denies ETOH, tobacco, or drug use.    Review of Systems: Review of Systems - Unobtainable as patient appears obtunded and sedated  Physical Examination: BP (!) 108/51 (BP Location: Left Wrist)   Pulse 70   Temp 98.4 F (36.9 C) (Oral)   Resp 19   Ht 5' 8"  (1.727 m)   Wt (!) 136.7 kg   SpO2 96%   BMI 45.81 kg/m  Physical Exam Constitutional:      General: She is not in acute distress.    Appearance: She is obese. She is ill-appearing and toxic-appearing.  HENT:     Head: Normocephalic.     Nose: Nose normal.  Eyes:     General:        Right eye: No discharge.        Left eye: No discharge.     Pupils: Pupils are equal, round, and reactive to light.  Cardiovascular:     Rate and Rhythm: Tachycardia present.     Pulses: Normal pulses.  Pulmonary:     Effort: No respiratory distress.     Breath sounds: No wheezing.  Abdominal:     General: There is distension.     Palpations: There is no mass.     Tenderness: There is abdominal tenderness. There is no right CVA tenderness, guarding or rebound.     Hernia: No hernia is  present.  Skin:    General: Skin is warm and dry.  Psychiatric:        Attention and Perception: She is inattentive.        Speech: She is noncommunicative.        Behavior: Behavior is not agitated.  Comments: Patient sedated with narcotics for pain management     Data: Lab Results  Component Value Date   WBC 13.8 (H) 01/14/2019   HGB 8.6 (L) 01/14/2019   HCT 27.2 (L) 01/14/2019   MCV 96.1 01/14/2019   PLT 293 01/14/2019   Recent Labs  Lab 01/26/2019 1430 01/14/19 0449  HGB 10.1* 8.6*   Lab Results  Component Value Date   NA 135 01/14/2019   K 3.9 01/14/2019   CL 98 01/14/2019   CO2 23 01/14/2019   BUN 84 (H) 01/14/2019   CREATININE 3.29 (H) 01/14/2019   GLU 107 01/03/2016   Lab Results  Component Value Date   ALT 13 02/06/2019   AST 17 02/03/2019   ALKPHOS 62 02/10/2019   BILITOT 0.8 01/21/2019   No results for input(s): APTT, INR, PTT in the last 168 hours. CBC Latest Ref Rng & Units 01/14/2019 02/04/2019 12/22/2018  WBC 4.0 - 10.5 K/uL 13.8(H) 17.9(H) 6.7  Hemoglobin 12.0 - 15.0 g/dL 8.6(L) 10.1(L) 12.8  Hematocrit 36.0 - 46.0 % 27.2(L) 32.1(L) 41.2  Platelets 150 - 400 K/uL 293 385 92(L)    STUDIES: Ct Abdomen Pelvis Wo Contrast  Result Date: 02/04/2019 CLINICAL DATA:  Abdominal pain and lethargy with COVID-19 positivity EXAM: CT ABDOMEN AND PELVIS WITHOUT CONTRAST TECHNIQUE: Multidetector CT imaging of the abdomen and pelvis was performed following the standard protocol without IV contrast. COMPARISON:  None. FINDINGS: Lower chest: Patchy airspace opacities are noted in the bases consistent with the patient's given clinical history. Hepatobiliary: Liver is well visualized and within normal limits. The gallbladder is well distended with multiple dependent gallstones. Mild pericholecystic inflammatory changes are noted which may be related to acute cholecystitis. This may also be compensatory secondary to the adjacent changes in the distal stomach/proximal  duodenum. Pancreas: Fatty interdigitation of the pancreas is noted. Spleen: Spleen is within normal limits. Adrenals/Urinary Tract: Adrenal glands are unremarkable. Kidneys are well visualized bilaterally. Few small renal calculi are noted bilaterally without obstructive change. The ureters are within normal limits. Cystic lesions are noted bilaterally but incompletely evaluated on this exam due to the lack of IV contrast. The bladder is decompressed. Stomach/Bowel: Mild retained fecal material is noted within the colon without obstructive change. The appendix is within normal limits. Visualized small bowel is unremarkable. In the distal stomach there is perigastric inflammatory change and irregular air which extends to the margin of the distal gastric wall suggestive of ulceration. No definitive changes of perforation are noted at this time. Some inflammatory changes in the first portion of the duodenum are noted as well. Vascular/Lymphatic: Aortic atherosclerosis. No enlarged abdominal or pelvic lymph nodes. Reproductive: Uterus and bilateral adnexa are unremarkable. Other: No abdominal wall hernia or abnormality. No abdominopelvic ascites. Musculoskeletal: No acute or significant osseous findings. IMPRESSION: There are changes in the distal stomach/proximal duodenum with inflammatory change and areas of air which extend to the margin of the distal gastric wall both anteriorly and posteriorly. These are suspicious for areas of gastric ulceration without definitive perforation. Dilatation of the gallbladder with dependent gallstones and some mild Peri cholecystic inflammatory change. This may be compensatory from the adjacent duodenum however the possibility of acute cholecystitis could not be totally excluded. Ultrasound may be helpful for further evaluation as clinically indicated. Airspace opacities in the bases bilaterally consistent with the given clinical history of COVID-19. Aortic Atherosclerosis  (ICD10-I70.0). Electronically Signed   By: Inez Catalina M.D.   On: 02/05/2019 15:51   US Abdomen Limited  Ruq  Result Date: 02/08/2019 CLINICAL DATA:  86 year old female with right upper quadrant abdominal pain. EXAM: ULTRASOUND ABDOMEN LIMITED RIGHT UPPER QUADRANT COMPARISON:  CT of the abdomen pelvis dated 01/14/2019. FINDINGS: Evaluation is limited due to patient's body habitus. Gallbladder: Small stones noted in the gallbladder. There is no gallbladder wall thickening or pericholecystic fluid. Negative sonographic Murphy's sign. Common bile duct: Diameter: 2 mm Liver: There is diffuse increased liver echogenicity most commonly seen in the setting of fatty infiltration. Superimposed inflammation or fibrosis is not excluded. Clinical correlation is recommended. Portal vein is patent on color Doppler imaging with normal direction of blood flow towards the liver. Other: None. IMPRESSION: 1. Cholelithiasis without sonographic evidence of acute cholecystitis. 2. Fatty liver. Electronically Signed   By: Anner Crete M.D.   On: 02/11/2019 16:51   @IMAGES @  Assessment: 1. Abdominal pain - Possibly secondary to GU which did not appear perforated on CT. DDx includes PUD, malignancy, ischemic gastritis/enteritis. 2. Lactic acidemia - DDx includes sepsis, bowel ischemia, heart failure. 2D echo is pending. 3. Dementia. 4. Feeding problem with vomiting. 5. Acute renal insufficiency 6. DNR status.  COVID-19 status:    Not tested/Low or no suspicion for COVID-19   Recommendations: 1. IV fluids, IV antibiotics. 2. IV acid suppression. 3. Gentle EGD despite relative contraindication. Exact management including medications and nutritional options will be need to be guided by endoscopic findings.  I discussed the higher risk of endoscopy in this setting with the husband, Herbie Baltimore, who understands the nature of the planned procedure, indications, risks, alternatives and potential complications including but  not limited to bleeding, infection, perforation, damage to internal organs and possible oversedation/side effects from anesthesia. Cheral Bay, the patient's husband, agrees and gives conditional consent to proceed after speaking with his two sons.  Please refer to procedure notes for findings, recommendations and patient disposition/instructions.   Thank you for the consult. Please call with questions or concerns.  Olean Ree, "Lanny Hurst MD [facility] [place], [place] [address] [phone]  01/14/2019 7:12 PM

## 2019-01-14 NOTE — TOC Initial Note (Signed)
Transition of Care [facility]) - Initial/Assessment Note    Patient Details  Name: Deborah [name] MRN: [id number] Date of Birth: Aug 31, 1930  Transition of Care [facility]) CM/SW Contact:    Candie Chroman, LCSW Phone Number: 01/14/2019, 11:51 AM  Clinical Narrative: Patient sleeping and not fully oriented. Husband at bedside. CSW introduced role and explained that discharge planning would be discussed. Patient is from [facility] on the long-term SNF side. She has been there about 2 years. Her husband lives at home. Plan if for her to return to [facility] at discharge. No further concerns. CSW encouraged patient's husband to contact CSW as needed. CSW will continue to follow patient and her husband for support and facilitate return to SNF once medically stable.           Expected Discharge Plan: Skilled Nursing Facility Barriers to Discharge: Continued Medical Work up   Patient Goals and CMS Choice Patient states their goals for this hospitalization and ongoing recovery are:: Patient not fully oriented.   Choice offered to / list presented to : NA  Expected Discharge Plan and Services Expected Discharge Plan: [place] Choice: [place] Living arrangements for the past 2 months: [place]                                      Prior Living Arrangements/Services Living arrangements for the past 2 months: [place] Lives with:: Facility Resident Patient language and need for interpreter reviewed:: Yes Do you feel safe going back to the place where you live?: Yes      Need for Family Participation in Patient Care: Yes (Comment) Care giver support system in place?: Yes (comment)   Criminal Activity/Legal Involvement Pertinent to Current Situation/Hospitalization: No - Comment as needed  Activities of Daily Living Home Assistive Devices/Equipment: Gilford Rile (specify type) ADL Screening (condition at time of  admission) Patient's cognitive ability adequate to safely complete daily activities?: Yes Is the patient deaf or have difficulty hearing?: No Does the patient have difficulty seeing, even when wearing glasses/contacts?: No Does the patient have difficulty concentrating, remembering, or making decisions?: No Patient able to express need for assistance with ADLs?: Yes Does the patient have difficulty dressing or bathing?: Yes Independently performs ADLs?: No Communication: Independent Dressing (OT): Needs assistance Is this a change from baseline?: Pre-admission baseline Grooming: Needs assistance Is this a change from baseline?: Pre-admission baseline Feeding: Independent Bathing: Needs assistance Is this a change from baseline?: Pre-admission baseline Toileting: Needs assistance Is this a change from baseline?: Pre-admission baseline In/Out Bed: Needs assistance Is this a change from baseline?: Pre-admission baseline Walks in Home: Needs assistance Is this a change from baseline?: Pre-admission baseline Does the patient have difficulty walking or climbing stairs?: Yes Weakness of Legs: Both Weakness of Arms/Hands: Both  Permission Sought/Granted Permission sought to share information with : Facility Sport and exercise psychologist, Family Supports    Share Information with NAME: Nicholette Dolson  Permission granted to share info w AGENCY: [place] granted to share info w Relationship: Husband  Permission granted to share info w Contact Information: [phone]  Emotional Assessment Appearance:: Appears stated age Attitude/Demeanor/Rapport: Unable to Assess Affect (typically observed): Unable to Assess Orientation: : Oriented to Self, Oriented to Place Alcohol / Substance Use: Not Applicable Psych Involvement: No (comment)  Admission diagnosis:  Weakness [R53.1] RUQ abdominal  pain [R10.11] AKI (acute kidney injury) ([place]) [N17.9] Sepsis ([place]) [A41.9] Patient Active  Problem List   Diagnosis Date Noted  . Pressure injury of skin 01/14/2019  . Acute lower UTI 02/03/2019  . Hypotension 01/23/2019  . Pneumonia due to COVID-19 virus 12/17/2018  . AKI (acute kidney injury) ([place]) 12/17/2018  . Pneumonia 02/19/2018  . Benign neoplasm of cecum   . Benign neoplasm of transverse colon   . Lewy body dementia without behavioral disturbance ([place])   . Palliative care by specialist   . DNR (do not resuscitate)   . Goals of care, counseling/discussion   . Sepsis ([place]) 02/24/2016  . Urinary tract infection without hematuria 02/24/2016  . Memory loss 07/18/2015  . Depression 01/12/2015  . Shoulder pain 12/17/2014  . Pain in shoulder 08/05/2014  . Gonalgia 07/21/2014  . History of knee surgery 07/21/2014  . Allergic rhinitis 07/16/2014  . Anxiety 07/16/2014  . Benign hypertension 07/16/2014  . Alteration in bowel elimination: incontinence 07/16/2014  . Chronic kidney disease (CKD), stage III (moderate) 07/16/2014  . Colitis 07/16/2014  . CN (constipation) 07/16/2014  . Diarrhea 07/16/2014  . Contracture of palmar fascia (Dupuytren's) 07/16/2014  . Hypercholesteremia 07/16/2014  . Hypoxia 07/16/2014  . Absence of bladder continence 07/16/2014  . Focal lymphocytic colitis 07/16/2014  . Extreme obesity 07/16/2014  . Arthritis, degenerative 07/16/2014  . Disorder of peripheral nervous system 07/16/2014  . Neuralgia neuritis, sciatic nerve 07/16/2014  . Hyperparathyroidism, secondary ([place]) 07/16/2014  . Apnea, sleep 07/16/2014  . Subclinical hypothyroidism 07/16/2014  . Controlled type 2 diabetes mellitus ([place]) 07/16/2014  . 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 Urinary incontinence without sensory awareness 09/03/2012  . Palpitations 12/28/2010  . SOB (shortness of breath) 12/28/2010  . Overweight 08/02/2009   PCP:  Venia Carbon, MD Pharmacy:   [place], [place] - [place] [address]  [place] [address] Phone: [phone] Fax: [phone]     Social Determinants of Health (SDOH) Interventions    Readmission Risk Interventions No flowsheet data found.

## 2019-01-14 NOTE — NC FL2 (Signed)
[place] LEVEL OF CARE SCREENING TOOL     IDENTIFICATION  Patient Name: Deborah [name] Birthdate: October 28, 1930 Sex: female Admission Date (Current Location): 01/23/2019  [place] and [place] Number:  Engineering geologist and Address:  [facility], [address] [place], [place] [address]      Provider Number: [id number]  Attending Physician Name and Address:  Thornell Mule, MD  Relative Name and Phone Number:       Current Level of Care: Hospital Recommended Level of Care: [place] Prior Approval Number:    Date Approved/Denied:   PASRR Number: [id number]  Discharge Plan: SNF    Current Diagnoses: Patient Active Problem List   Diagnosis Date Noted  . Pressure injury of skin 01/14/2019  . Acute lower UTI 02/10/2019  . Hypotension 01/25/2019  . Pneumonia due to COVID-19 virus 12/17/2018  . AKI (acute kidney injury) ([place]) 12/17/2018  . Pneumonia 02/19/2018  . Benign neoplasm of cecum   . Benign neoplasm of transverse colon   . Lewy body dementia without behavioral disturbance ([place])   . Palliative care by specialist   . DNR (do not resuscitate)   . Goals of care, counseling/discussion   . Sepsis ([place]) 02/24/2016  . Urinary tract infection without hematuria 02/24/2016  . Memory loss 07/18/2015  . Depression 01/12/2015  . Shoulder pain 12/17/2014  . Pain in shoulder 08/05/2014  . Gonalgia 07/21/2014  . History of knee surgery 07/21/2014  . Allergic rhinitis 07/16/2014  . Anxiety 07/16/2014  . Benign hypertension 07/16/2014  . Alteration in bowel elimination: incontinence 07/16/2014  . Chronic kidney disease (CKD), stage III (moderate) 07/16/2014  . Colitis 07/16/2014  . CN (constipation) 07/16/2014  . Diarrhea 07/16/2014  . Contracture of palmar fascia (Dupuytren's) 07/16/2014  . Hypercholesteremia 07/16/2014  . Hypoxia 07/16/2014  . Absence of bladder continence 07/16/2014  . Focal lymphocytic colitis  07/16/2014  . Extreme obesity 07/16/2014  . Arthritis, degenerative 07/16/2014  . Disorder of peripheral nervous system 07/16/2014  . Neuralgia neuritis, sciatic nerve 07/16/2014  . Hyperparathyroidism, secondary ([place]) 07/16/2014  . Apnea, sleep 07/16/2014  . Subclinical hypothyroidism 07/16/2014  . Controlled type 2 diabetes mellitus ([place]) 07/16/2014  . 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 Urinary incontinence without sensory awareness 09/03/2012  . Palpitations 12/28/2010  . SOB (shortness of breath) 12/28/2010  . Overweight 08/02/2009    Orientation RESPIRATION BLADDER Height & Weight     Self, Time, Situation, Place  O2(Nasal Canula 2 L) Incontinent, External catheter Weight: (!) 301 lb 4.8 oz (136.7 kg) Height:  5' 8"  (172.7 cm)  BEHAVIORAL SYMPTOMS/MOOD NEUROLOGICAL BOWEL NUTRITION STATUS  (None) (None) Continent Diet(Carb modified)  AMBULATORY STATUS COMMUNICATION OF NEEDS Skin       Skin abrasions, Bruising, Other (Comment), PU Stage and Appropriate Care(MASD, Skin tear.)   PU Stage 2 Dressing: (Sacrum: Foam (lift dressing every shift to assess).) PU Stage 3 Dressing: (Mid sacrum: Foam (lift to assess every shift).)                 Personal Care Assistance Level of Assistance              Functional Limitations Info  Sight, Hearing, Speech Sight Info: Adequate Hearing Info: Adequate Speech Info: Adequate    SPECIAL CARE FACTORS FREQUENCY                       Contractures Contractures Info: Not present    Additional Factors Info  Code Status, Allergies Code Status Info: DNR Allergies Info: Ace Inhibitors           Current Medications (01/14/2019):  This is the current hospital active medication list Current Facility-Administered Medications  Medication Dose Route Frequency Provider Last Rate Last Dose  . 0.9 %  sodium chloride infusion   Intravenous Continuous Jennye Boroughs, MD 100 mL/hr at 01/14/19 0320    . acetaminophen (TYLENOL) tablet  650 mg  650 mg Oral Q6H PRN Jennye Boroughs, MD       Or  . acetaminophen (TYLENOL) suppository 650 mg  650 mg Rectal Q6H PRN Jennye Boroughs, MD   650 mg at 01/14/2019 1956  . budesonide (ENTOCORT EC) 24 hr capsule 3 mg  3 mg Oral Daily Jennye Boroughs, MD   3 mg at 01/14/19 0943  . escitalopram (LEXAPRO) tablet 10 mg  10 mg Oral Daily Jennye Boroughs, MD   10 mg at 01/14/19 0943  . heparin injection 5,000 Units  5,000 Units Subcutaneous Q8H Jennye Boroughs, MD   5,000 Units at 01/14/19 [phone]  . ipratropium-albuterol (DUONEB) 0.5-2.5 (3) MG/3ML nebulizer solution 3 mL  3 mL Nebulization Q8H PRN Jennye Boroughs, MD      . LORazepam (ATIVAN) tablet 0.5 mg  0.5 mg Oral Q12H PRN Jennye Boroughs, MD      . pantoprazole (PROTONIX) 80 mg in sodium chloride 0.9 % 250 mL (0.32 mg/mL) infusion  8 mg/hr Intravenous Continuous Jennye Boroughs, MD   Stopped at 01/14/19 0945  . piperacillin-tazobactam (ZOSYN) IVPB 3.375 g  3.375 g Intravenous Q12H Jennye Boroughs, MD 12.5 mL/hr at 01/14/19 0943 3.375 g at 01/14/19 0943  . simvastatin (ZOCOR) tablet 20 mg  20 mg Oral QHS Jennye Boroughs, MD   Stopped at 02/07/2019 2123     Discharge Medications: Please see discharge summary for a list of discharge medications.  Relevant Imaging Results:  Relevant Lab Results:   Additional Information SS#: [id number]  Candie Chroman, LCSW

## 2019-01-15 DIAGNOSIS — N179 Acute kidney failure, unspecified: Secondary | ICD-10-CM

## 2019-01-15 DIAGNOSIS — I469 Cardiac arrest, cause unspecified: Secondary | ICD-10-CM

## 2019-01-15 DIAGNOSIS — I4891 Unspecified atrial fibrillation: Secondary | ICD-10-CM

## 2019-01-15 DIAGNOSIS — R6521 Severe sepsis with septic shock: Secondary | ICD-10-CM

## 2019-01-15 DIAGNOSIS — K253 Acute gastric ulcer without hemorrhage or perforation: Secondary | ICD-10-CM

## 2019-01-15 DIAGNOSIS — A419 Sepsis, unspecified organism: Principal | ICD-10-CM

## 2019-01-15 DIAGNOSIS — N390 Urinary tract infection, site not specified: Secondary | ICD-10-CM

## 2019-01-15 LAB — MAGNESIUM: Magnesium: 2.5 mg/dL — ABNORMAL HIGH (ref 1.7–2.4)

## 2019-01-15 LAB — POTASSIUM: Potassium: 4.4 mmol/L (ref 3.5–5.1)

## 2019-01-15 SURGERY — EGD (ESOPHAGOGASTRODUODENOSCOPY)
Anesthesia: Monitor Anesthesia Care

## 2019-01-15 MED ORDER — DILTIAZEM HCL 25 MG/5ML IV SOLN: 5.0000 mg | Freq: Once | INTRAVENOUS | Status: DC

## 2019-01-15 MED ORDER — SODIUM CHLORIDE 0.9 % IV BOLUS
250.0000 mL | Freq: Once | INTRAVENOUS | Status: AC
  Administered 2019-01-15: 250 mL via INTRAVENOUS

## 2019-01-16 LAB — URINE CULTURE: Culture: >=100000 — AB

## 2019-01-18 LAB — CULTURE, BLOOD (ROUTINE X 2)
Culture: NO GROWTH
Culture: NO GROWTH
Special Requests: ADEQUATE

## 2019-02-13 NOTE — Death Summary Note (Addendum)
[place] at [facility] Death summary:  PATIENT NAME: Deborah Lacap.  [name] MR#: [id number] DATE OF BIRTH: [birth year]  DATE OF ADMISSION: 2019/01/22  Date of death: 24-Jan-2019 Time of death: 02:35   Cause of death: Acute cardiorespiratory arrest likely secondary to septic shock.  Contributing factors:  UTI leading to sepsis and septic shock Acute kidney injury on stage IIIb chronic kidney disease Atrial fibrillation with rapid ventricular response that could have resulted in an embolic stroke and hypertensive urgency. Chronic diastolic CHF. Gastric ulcer  Other pertinent diagnoses: Anemia Obstructive sleep apnea History of COVID-19 pneumonia 4 weeks ago, recovered before admission. Type 2 diabetes mellitus Lewy body dementia Dyslipidemia Depression Urinary incontinence Morbid obesity  Hospital course  Brief Hx:  86 y.o.femalewith medical history significant for chronic diastolic CHF, hypertension, type 2 diabetes mellitus, dementia, debility, morbid obesity, anxiety, CKD stage III, recent discharge (on 12/23/2018) from [facility] after hospitalization for COVID-19 pneumonia, recent Klebsiella UTI. She presented from the nursing home because of 1 week history of abdominal pain. Patient is in pain and she is unable to provide an adequate history. History was mainly obtained from her husband at the bedside. Apparently, patient has had intermittent abdominal pain for about a week now. She has also had poor oral intake and has not been eating or drinking anything. She has had significant nausea and has been spitting up a lotbut no actual vomiting. She also reports a 2 to 3-day history of watery diarrhea but lately she does not have much stools because there is nothing in her stomach. No reported fever or chills. No shortness of breath, chest pain, dizziness.. ED Course:The patient was found to be hypotensive and tachycardic in the emergency room.  Urinalysis is suggestive of UTI she has leukocytosis and acute kidney injury. CT scan abdomen and pelvis showed inflammatory changes with areas of airsuggestive of gastric ulceration without perforation. Abdominal ultrasound showed fatty liver and cholelithiasis without cholecystitis.Lactic acid was elevated at 3.1  The patient was admitted to a medically monitored bed and managed for the following problems as follows:  Sepsis and UTI: On admission - Secondary to UTI  - Started empiric IV Zosyn and IV fluids. (She was treated with IV Rocephin for Klebsiella UTI within the last month)  - follow-up urine and blood cultures - Repeat lactic acid   Hypotension: On admission  - Stabilizing now  - Patient received 1.5L  - Cont maintenance fluids with normal saline  Acute kidney injury on CKD stage IIIb:  - Treat with IV fluids. Monitor BMP. - May consult Nephrology if necessary   Gastric ulcer on CT scan:  - Was on Protonix dripp from admission; changed to iv BID for now  General surgeon, Dr. Dortha Kern already been consulted by ED physician, Dr. Quentin Cornwall; No surgical intervention at this time. Appreciate Surgery recs  -Started on sucralfate GI consulted/; Dr. Alice Reichert will follow the rec  Chronic diastolic CHF: No acute issue at this time - hold carvedilol, torsemide and Aldactone because of AKI and hypotension -Zoom point is stable -Follow echocardiogram  ?Atrial fibrillation on EKG: In the ER her EKG at the time of admission found to be in A. Fib -Rate controlled this morning -Echocardiogram done report pending -Cardiology consulted by Dr. Nehemiah Massed.  Deferred anticoagulation at this time. -Recommended to restart Coreg at the time of discharge and to "consider extended holter monitoring in the outpatient setting to evaluate for ongoing A-fib outside of acute illness." - No indication  for further rate control at this time - continue carvedilol at time of discharge.  Outpatient follow up with Dr. Ubaldo Glassing or Doristine Mango, PA-C in ~1 week following discharge.  -She had cardiology recommendation  Third troponin: This is likely due to demand ischemia from sepsis. -No further work-up recommended per cardiology  Anemia: Likely combination of anemia of chronic diseases macrocytic? -Hemoglobin this morning found to be 8.6 -Check stool occult blood --P -May check iron panel vitamin B12 and folate -We will follow GI rec as well   The patient had pain earlier this evening on 12/2 for which she was given 0.5 mg of IV Dilaudid.  She later was significantly hypertensive and tachycardic and was given 5 mg of IV Lopressor at 2345.  At 0200 on 12/3 she  dropped her systolic blood pressure to the 70s.  A 250 mL IV normal saline bolus was ordered and despite infusion the patient patient became pulseless at 02 35.  She had no carotid pulses on exam and no heart sounds.  Her pupils were dilated and fixed.  I pronounced her dead at 30 35.  DNR status was respected per the patient and the family's wishes.  I talked to her husband who came to see her.  I discussed all the possible causes of death in her case and her multiple comorbidities.  I answered all of his questions.  There was no request for autopsy per her husband.  The patient's body will be released to the funeral home of her husband's choice.  Her death certificate was signed as well.  Time spent in the discharge of this patient: 45-minutes

## 2019-02-13 NOTE — Progress Notes (Signed)
   02-06-2019 0200  Clinical Encounter Type  Visited With Health care provider  Visit Type Code  Referral From Nurse   Chaplain received a RR page for this patient. Upon arrival, this chaplain was informed that the patient was a DNR and had unfortunately passed away in the wake of the emergency. No family present at the moment, but staff will call or page when family arrives, if support desired.

## 2019-02-13 NOTE — Progress Notes (Signed)
Dr. Hulen Skains about patient BP 70/40. Bolus ordered, will continue to monitor.

## 2019-02-13 NOTE — Progress Notes (Signed)
   01/29/2019 0342  Clinical Encounter Type  Visited With Family;Health care provider  Visit Type Follow-up;Death  Spiritual Encounters  Spiritual Needs Emotional;Grief support  Stress Factors  Family Stress Factors Loss;Major life changes   Page received to support the husband and son of the patient in the wake of her unexpected death. Upon arrival, husband and son at the bedside. Both were understandably emotional about the sudden loss of their wife and mother. The patient's husband shared some of the patient's recent medical history, including her battle with COVID-19 and resulting hospitalization at [facility]. The patient's husband lamented the loss of his wife and the life they shared and built together for 36 years. The patient's husband shared stories of their devotion to one another, and some of their familial, religious, vocational, and personal histories. The patient's son shared that he is grieving, but is most concerned about his father in this difficult time. The family is awaiting the arrival of the patient's other son from [facility]. This chaplain provided support in the form of active and reflective listening, compassionate ministerial presence, and comfort.

## 2019-02-13 NOTE — Progress Notes (Signed)
Was at the telemetry monitor assessing patient heart rate. Notice patient heart was 72. I went in to check on patient and patient was pale. Dr.  and rapid was called patient went asystole. Patient was a DNR.

## 2019-02-13 DEATH — deceased
